# Patient Record
Sex: Male | Born: 1965 | Race: Black or African American | Hispanic: No | Marital: Single | Smoking: Never smoker
Health system: Southern US, Community
[De-identification: ages and names within clinical notes are randomized; demographics above are authoritative.]

## PROBLEM LIST (undated history)

## (undated) DIAGNOSIS — F1020 Alcohol dependence, uncomplicated: Secondary | ICD-10-CM

## (undated) DIAGNOSIS — M1990 Unspecified osteoarthritis, unspecified site: Secondary | ICD-10-CM

## (undated) DIAGNOSIS — F419 Anxiety disorder, unspecified: Secondary | ICD-10-CM

## (undated) DIAGNOSIS — F329 Major depressive disorder, single episode, unspecified: Secondary | ICD-10-CM

## (undated) DIAGNOSIS — K7460 Unspecified cirrhosis of liver: Secondary | ICD-10-CM

## (undated) DIAGNOSIS — K469 Unspecified abdominal hernia without obstruction or gangrene: Secondary | ICD-10-CM

## (undated) DIAGNOSIS — M4712 Other spondylosis with myelopathy, cervical region: Secondary | ICD-10-CM

## (undated) DIAGNOSIS — F32A Depression, unspecified: Secondary | ICD-10-CM

## (undated) HISTORY — DX: Unspecified osteoarthritis, unspecified site: M19.90

## (undated) HISTORY — PX: NECK SURGERY: SHX720

## (undated) HISTORY — DX: Anxiety disorder, unspecified: F41.9

## (undated) HISTORY — PX: LUNG SURGERY: SHX703

---
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
## 1998-03-10 ENCOUNTER — Emergency Department (HOSPITAL_COMMUNITY): Admission: EM | Admit: 1998-03-10 | Discharge: 1998-03-10 | Payer: Self-pay | Admitting: Emergency Medicine

## 1998-03-10 ENCOUNTER — Encounter: Payer: Self-pay | Admitting: Emergency Medicine

## 1998-03-15 ENCOUNTER — Emergency Department (HOSPITAL_COMMUNITY): Admission: EM | Admit: 1998-03-15 | Discharge: 1998-03-15 | Payer: Self-pay | Admitting: *Deleted

## 1998-07-28 ENCOUNTER — Encounter: Payer: Self-pay | Admitting: Emergency Medicine

## 1998-07-28 ENCOUNTER — Observation Stay (HOSPITAL_COMMUNITY): Admission: EM | Admit: 1998-07-28 | Discharge: 1998-07-28 | Payer: Self-pay | Admitting: Emergency Medicine

## 1998-09-09 ENCOUNTER — Encounter: Payer: Self-pay | Admitting: Emergency Medicine

## 1998-09-09 ENCOUNTER — Inpatient Hospital Stay (HOSPITAL_COMMUNITY): Admission: EM | Admit: 1998-09-09 | Discharge: 1998-09-10 | Payer: Self-pay | Admitting: Emergency Medicine

## 1998-09-10 ENCOUNTER — Encounter: Payer: Self-pay | Admitting: Oral & Maxillofacial Surgery

## 2001-01-21 ENCOUNTER — Encounter: Payer: Self-pay | Admitting: Emergency Medicine

## 2001-01-21 ENCOUNTER — Emergency Department (HOSPITAL_COMMUNITY): Admission: EM | Admit: 2001-01-21 | Discharge: 2001-01-21 | Payer: Self-pay | Admitting: Emergency Medicine

## 2001-09-11 ENCOUNTER — Encounter: Payer: Self-pay | Admitting: Emergency Medicine

## 2001-09-11 ENCOUNTER — Emergency Department (HOSPITAL_COMMUNITY): Admission: EM | Admit: 2001-09-11 | Discharge: 2001-09-11 | Payer: Self-pay | Admitting: Emergency Medicine

## 2003-08-04 ENCOUNTER — Emergency Department (HOSPITAL_COMMUNITY): Admission: EM | Admit: 2003-08-04 | Discharge: 2003-08-04 | Payer: Self-pay | Admitting: Emergency Medicine

## 2006-09-06 ENCOUNTER — Emergency Department (HOSPITAL_COMMUNITY): Admission: EM | Admit: 2006-09-06 | Discharge: 2006-09-06 | Payer: Self-pay | Admitting: Emergency Medicine

## 2012-09-26 ENCOUNTER — Encounter (HOSPITAL_COMMUNITY): Payer: Self-pay | Admitting: Emergency Medicine

## 2012-09-26 ENCOUNTER — Emergency Department (HOSPITAL_COMMUNITY)
Admission: EM | Admit: 2012-09-26 | Discharge: 2012-09-26 | Disposition: A | Discharge status: Home / Self Care | Payer: Self-pay | Attending: Emergency Medicine | Admitting: Emergency Medicine

## 2012-09-26 DIAGNOSIS — S51809A Unspecified open wound of unspecified forearm, initial encounter: Secondary | ICD-10-CM | POA: Insufficient documentation

## 2012-09-26 DIAGNOSIS — Y929 Unspecified place or not applicable: Secondary | ICD-10-CM | POA: Insufficient documentation

## 2012-09-26 DIAGNOSIS — W260XXA Contact with knife, initial encounter: Secondary | ICD-10-CM | POA: Insufficient documentation

## 2012-09-26 DIAGNOSIS — Z23 Encounter for immunization: Secondary | ICD-10-CM | POA: Insufficient documentation

## 2012-09-26 DIAGNOSIS — Y9389 Activity, other specified: Secondary | ICD-10-CM | POA: Insufficient documentation

## 2012-09-26 DIAGNOSIS — S51811A Laceration without foreign body of right forearm, initial encounter: Secondary | ICD-10-CM

## 2012-09-26 DIAGNOSIS — W261XXA Contact with sword or dagger, initial encounter: Secondary | ICD-10-CM | POA: Insufficient documentation

## 2012-09-26 DIAGNOSIS — F1010 Alcohol abuse, uncomplicated: Secondary | ICD-10-CM | POA: Insufficient documentation

## 2012-09-26 MED ORDER — TETANUS-DIPHTH-ACELL PERTUSSIS 5-2.5-18.5 LF-MCG/0.5 IM SUSP
0.5000 mL | Freq: Once | INTRAMUSCULAR | Status: AC
  Administered 2012-09-26: 0.5 mL via INTRAMUSCULAR
  Filled 2012-09-26: qty 0.5

## 2012-09-26 NOTE — ED Notes (Signed)
Suture cart at bedside. continued Pressure held to lac.

## 2012-09-26 NOTE — ED Notes (Signed)
Lab notified of need for blood
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
## 2013-01-04 ENCOUNTER — Emergency Department (HOSPITAL_COMMUNITY): Payer: Medicaid Other

## 2013-01-04 ENCOUNTER — Encounter (HOSPITAL_COMMUNITY): Payer: Self-pay

## 2013-01-04 ENCOUNTER — Inpatient Hospital Stay (HOSPITAL_COMMUNITY): Payer: Medicaid Other

## 2013-01-04 ENCOUNTER — Inpatient Hospital Stay (HOSPITAL_COMMUNITY)
Admission: EM | Admit: 2013-01-04 | Discharge: 2013-01-13 | DRG: 867 | Disposition: A | Discharge status: Home / Self Care | Payer: Medicaid Other | Attending: Internal Medicine | Admitting: Internal Medicine

## 2013-01-04 DIAGNOSIS — F1020 Alcohol dependence, uncomplicated: Secondary | ICD-10-CM | POA: Diagnosis present

## 2013-01-04 DIAGNOSIS — F1210 Cannabis abuse, uncomplicated: Secondary | ICD-10-CM | POA: Diagnosis present

## 2013-01-04 DIAGNOSIS — F10231 Alcohol dependence with withdrawal delirium: Secondary | ICD-10-CM | POA: Diagnosis present

## 2013-01-04 DIAGNOSIS — Z5900 Homelessness unspecified: Secondary | ICD-10-CM

## 2013-01-04 DIAGNOSIS — A029 Salmonella infection, unspecified: Principal | ICD-10-CM | POA: Diagnosis present

## 2013-01-04 DIAGNOSIS — K759 Inflammatory liver disease, unspecified: Secondary | ICD-10-CM | POA: Diagnosis present

## 2013-01-04 DIAGNOSIS — K7010 Alcoholic hepatitis without ascites: Secondary | ICD-10-CM | POA: Diagnosis present

## 2013-01-04 DIAGNOSIS — K861 Other chronic pancreatitis: Secondary | ICD-10-CM | POA: Diagnosis present

## 2013-01-04 DIAGNOSIS — K709 Alcoholic liver disease, unspecified: Secondary | ICD-10-CM

## 2013-01-04 DIAGNOSIS — E876 Hypokalemia: Secondary | ICD-10-CM | POA: Diagnosis present

## 2013-01-04 DIAGNOSIS — F1010 Alcohol abuse, uncomplicated: Secondary | ICD-10-CM | POA: Diagnosis present

## 2013-01-04 DIAGNOSIS — R7881 Bacteremia: Secondary | ICD-10-CM | POA: Diagnosis present

## 2013-01-04 DIAGNOSIS — R509 Fever, unspecified: Secondary | ICD-10-CM | POA: Diagnosis present

## 2013-01-04 DIAGNOSIS — D6959 Other secondary thrombocytopenia: Secondary | ICD-10-CM | POA: Diagnosis present

## 2013-01-04 DIAGNOSIS — E871 Hypo-osmolality and hyponatremia: Secondary | ICD-10-CM | POA: Diagnosis present

## 2013-01-04 DIAGNOSIS — W108XXA Fall (on) (from) other stairs and steps, initial encounter: Secondary | ICD-10-CM | POA: Diagnosis present

## 2013-01-04 DIAGNOSIS — K859 Acute pancreatitis without necrosis or infection, unspecified: Secondary | ICD-10-CM | POA: Diagnosis present

## 2013-01-04 DIAGNOSIS — F10931 Alcohol use, unspecified with withdrawal delirium: Secondary | ICD-10-CM | POA: Diagnosis present

## 2013-01-04 DIAGNOSIS — R6510 Systemic inflammatory response syndrome (SIRS) of non-infectious origin without acute organ dysfunction: Secondary | ICD-10-CM | POA: Diagnosis present

## 2013-01-04 DIAGNOSIS — K7460 Unspecified cirrhosis of liver: Secondary | ICD-10-CM | POA: Diagnosis present

## 2013-01-04 DIAGNOSIS — D696 Thrombocytopenia, unspecified: Secondary | ICD-10-CM | POA: Diagnosis present

## 2013-01-04 HISTORY — DX: Alcohol dependence, uncomplicated: F10.20

## 2013-01-04 LAB — RAPID URINE DRUG SCREEN, HOSP PERFORMED
Amphetamines: NOT DETECTED
Barbiturates: NOT DETECTED
Opiates: NOT DETECTED
Tetrahydrocannabinol: POSITIVE — AB

## 2013-01-04 LAB — URINALYSIS, ROUTINE W REFLEX MICROSCOPIC
Bilirubin Urine: NEGATIVE
Nitrite: NEGATIVE
Protein, ur: NEGATIVE mg/dL
Specific Gravity, Urine: 1.004 — ABNORMAL LOW (ref 1.005–1.030)
Urobilinogen, UA: 0.2 mg/dL (ref 0.0–1.0)

## 2013-01-04 LAB — CBC WITH DIFFERENTIAL/PLATELET
Eosinophils Relative: 0 % (ref 0–5)
HCT: 33.7 % — ABNORMAL LOW (ref 39.0–52.0)
Hemoglobin: 12 g/dL — ABNORMAL LOW (ref 13.0–17.0)
Lymphocytes Relative: 7 % — ABNORMAL LOW (ref 12–46)
Lymphs Abs: 0.5 10*3/uL — ABNORMAL LOW (ref 0.7–4.0)
MCV: 88 fL (ref 78.0–100.0)
Monocytes Absolute: 0.7 10*3/uL (ref 0.1–1.0)
Neutro Abs: 5.4 10*3/uL (ref 1.7–7.7)
RBC: 3.83 MIL/uL — ABNORMAL LOW (ref 4.22–5.81)
WBC: 6.6 10*3/uL (ref 4.0–10.5)

## 2013-01-04 LAB — CBC
MCH: 31.6 pg (ref 26.0–34.0)
MCHC: 35.5 g/dL (ref 30.0–36.0)
MCV: 89.1 fL (ref 78.0–100.0)
Platelets: 50 10*3/uL — ABNORMAL LOW (ref 150–400)

## 2013-01-04 LAB — COMPREHENSIVE METABOLIC PANEL
ALT: 141 U/L — ABNORMAL HIGH (ref 0–53)
AST: 752 U/L — ABNORMAL HIGH (ref 0–37)
CO2: 23 mEq/L (ref 19–32)
Calcium: 9.1 mg/dL (ref 8.4–10.5)
Chloride: 85 mEq/L — ABNORMAL LOW (ref 96–112)
Creatinine, Ser: 0.77 mg/dL (ref 0.50–1.35)
GFR calc Af Amer: >90 mL/min (ref >90)
GFR calc non Af Amer: >90 mL/min (ref >90)
Glucose, Bld: 127 mg/dL — ABNORMAL HIGH (ref 70–99)
Total Bilirubin: 2 mg/dL — ABNORMAL HIGH (ref 0.3–1.2)

## 2013-01-04 LAB — TYPE AND SCREEN
ABO/RH(D): O POS
Antibody Screen: NEGATIVE

## 2013-01-04 LAB — ETHANOL: Alcohol, Ethyl (B): <11 mg/dL (ref 0–11)

## 2013-01-04 LAB — MAGNESIUM
Magnesium: 1.9 mg/dL (ref 1.5–2.5)
Magnesium: 2 mg/dL (ref 1.5–2.5)

## 2013-01-04 LAB — CG4 I-STAT (LACTIC ACID): Lactic Acid, Venous: 2.93 mmol/L — ABNORMAL HIGH (ref 0.5–2.2)

## 2013-01-04 LAB — URINE MICROSCOPIC-ADD ON

## 2013-01-04 LAB — PROCALCITONIN: Procalcitonin: 2.36 ng/mL

## 2013-01-04 LAB — LACTIC ACID, PLASMA: Lactic Acid, Venous: 1.2 mmol/L (ref 0.5–2.2)

## 2013-01-04 LAB — PROTIME-INR: INR: 1.05 (ref 0.00–1.49)

## 2013-01-04 LAB — PHOSPHORUS: Phosphorus: 1.8 mg/dL — ABNORMAL LOW (ref 2.3–4.6)

## 2013-01-04 MED ORDER — PIPERACILLIN-TAZOBACTAM 3.375 G IVPB
3.3750 g | Freq: Once | INTRAVENOUS | Status: AC
  Administered 2013-01-04: 3.375 g via INTRAVENOUS
  Filled 2013-01-04 (×2): qty 50

## 2013-01-04 MED ORDER — LORAZEPAM 2 MG/ML IJ SOLN
1.0000 mg | Freq: Four times a day (QID) | INTRAMUSCULAR | Status: DC | PRN
  Administered 2013-01-05: 1 mg via INTRAVENOUS
  Filled 2013-01-04 (×2): qty 1

## 2013-01-04 MED ORDER — POTASSIUM CHLORIDE 10 MEQ/100ML IV SOLN
10.0000 meq | INTRAVENOUS | Status: AC
  Administered 2013-01-04 (×4): 10 meq via INTRAVENOUS
  Filled 2013-01-04 (×4): qty 100

## 2013-01-04 MED ORDER — SODIUM CHLORIDE 0.9 % IV BOLUS (SEPSIS): 1000.0000 mL | INTRAVENOUS | Status: DC | PRN

## 2013-01-04 MED ORDER — ADULT MULTIVITAMIN W/MINERALS CH
1.0000 | ORAL_TABLET | Freq: Once | ORAL | Status: AC
  Administered 2013-01-04: 1 via ORAL
  Filled 2013-01-04: qty 1

## 2013-01-04 MED ORDER — FOLIC ACID 1 MG PO TABS
1.0000 mg | ORAL_TABLET | Freq: Once | ORAL | Status: AC
  Administered 2013-01-04: 1 mg via ORAL
  Filled 2013-01-04: qty 1

## 2013-01-04 MED ORDER — SODIUM CHLORIDE 0.9 % IV SOLN
INTRAVENOUS | Status: AC
  Administered 2013-01-04 (×2): via INTRAVENOUS

## 2013-01-04 MED ORDER — ADULT MULTIVITAMIN W/MINERALS CH
1.0000 | ORAL_TABLET | Freq: Every day | ORAL | Status: DC
  Filled 2013-01-04: qty 1

## 2013-01-04 MED ORDER — VANCOMYCIN HCL IN DEXTROSE 1-5 GM/200ML-% IV SOLN
1000.0000 mg | Freq: Once | INTRAVENOUS | Status: AC
  Administered 2013-01-04: 21:00:00 1000 mg via INTRAVENOUS
  Filled 2013-01-04: qty 200

## 2013-01-04 MED ORDER — PIPERACILLIN-TAZOBACTAM 3.375 G IVPB 30 MIN
3.3750 g | Freq: Once | INTRAVENOUS | Status: DC
  Filled 2013-01-04: qty 50

## 2013-01-04 MED ORDER — ADULT MULTIVITAMIN W/MINERALS CH
1.0000 | ORAL_TABLET | Freq: Every day | ORAL | Status: DC
  Administered 2013-01-05 – 2013-01-13 (×9): 1 via ORAL
  Filled 2013-01-04 (×9): qty 1

## 2013-01-04 MED ORDER — IBUPROFEN 200 MG PO TABS
400.0000 mg | ORAL_TABLET | Freq: Once | ORAL | Status: AC
  Administered 2013-01-04: 400 mg via ORAL
  Filled 2013-01-04: qty 2

## 2013-01-04 MED ORDER — VITAMIN B-1 100 MG PO TABS
100.0000 mg | ORAL_TABLET | Freq: Every day | ORAL | Status: DC
  Administered 2013-01-05 – 2013-01-13 (×9): 100 mg via ORAL
  Filled 2013-01-04 (×10): qty 1

## 2013-01-04 MED ORDER — POTASSIUM CHLORIDE CRYS ER 20 MEQ PO TBCR
40.0000 meq | EXTENDED_RELEASE_TABLET | Freq: Once | ORAL | Status: AC
  Administered 2013-01-04: 40 meq via ORAL
  Filled 2013-01-04: qty 2

## 2013-01-04 MED ORDER — ENOXAPARIN SODIUM 40 MG/0.4ML ~~LOC~~ SOLN
40.0000 mg | SUBCUTANEOUS | Status: DC
  Administered 2013-01-04: 40 mg via SUBCUTANEOUS
  Filled 2013-01-04 (×2): qty 0.4

## 2013-01-04 MED ORDER — SODIUM CHLORIDE 0.9 % IV BOLUS (SEPSIS)
1000.0000 mL | Freq: Once | INTRAVENOUS | Status: AC
  Administered 2013-01-04: 1000 mL via INTRAVENOUS

## 2013-01-04 MED ORDER — PNEUMOCOCCAL VAC POLYVALENT 25 MCG/0.5ML IJ INJ
0.5000 mL | INJECTION | INTRAMUSCULAR | Status: AC
  Administered 2013-01-05: 10:00:00 0.5 mL via INTRAMUSCULAR
  Filled 2013-01-04: qty 0.5

## 2013-01-04 MED ORDER — FOLIC ACID 1 MG PO TABS
1.0000 mg | ORAL_TABLET | Freq: Every day | ORAL | Status: DC
  Filled 2013-01-04: qty 1

## 2013-01-04 MED ORDER — LORAZEPAM 1 MG PO TABS: 1.0000 mg | ORAL_TABLET | Freq: Four times a day (QID) | ORAL | Status: DC | PRN

## 2013-01-04 MED ORDER — VANCOMYCIN HCL IN DEXTROSE 1-5 GM/200ML-% IV SOLN
1000.0000 mg | Freq: Two times a day (BID) | INTRAVENOUS | Status: DC
Start: 2013-01-05 — End: 2013-01-05
  Administered 2013-01-05: 09:00:00 1000 mg via INTRAVENOUS
  Filled 2013-01-04 (×2): qty 200

## 2013-01-04 MED ORDER — THIAMINE HCL 100 MG/ML IJ SOLN
100.0000 mg | Freq: Once | INTRAMUSCULAR | Status: AC
  Administered 2013-01-04: 100 mg via INTRAVENOUS
  Filled 2013-01-04: qty 2

## 2013-01-04 MED ORDER — PIPERACILLIN-TAZOBACTAM 3.375 G IVPB
3.3750 g | Freq: Three times a day (TID) | INTRAVENOUS | Status: DC
  Administered 2013-01-04 – 2013-01-07 (×8): 3.375 g via INTRAVENOUS
  Filled 2013-01-04 (×10): qty 50

## 2013-01-04 MED ORDER — THIAMINE HCL 100 MG/ML IJ SOLN
100.0000 mg | Freq: Every day | INTRAMUSCULAR | Status: DC
  Administered 2013-01-04: 100 mg via INTRAVENOUS
  Filled 2013-01-04 (×2): qty 1

## 2013-01-04 MED ORDER — FOLIC ACID 1 MG PO TABS
1.0000 mg | ORAL_TABLET | Freq: Every day | ORAL | Status: DC
  Administered 2013-01-05 – 2013-01-13 (×9): 1 mg via ORAL
  Filled 2013-01-04 (×9): qty 1
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
## 2013-01-04 NOTE — ED Notes (Signed)
Per pt, went for a walk in park at 0700.  Pt began having weakness and tremors and went to a friends house.  Pt states he fell, but not sure what caused the fall.  Upon EMS arrival, pt was in bathroom attempting to get in shower.  EMS reports that pt was having tremors severely (to the point they considered requesting permission to give Ativan).  EMS reports pt's temp 100 and they administered NS bolus and 1000mg  Acetaminophen.  Pt admits daily alcohol use and drank his "normal amount" yesterday.

## 2013-01-04 NOTE — Progress Notes (Signed)
Theodore Brown [id number] Admission Data: 01/04/2013 6:43 PM Attending Provider: Drema Dallas, MD  PCP:No primary provider on file. Consults/ Treatment Team:    Theodore Brown is a 47 y.o. male patient admitted from ED awake, alert  & orientated  X 3,  No Order, VSS - Blood pressure 127/83, pulse 114, temperature 102.7 F (39.3 C), temperature source Oral, resp. rate 27, height 5\' 6"  (1.676 m), weight 58.968 kg (130 lb), SpO2 97.00%.,no c/o shortness of breath, no c/o chest pain, no distress noted. Tele #[id number] 06 placed and pt is currently running:sinus tachycardia.   IV site WDL:  forearm right, condition patent and no redness with a transparent dsg that's clean dry and intact.  Allergies:  No Known Allergies   Past Medical History  Diagnosis Date  . Alcoholic     History:  obtained from the patient. Tobacco/alcohol: denied > 5 beers per day(s)  Pt orientation to unit, room and routine. Information packet given to patient/family and safety video watched.  Admission INP armband ID verified with patient/family, and in place. SR up x 2, fall risk assessment complete with Patient and family verbalizing understanding of risks associated with falls. Pt verbalizes an understanding of how to use the call bell and to call for help before getting out of bed.  Skin, clean-dry- intact without evidence of bruising, or skin tears.   No evidence of skin break down noted on exam. no rashes, no ecchymoses, no petechiae    Will cont to monitor and assist as needed.  Theodore Biddy Consuella Lose, RN 01/04/2013 6:43 PM

## 2013-01-04 NOTE — ED Notes (Signed)
Pt advised that urine is needed 
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
## 2013-01-04 NOTE — Progress Notes (Signed)
ANTIBIOTIC CONSULT NOTE - INITIAL  Pharmacy Consult for Vancomycin and Zosyn Indication: rule out sepsis  No Known Allergies  Patient Measurements: Height: 5\' 6"  (167.6 cm) Weight: 130 lb (58.968 kg) IBW/kg (Calculated) : 63.8  Vital Signs: Temp: 102.7 F (39.3 C) (09/02 1602) Temp src: Oral (09/02 1602) BP: 127/83 mmHg (09/02 1530) Pulse Rate: 114 (09/02 1530)  Labs:  Recent Labs  01/04/13 1010  WBC 6.6  HGB 12.0*  PLT 51*  CREATININE 0.77   Estimated Creatinine Clearance: 96.3 ml/min (by C-G formula based on Cr of 0.77).  Microbiology:   9/2 - blood and urine cultures ordered  Medical History: Past Medical History  Diagnosis Date  . Alcoholic    Assessment:  47 yr old male admitted with fever to 103.1, rigors.   Weak/shaky while walking up stairs today; fell down stairs, but no injuries noted.  Zosyn 3.375 grams IV given in ED ~3pm.  To continue on Zosyn and add Vancomycin.  Goal of Therapy:  Vancomycin trough level 15-20 mcg/ml appropriate Zosyn dose for renal function and infection  Plan:    Zosyn 3.375 grams IV q8hrs (each infused over 4 hours).   Vancomycin 1 gram IV as ordered, then 1 gram IV q12hrs.   Will follow renal function, culture data and clinical progress.  Dennie Fetters, [place] Pager: [phone] 01/04/2013,7:19 PM

## 2013-01-05 DIAGNOSIS — D696 Thrombocytopenia, unspecified: Secondary | ICD-10-CM

## 2013-01-05 DIAGNOSIS — B9689 Other specified bacterial agents as the cause of diseases classified elsewhere: Secondary | ICD-10-CM

## 2013-01-05 DIAGNOSIS — R7881 Bacteremia: Secondary | ICD-10-CM

## 2013-01-05 LAB — CBC WITH DIFFERENTIAL/PLATELET
Eosinophils Relative: 0 % (ref 0–5)
HCT: 33.7 % — ABNORMAL LOW (ref 39.0–52.0)
Hemoglobin: 11.7 g/dL — ABNORMAL LOW (ref 13.0–17.0)
Lymphocytes Relative: 9 % — ABNORMAL LOW (ref 12–46)
Lymphs Abs: 0.5 10*3/uL — ABNORMAL LOW (ref 0.7–4.0)
MCV: 88.9 fL (ref 78.0–100.0)
Platelets: 47 10*3/uL — ABNORMAL LOW (ref 150–400)
RBC: 3.79 MIL/uL — ABNORMAL LOW (ref 4.22–5.81)
WBC: 5.8 10*3/uL (ref 4.0–10.5)

## 2013-01-05 LAB — COMPREHENSIVE METABOLIC PANEL
Albumin: 3.1 g/dL — ABNORMAL LOW (ref 3.5–5.2)
BUN: 7 mg/dL (ref 6–23)
Calcium: 9 mg/dL (ref 8.4–10.5)
Creatinine, Ser: 0.79 mg/dL (ref 0.50–1.35)
Total Bilirubin: 2.1 mg/dL — ABNORMAL HIGH (ref 0.3–1.2)
Total Protein: 6.9 g/dL (ref 6.0–8.3)

## 2013-01-05 LAB — URINALYSIS, ROUTINE W REFLEX MICROSCOPIC
Bilirubin Urine: NEGATIVE
Ketones, ur: 15 mg/dL — AB
Nitrite: NEGATIVE
Specific Gravity, Urine: 1.019 (ref 1.005–1.030)
Urobilinogen, UA: 0.2 mg/dL (ref 0.0–1.0)
pH: 5.5 (ref 5.0–8.0)

## 2013-01-05 LAB — URINE MICROSCOPIC-ADD ON

## 2013-01-05 LAB — CORTISOL: Cortisol, Plasma: 24.2 ug/dL

## 2013-01-05 LAB — HEPATITIS PANEL, ACUTE: HCV Ab: NEGATIVE

## 2013-01-05 LAB — ABO/RH: ABO/RH(D): O POS

## 2013-01-05 MED ORDER — LORAZEPAM 1 MG PO TABS
1.0000 mg | ORAL_TABLET | ORAL | Status: DC | PRN
  Filled 2013-01-05: qty 2

## 2013-01-05 MED ORDER — LORAZEPAM 2 MG/ML IJ SOLN
0.0000 mg | Freq: Four times a day (QID) | INTRAMUSCULAR | Status: DC
  Administered 2013-01-05 – 2013-01-06 (×3): 2 mg via INTRAVENOUS
  Filled 2013-01-05: qty 2
  Filled 2013-01-05 (×2): qty 1

## 2013-01-05 MED ORDER — POTASSIUM CHLORIDE 10 MEQ/100ML IV SOLN: 10.0000 meq | INTRAVENOUS | Status: DC

## 2013-01-05 MED ORDER — POTASSIUM CHLORIDE 10 MEQ/100ML IV SOLN
10.0000 meq | INTRAVENOUS | Status: AC
  Administered 2013-01-05 (×4): 10 meq via INTRAVENOUS
  Filled 2013-01-05 (×4): qty 100

## 2013-01-05 MED ORDER — LORAZEPAM 2 MG/ML IJ SOLN
1.0000 mg | INTRAMUSCULAR | Status: DC | PRN
  Administered 2013-01-05: 19:00:00 1 mg via INTRAVENOUS
  Administered 2013-01-06: 04:00:00 2 mg via INTRAVENOUS
  Filled 2013-01-05 (×3): qty 1

## 2013-01-05 MED ORDER — LORAZEPAM 1 MG PO TABS
1.0000 mg | ORAL_TABLET | ORAL | Status: DC
  Administered 2013-01-05: 19:00:00 1 mg via ORAL

## 2013-01-05 MED ORDER — POTASSIUM CHLORIDE CRYS ER 20 MEQ PO TBCR
40.0000 meq | EXTENDED_RELEASE_TABLET | Freq: Three times a day (TID) | ORAL | Status: DC
  Administered 2013-01-05 – 2013-01-07 (×4): 40 meq via ORAL
  Filled 2013-01-05 (×8): qty 2

## 2013-01-05 MED ORDER — LORAZEPAM 2 MG/ML IJ SOLN: 0.0000 mg | Freq: Two times a day (BID) | INTRAMUSCULAR | Status: DC

## 2013-01-05 MED ORDER — K PHOS MONO-SOD PHOS DI & MONO 155-852-130 MG PO TABS
250.0000 mg | ORAL_TABLET | Freq: Three times a day (TID) | ORAL | Status: DC
  Administered 2013-01-05 – 2013-01-13 (×24): 250 mg via ORAL
  Filled 2013-01-05 (×27): qty 1

## 2013-01-05 MED ORDER — LORAZEPAM 2 MG/ML IJ SOLN
3.0000 mg | INTRAMUSCULAR | Status: AC
  Administered 2013-01-05: 19:00:00 3 mg via INTRAVENOUS

## 2013-01-05 NOTE — Clinical Social Work Psychosocial (Signed)
Clinical Social Work Department BRIEF PSYCHOSOCIAL ASSESSMENT 01/05/2013  Patient:  Theodore Brown, Theodore Brown     Account Number:  [id number]     Admit date:  01/04/2013  Clinical Social Worker:  Lavell Luster  Date/Time:  01/05/2013 02:15 PM  Referred by:  Physician  Date Referred:  01/05/2013 Referred for  Substance Abuse   Other Referral:   Interview type:  Patient Other interview type:    PSYCHOSOCIAL DATA Living Status:  PARENTS Admitted from facility:   Level of care:   Primary support name:  Morton Stall Primary support relationship to patient:  PARENT Degree of support available:   CSW suspects that support is low.    CURRENT CONCERNS Current Concerns  Substance Abuse   Other Concerns:    SOCIAL WORK ASSESSMENT / PLAN CSW met with patient to discuss his alcohol use and treatment options. Patient has participated in AA in the past (about 10 years ago) and claims that he went to every meeting possible. When asked how much he drinks, patient stated "as much as I want", but states that he usually has between 4-6 beers. Patient states that he drinks when he can afford it. Initially patient was engaged in assessment and answered CSW's questions, but during SBIRT, patient became irritated and stated that "he doesn't need anyone to help him stop drinking." CSW ended assessment at this time and patient accepted the SA treatment list and AA meeting list.   Assessment/plan status:  No Further Intervention Required Other assessment/ plan:   Information/referral to community resources:   Treatment list and AA meeting schedule left with patient.    PATIENT'S/FAMILY'S RESPONSE TO PLAN OF CARE: Initially patient seemed interested in his treatment options, but patient became irritated during assessment. CSW chose to end assessment before completing SBIRT, but resources were discussed and left with patient. CSW signing off at this time.       Roddie Mc, [place],  [place], [phone]

## 2013-01-05 NOTE — Care Management Note (Signed)
    Page 1 of 1   01/13/2013     12:07:43 PM   CARE MANAGEMENT NOTE 01/13/2013  Patient:  Theodore Brown, Theodore Brown   Account Number:  [id number]  Date Initiated:  01/05/2013  Documentation initiated by:  Letha Cape  Subjective/Objective Assessment:   dx sirs  admit- lives with mother.  Pt will be going to a shelter at dc.     Action/Plan:   Anticipated DC Date:  01/13/2013   Anticipated DC Plan:  HOME W HOME HEALTH SERVICES      DC Planning Services  CM consult      Choice offered to / List presented to:             Status of service:  Completed, signed off Medicare Important Message given?   (If response is "NO", the following Medicare IM given date fields will be blank) Date Medicare IM given:   Date Additional Medicare IM given:    Discharge Disposition:  HOME/SELF CARE  Per UR Regulation:  Reviewed for med. necessity/level of care/duration of stay  If discussed at Long Length of Stay Meetings, dates discussed:    Comments:  01/12/13 16:48 Letha Cape RN, BSN [phone] patient for dc to shelter on 9/11.  01/07/13 16:09 Letha Cape RN, BSN [phone] patient states will need ast with meds, assited patient with [facility], also set patient up with [facility] for f/u apt and orange card ast.  Gave patient [employer] program letter today.  Patient states he has transportation at dc.  01/05/13 15:58 Letha Cape RN, BSN [phone] patient lives with mother, NCM will continue to follow for dc needs.

## 2013-01-05 NOTE — Progress Notes (Signed)
[facility] PROGRESS NOTE  Theodore Brown ZOX:[id number] DOB: [birth year] DOA: 01/04/2013 PCP: No primary provider on file.  47 yo male with PMH of alcohol abuse, substance abuse and chronic pancreatitis.  Presented after drinking beer for 3 days straight and falling down concrete stairs.  Assessment/Plan:  SIRS 2 of 2 Blood cultures positive for gram negative rods Urine culture pending Tmax 103.1 Patient appears surprisingly well. Vanc discontinued.  Patient remains on Zosyn per pharmacy.  Hyponatremia Likely induced by excessive beer intake Resolving on IVF.  Hypokalemia. Also likely due to GI losses from excessive beer intake Being repleted IV and PO Serum Mag 2.0  Acute on chronic pancreatitis  Patient tolerated clears.  Diet being advanced.  Pain free  Alcohol Abuse Patient acknowledges that he should quick drinking. Significant thrombocytopenia GGT and transaminases significantly elevated due to drinking On CIWA protocol. Social work consultation requested.   DVT Prophylaxis:  D/c lovenox due to platelets of 47,000  Code Status: full Family Communication: Disposition Plan: inpatient.  Consultants:    Procedures:    Antibiotics:  Vanc 9/2 - d/c'd 9/3  Zosyn 9/2 ->  HPI/Subjective: Patient reports he would like to go home.  No pain.  No further vomiting.  Requesting food.  Objective: Filed Vitals:   01/04/13 1530 01/04/13 1602 01/04/13 2112 01/05/13 0618  BP: 127/83 137/84 127/79 143/86  Pulse: 114 116 93 95  Temp:  102.7 F (39.3 C) 99.2 F (37.3 C) 99.3 F (37.4 C)  TempSrc:  Oral Oral Oral  Resp: 27 20 18 20   Height:      Weight:      SpO2: 97% 99% 97% 99%    Intake/Output Summary (Last 24 hours) at 01/05/13 1317 Last data filed at 01/05/13 0118  Gross per 24 hour  Intake 1052.08 ml  Output      0 ml  Net 1052.08 ml   Filed Weights   01/04/13 0857  Weight: 58.968 kg (130 lb)    Exam:   General:  Thin, alert,  pleasant AA male, sitting up in chair  Cardiovascular: RRR, no murmurs, rubs or gallops, no lower extremity edema  Respiratory: CTA, no wheeze, crackles, or rales.  No increased work of breathing.  Abdomen: Soft, non-tender, non-distended, + bowel sounds, no masses  Musculoskeletal: Able to move all 4 extremities, 5/5 strength in each  Data Reviewed: Basic Metabolic Panel:  Recent Labs Lab 01/04/13 1010 01/04/13 2048 01/04/13 2255 01/05/13 0550  NA 123*  --   --  130*  K 2.8*  --  3.2* 2.8*  CL 85*  --   --  96  CO2 23  --   --  20  GLUCOSE 127*  --   --  95  BUN 6  --   --  7  CREATININE 0.77  --   --  0.79  CALCIUM 9.1  --   --  9.0  MG  --  1.9 2.0  --   PHOS  --  1.8*  --   --    Liver Function Tests:  Recent Labs Lab 01/04/13 1010 01/05/13 0550  AST 752* 581*  ALT 141* 125*  ALKPHOS 142* 119*  BILITOT 2.0* 2.1*  PROT 7.5 6.9  ALBUMIN 3.5 3.1*    Recent Labs Lab 01/04/13 1010  LIPASE 136*   CBC:  Recent Labs Lab 01/04/13 1010 01/04/13 2048 01/05/13 0550  WBC 6.6 6.3 5.8  NEUTROABS 5.4  --  4.8  HGB 12.0* 11.6* 11.7*  HCT 33.7* 32.7* 33.7*  MCV 88.0 89.1 88.9  PLT 51* 50* 47*   Cardiac Enzymes:  Recent Labs Lab 01/04/13 2050  TROPONINI <0.30    Recent Results (from the past 240 hour(s))  CULTURE, BLOOD (ROUTINE X 2)     Status: None   Collection Time    01/04/13 10:10 AM      Result Value Range Status   Specimen Description BLOOD LEFT ANTECUBITAL   Final   Special Requests BOTTLES DRAWN AEROBIC ONLY [facility]   Final   Culture  Setup Time     Final   Value: 01/04/2013 14:46     Performed at [facility]   Culture     Final   Value: GRAM NEGATIVE RODS     0454 Note: Gram Stain Report Called to,Read Back By and Verified With: DORA GARDNER 01/05/2013 [facility]     Performed at [facility]   Report Status PENDING   Incomplete  CULTURE, BLOOD (ROUTINE X 2)     Status: None   Collection Time    01/04/13 10:20 AM      Result  Value Range Status   Specimen Description BLOOD HAND LEFT   Final   Special Requests BOTTLES DRAWN AEROBIC ONLY 3[facility]   Final   Culture  Setup Time     Final   Value: 01/04/2013 14:46     Performed at [facility]   Culture     Final   Value: GRAM NEGATIVE RODS     Note: Gram Stain Report Called to,Read Back By and Verified With: Marlaine Hind [id number] BRMEL     Performed at [facility]   Report Status PENDING   Incomplete     Studies: Dg Chest 2 View  01/04/2013   CLINICAL DATA:  Fever, dizziness, shortness of Breath.  EXAM: CHEST  2 VIEW  COMPARISON:  None.  FINDINGS: The heart size and mediastinal contours are within normal limits. Linear scarring in the lingula. Lungs otherwise clear. No effusions. The visualized skeletal structures are unremarkable.  IMPRESSION: No active cardiopulmonary disease.   Electronically Signed   By: Charlett Nose   On: 01/04/2013 09:26   Ct Head Wo Contrast  01/04/2013   *RADIOLOGY REPORT*  Clinical Data: Sudden onset weakness, shaking while walking up a flight of stairs  CT HEAD WITHOUT CONTRAST  Technique:  Contiguous axial images were obtained from the base of the skull through the vertex without contrast.  Comparison: None  Findings: Mild generalized atrophy. Normal ventricular morphology. No midline shift or mass effect. Otherwise normal appearance of brain parenchyma. No intracranial hemorrhage, mass lesion or evidence of acute infarction. No extra-axial fluid collections. Bones and sinuses unremarkable.  IMPRESSION: No acute intracranial abnormalities.   Original Report Authenticated By: Ulyses Southward, M.D.   US Abdomen Complete  01/04/2013   *RADIOLOGY REPORT*  Clinical Data:  Fever, elevated LFTs  COMPLETE ABDOMINAL ULTRASOUND  Comparison:  None.  Findings:  Gallbladder:  Contracted gallbladder.  No gallstones, gallbladder wall thickening, or pericholecystic fluid.  Negative sonographic Murphy's sign.  Common bile duct:  Measures 2 mm.   Liver:  No focal lesion identified.  Coarse, echogenic hepatic parenchyma.  Mildly nodular hepatic contour, equivocal.  IVC:  Appears normal.  Pancreas:  No focal abnormality seen.  Spleen:  Measures 7.3 cm.  Right Kidney:  Measures 10.8 cm.  No mass or hydronephrosis.  Left Kidney:  Measures 11.2 cm.  No mass or hydronephrosis.  Abdominal  aorta:  No aneurysm identified.  Additional comments:  Trace perihepatic ascites.  IMPRESSION: Mildly nodular hepatic contour with coarse parenchymal echogenicity and trace perihepatic ascites.  While hepatic steatosis is possible, early cirrhosis is not excluded.   Original Report Authenticated By: Charline Bills, M.D.    Scheduled Meds: . enoxaparin (LOVENOX) injection  40 mg Subcutaneous Q24H  . folic acid  1 mg Oral Daily  . multivitamin with minerals  1 tablet Oral Daily  . phosphorus  250 mg Oral TID  . piperacillin-tazobactam (ZOSYN)  IV  3.375 g Intravenous Q8H  . potassium chloride  10 mEq Intravenous Q1 Hr x 4  . thiamine  100 mg Oral Daily   Continuous Infusions:   Theodore Brown  [facility] Pager [phone]. If 7PM-7AM, please contact night-coverage at [email or web address], password [facility] 01/05/2013, 1:17 PM  LOS: 1 day   Attending note:  Patient interviewed and examined.  Agree with above. No abdominal pain.  No vomiting.  Doubt pancreatitis.  Await blood culture results.  Continue Zosyn.  Denies dysuria, cough, rash.  With thrombocytopenia, increased LFTs and nodular liver contour, likely has cirrhosis.  Acute hepatitis panel pending.  No evidence of withdrawal.  Replete potassium. Continue tele.  Crista Curb, M.D.

## 2013-01-06 DIAGNOSIS — F1020 Alcohol dependence, uncomplicated: Secondary | ICD-10-CM

## 2013-01-06 DIAGNOSIS — F10231 Alcohol dependence with withdrawal delirium: Secondary | ICD-10-CM | POA: Diagnosis not present

## 2013-01-06 LAB — COMPREHENSIVE METABOLIC PANEL
ALT: 106 U/L — ABNORMAL HIGH (ref 0–53)
Albumin: 3.1 g/dL — ABNORMAL LOW (ref 3.5–5.2)
Alkaline Phosphatase: 116 U/L (ref 39–117)
BUN: 8 mg/dL (ref 6–23)
Chloride: 95 mEq/L — ABNORMAL LOW (ref 96–112)
Potassium: 3.2 mEq/L — ABNORMAL LOW (ref 3.5–5.1)
Sodium: 128 mEq/L — ABNORMAL LOW (ref 135–145)
Total Bilirubin: 2.1 mg/dL — ABNORMAL HIGH (ref 0.3–1.2)

## 2013-01-06 LAB — CBC WITH DIFFERENTIAL/PLATELET
Basophils Absolute: 0 10*3/uL (ref 0.0–0.1)
Basophils Relative: 0 % (ref 0–1)
Eosinophils Absolute: 0 10*3/uL (ref 0.0–0.7)
Eosinophils Relative: 1 % (ref 0–5)
HCT: 31.4 % — ABNORMAL LOW (ref 39.0–52.0)
Lymphocytes Relative: 22 % (ref 12–46)
MCH: 31.9 pg (ref 26.0–34.0)
MCHC: 36.3 g/dL — ABNORMAL HIGH (ref 30.0–36.0)
MCV: 88 fL (ref 78.0–100.0)
Monocytes Absolute: 1 10*3/uL (ref 0.1–1.0)
Platelets: 51 10*3/uL — ABNORMAL LOW (ref 150–400)
RDW: 13.4 % (ref 11.5–15.5)
WBC: 6.2 10*3/uL (ref 4.0–10.5)

## 2013-01-06 LAB — URINE CULTURE
Colony Count: NO GROWTH
Culture: NO GROWTH
Special Requests: NORMAL

## 2013-01-06 LAB — CLOSTRIDIUM DIFFICILE BY PCR: Toxigenic C. Difficile by PCR: NEGATIVE

## 2013-01-06 MED ORDER — LORAZEPAM 1 MG PO TABS
1.0000 mg | ORAL_TABLET | ORAL | Status: DC | PRN
Start: 2013-01-06 — End: 2013-01-12
  Administered 2013-01-09 – 2013-01-12 (×3): 2 mg via ORAL
  Filled 2013-01-06: qty 1
  Filled 2013-01-06 (×3): qty 2

## 2013-01-06 MED ORDER — LORAZEPAM 1 MG PO TABS
4.0000 mg | ORAL_TABLET | ORAL | Status: DC
  Administered 2013-01-06 – 2013-01-07 (×5): 4 mg via ORAL
  Filled 2013-01-06 (×7): qty 4

## 2013-01-06 MED ORDER — LORAZEPAM 2 MG/ML IJ SOLN
1.0000 mg | INTRAMUSCULAR | Status: DC | PRN
  Administered 2013-01-06 – 2013-01-09 (×3): 2 mg via INTRAVENOUS
  Filled 2013-01-06 (×3): qty 1

## 2013-01-06 MED ORDER — ACETAMINOPHEN 325 MG PO TABS
650.0000 mg | ORAL_TABLET | Freq: Three times a day (TID) | ORAL | Status: DC | PRN
  Administered 2013-01-06: 650 mg via ORAL
  Filled 2013-01-06: qty 2

## 2013-01-06 MED ORDER — LORAZEPAM 2 MG/ML IJ SOLN: 4.0000 mg | INTRAMUSCULAR | Status: DC | PRN

## 2013-01-06 NOTE — Progress Notes (Signed)
[facility] PROGRESS NOTE  Theodore Brown ZOX:[id number] DOB: 1966-04-09 DOA: 01/04/2013 PCP: No primary provider on file.  47 yo male with PMH of alcohol abuse, substance abuse and chronic pancreatitis.  Presented after drinking beer for 3 days straight and falling down concrete stairs.  Assessment/Plan:  SIRS 2 of 2 Blood cultures positive for gram negative rods Urine culture pending Continue zosyn  Developed acute alcohol withdrawal overnight, required safety sitter and escalation of ativan.  Still confused and tremulous. Will increase ativan to 4 mg q 4h, with 1-2 mg hourly prn.  Hyponatremia Likely induced by excessive beer intake Resolving on IVF.  Hypokalemia. Improving. Continue repletion   DVT Prophylaxis:  D/c lovenox due to platelets of 47,000.  Too agitated and fall risk for SCDs  Code Status: full Family Communication: Disposition Plan: inpatient.  Consultants:    Procedures:    Antibiotics:  Vanc 9/2 - d/c'd 9/3  Zosyn 9/2 ->  HPI/Subjective: Unable. Per RN, agitated and uncooperative and confused all night  Objective: Filed Vitals:   01/04/13 2112 01/05/13 0618 01/05/13 1455 01/06/13 0657  BP:  143/86 146/89 149/89  Pulse: 93 95 114 111  Temp: 99.2 F (37.3 C) 99.3 F (37.4 C) 101.1 F (38.4 C) 102.8 F (39.3 C)  TempSrc: Oral Oral Oral Oral  Resp: 18 20 20 18   Height:      Weight:    49.578 kg (109 lb 4.8 oz)  SpO2: 97% 99% 100% 100%   No intake or output data in the 24 hours ending 01/06/13 0811 Filed Weights   01/04/13 0857 01/06/13 0657  Weight: 58.968 kg (130 lb) 49.578 kg (109 lb 4.8 oz)    Exam:   General:  Thin, alert, tremulous and disoreinted.  Cardiovascular: RRR, no murmurs, rubs or gallops, no lower extremity edema  Respiratory: CTA, no wheeze, crackles, or rales.  No increased work of breathing.  Abdomen: Soft, non-tender, non-distended, + bowel sounds, no masses  Musculoskeletal: Able to move all 4  extremities, 5/5 strength in each  Data Reviewed: Basic Metabolic Panel:  Recent Labs Lab 01/04/13 1010 01/04/13 2048 01/04/13 2255 01/05/13 0550 01/06/13 0442  NA 123*  --   --  130* 128*  K 2.8*  --  3.2* 2.8* 3.2*  CL 85*  --   --  96 95*  CO2 23  --   --  20 20  GLUCOSE 127*  --   --  95 96  BUN 6  --   --  7 8  CREATININE 0.77  --   --  0.79 0.87  CALCIUM 9.1  --   --  9.0 8.9  MG  --  1.9 2.0  --   --   PHOS  --  1.8*  --   --   --    Liver Function Tests:  Recent Labs Lab 01/04/13 1010 01/05/13 0550 01/06/13 0442  AST 752* 581* 410*  ALT 141* 125* 106*  ALKPHOS 142* 119* 116  BILITOT 2.0* 2.1* 2.1*  PROT 7.5 6.9 6.8  ALBUMIN 3.5 3.1* 3.1*    Recent Labs Lab 01/04/13 1010  LIPASE 136*   CBC:  Recent Labs Lab 01/04/13 1010 01/04/13 2048 01/05/13 0550 01/06/13 0442  WBC 6.6 6.3 5.8 6.2  NEUTROABS 5.4  --  4.8 3.8  HGB 12.0* 11.6* 11.7* 11.4*  HCT 33.7* 32.7* 33.7* 31.4*  MCV 88.0 89.1 88.9 88.0  PLT 51* 50* 47* 51*   Cardiac Enzymes:  Recent Labs Lab  01/04/13 2050  TROPONINI <0.30    Recent Results (from the past 240 hour(s))  CULTURE, BLOOD (ROUTINE X 2)     Status: None   Collection Time    01/04/13 10:10 AM      Result Value Range Status   Specimen Description BLOOD LEFT ANTECUBITAL   Final   Special Requests BOTTLES DRAWN AEROBIC ONLY [facility]   Final   Culture  Setup Time     Final   Value: 01/04/2013 14:46     Performed at [facility]   Culture     Final   Value: GRAM NEGATIVE RODS     2130 Note: Gram Stain Report Called to,Read Back By and Verified With: DORA GARDNER 01/05/2013 [facility]     Performed at [facility]   Report Status PENDING   Incomplete  CULTURE, BLOOD (ROUTINE X 2)     Status: None   Collection Time    01/04/13 10:20 AM      Result Value Range Status   Specimen Description BLOOD HAND LEFT   Final   Special Requests BOTTLES DRAWN AEROBIC ONLY 3[facility]   Final   Culture  Setup Time     Final   Value:  01/04/2013 14:46     Performed at [facility]   Culture     Final   Value: GRAM NEGATIVE RODS     Note: Gram Stain Report Called to,Read Back By and Verified With: Marlaine Hind [id number] BRMEL     Performed at [facility]   Report Status PENDING   Incomplete  CULTURE, BLOOD (ROUTINE X 2)     Status: None   Collection Time    01/04/13  8:40 PM      Result Value Range Status   Specimen Description BLOOD LEFT HAND   Final   Special Requests BOTTLES DRAWN AEROBIC ONLY 5[facility]   Final   Culture  Setup Time     Final   Value: 01/05/2013 02:42     Performed at [facility]   Culture     Final   Value: GRAM NEGATIVE RODS     Note: Gram Stain Report Called to,Read Back By and Verified With: AVERI B @ 1452 01/05/13 BY KRAWS     Performed at [facility]   Report Status PENDING   Incomplete  CULTURE, BLOOD (ROUTINE X 2)     Status: None   Collection Time    01/04/13  8:45 PM      Result Value Range Status   Specimen Description BLOOD LEFT ARM   Final   Special Requests BOTTLES DRAWN AEROBIC ONLY 5[facility]   Final   Culture  Setup Time     Final   Value: 01/05/2013 02:41     Performed at [facility]   Culture     Final   Value: GRAM NEGATIVE RODS     Note: Gram Stain Report Called to,Read Back By and Verified With: AVERI B @ 1452 01/05/13 BY KRAWS     Performed at [facility]   Report Status PENDING   Incomplete  URINE CULTURE     Status: None   Collection Time    01/05/13  7:16 AM      Result Value Range Status   Specimen Description URINE, RANDOM   Final   Special Requests Normal   Final   Culture  Setup Time     Final   Value: 01/05/2013 09:17  Performed at [facility] Count     Final   Value: NO GROWTH     Performed at [facility]   Culture     Final   Value: NO GROWTH     Performed at [facility]   Report Status 01/06/2013 FINAL   Final     Studies: Dg Chest 2 View  01/04/2013    CLINICAL DATA:  Fever, dizziness, shortness of Breath.  EXAM: CHEST  2 VIEW  COMPARISON:  None.  FINDINGS: The heart size and mediastinal contours are within normal limits. Linear scarring in the lingula. Lungs otherwise clear. No effusions. The visualized skeletal structures are unremarkable.  IMPRESSION: No active cardiopulmonary disease.   Electronically Signed   By: Charlett Nose   On: 01/04/2013 09:26   Ct Head Wo Contrast  01/04/2013   *RADIOLOGY REPORT*  Clinical Data: Sudden onset weakness, shaking while walking up a flight of stairs  CT HEAD WITHOUT CONTRAST  Technique:  Contiguous axial images were obtained from the base of the skull through the vertex without contrast.  Comparison: None  Findings: Mild generalized atrophy. Normal ventricular morphology. No midline shift or mass effect. Otherwise normal appearance of brain parenchyma. No intracranial hemorrhage, mass lesion or evidence of acute infarction. No extra-axial fluid collections. Bones and sinuses unremarkable.  IMPRESSION: No acute intracranial abnormalities.   Original Report Authenticated By: Ulyses Southward, M.D.   US Abdomen Complete  01/04/2013   *RADIOLOGY REPORT*  Clinical Data:  Fever, elevated LFTs  COMPLETE ABDOMINAL ULTRASOUND  Comparison:  None.  Findings:  Gallbladder:  Contracted gallbladder.  No gallstones, gallbladder wall thickening, or pericholecystic fluid.  Negative sonographic Murphy's sign.  Common bile duct:  Measures 2 mm.  Liver:  No focal lesion identified.  Coarse, echogenic hepatic parenchyma.  Mildly nodular hepatic contour, equivocal.  IVC:  Appears normal.  Pancreas:  No focal abnormality seen.  Spleen:  Measures 7.3 cm.  Right Kidney:  Measures 10.8 cm.  No mass or hydronephrosis.  Left Kidney:  Measures 11.2 cm.  No mass or hydronephrosis.  Abdominal aorta:  No aneurysm identified.  Additional comments:  Trace perihepatic ascites.  IMPRESSION: Mildly nodular hepatic contour with coarse parenchymal echogenicity and  trace perihepatic ascites.  While hepatic steatosis is possible, early cirrhosis is not excluded.   Original Report Authenticated By: Charline Bills, M.D.    Scheduled Meds: . folic acid  1 mg Oral Daily  . LORazepam  4 mg Oral Q4H  . multivitamin with minerals  1 tablet Oral Daily  . phosphorus  250 mg Oral TID  . piperacillin-tazobactam (ZOSYN)  IV  3.375 g Intravenous Q8H  . potassium chloride  40 mEq Oral TID  . thiamine  100 mg Oral Daily   Continuous Infusions:   Christiane Ha, MD  [facility] Pager [phone]. If 7PM-7AM, please contact night-coverage at [email or web address], password [facility] 01/06/2013, 8:11 AM  LOS: 2 days

## 2013-01-07 LAB — BASIC METABOLIC PANEL
BUN: 10 mg/dL (ref 6–23)
Calcium: 9.4 mg/dL (ref 8.4–10.5)
Creatinine, Ser: 0.93 mg/dL (ref 0.50–1.35)
GFR calc Af Amer: >90 mL/min (ref >90)
GFR calc non Af Amer: >90 mL/min (ref >90)

## 2013-01-07 LAB — HIV ANTIBODY (ROUTINE TESTING W REFLEX): HIV: NONREACTIVE

## 2013-01-07 MED ORDER — POTASSIUM CHLORIDE CRYS ER 20 MEQ PO TBCR
40.0000 meq | EXTENDED_RELEASE_TABLET | Freq: Every day | ORAL | Status: DC
  Administered 2013-01-07 – 2013-01-12 (×6): 40 meq via ORAL
  Filled 2013-01-07 (×6): qty 2

## 2013-01-07 MED ORDER — CIPROFLOXACIN IN D5W 400 MG/200ML IV SOLN
400.0000 mg | Freq: Two times a day (BID) | INTRAVENOUS | Status: DC
  Administered 2013-01-07 (×2): 400 mg via INTRAVENOUS
  Filled 2013-01-07 (×4): qty 200

## 2013-01-07 MED ORDER — LORAZEPAM 1 MG PO TABS
1.0000 mg | ORAL_TABLET | Freq: Four times a day (QID) | ORAL | Status: DC
  Administered 2013-01-07 – 2013-01-08 (×5): 1 mg via ORAL
  Filled 2013-01-07 (×5): qty 1

## 2013-01-07 NOTE — Progress Notes (Signed)
[facility] PROGRESS NOTE  Theodore Brown WUJ:[id number] DOB: 08-25-1965 DOA: 01/04/2013 PCP: No primary provider on file.  47 yo male with PMH of alcohol abuse, substance abuse and chronic pancreatitis.  Presented after drinking beer for 3 days straight and falling down concrete stairs.  Assessment/Plan:  Salmonella bacteremia:  Change abx to IV cipro.  Check HIV.  DTs: still tremulous and slightly confused, but requiring less ativan.  Will change to 1 q6h scheduled and continue q1h prn  Hypokalemia. Corrected  Thrombocytopenia likely from alcoholic liver disease, but checking HIV   DVT Prophylaxis:  D/c lovenox due to platelets of 47,000.  Too agitated and fall risk for SCDs  Code Status: full Family Communication: Disposition Plan: inpatient.  Consultants:    Procedures:    Antibiotics:  Vanc 9/2 - d/c'd 9/3  Zosyn 9/2 -> 9/5  cipro 9/5  HPI/Subjective: Unable.  Per RN, several scheduled doses of Ativan were held due to somnolence.  Objective: Filed Vitals:   01/06/13 0717 01/06/13 1130 01/06/13 2250 01/07/13 0637  BP: 159/99 128/85 137/88 111/78  Pulse: 145 115 107 102  Temp: 99.7 F (37.6 C) 102.1 F (38.9 C) 100.3 F (37.9 C) 99.5 F (37.5 C)  TempSrc: Oral Oral Oral Oral  Resp:  22 18 16   Height:      Weight:      SpO2: 100% 96% 99% 97%    Intake/Output Summary (Last 24 hours) at 01/07/13 1117 Last data filed at 01/07/13 1020  Gross per 24 hour  Intake    480 ml  Output    301 ml  Net    179 ml   Filed Weights   01/04/13 0857 01/06/13 0657  Weight: 58.968 kg (130 lb) 49.578 kg (109 lb 4.8 oz)    Exam:   General:  Thin, alert, tremulous oriented to place person, not time. Still somewhat inappropriate but much less agitated  Cardiovascular: RRR, no murmurs, rubs or gallops, no lower extremity edema  Respiratory: CTA, no wheeze, crackles, or rales.  No increased work of breathing.  Abdomen: Soft, non-tender, non-distended, +  bowel sounds, no masses  Musculoskeletal: Able to move all 4 extremities, 5/5 strength in each  Data Reviewed: Basic Metabolic Panel:  Recent Labs Lab 01/04/13 1010 01/04/13 2048 01/04/13 2255 01/05/13 0550 01/06/13 0442 01/07/13 0442  NA 123*  --   --  130* 128* 132*  K 2.8*  --  3.2* 2.8* 3.2* 3.9  CL 85*  --   --  96 95* 99  CO2 23  --   --  20 20 21   GLUCOSE 127*  --   --  95 96 92  BUN 6  --   --  7 8 10   CREATININE 0.77  --   --  0.79 0.87 0.93  CALCIUM 9.1  --   --  9.0 8.9 9.4  MG  --  1.9 2.0  --   --   --   PHOS  --  1.8*  --   --   --   --    Liver Function Tests:  Recent Labs Lab 01/04/13 1010 01/05/13 0550 01/06/13 0442  AST 752* 581* 410*  ALT 141* 125* 106*  ALKPHOS 142* 119* 116  BILITOT 2.0* 2.1* 2.1*  PROT 7.5 6.9 6.8  ALBUMIN 3.5 3.1* 3.1*    Recent Labs Lab 01/04/13 1010  LIPASE 136*   CBC:  Recent Labs Lab 01/04/13 1010 01/04/13 2048 01/05/13 0550 01/06/13 0442  WBC  6.6 6.3 5.8 6.2  NEUTROABS 5.4  --  4.8 3.8  HGB 12.0* 11.6* 11.7* 11.4*  HCT 33.7* 32.7* 33.7* 31.4*  MCV 88.0 89.1 88.9 88.0  PLT 51* 50* 47* 51*   Cardiac Enzymes:  Recent Labs Lab 01/04/13 2050  TROPONINI <0.30    Recent Results (from the past 240 hour(s))  CULTURE, BLOOD (ROUTINE X 2)     Status: None   Collection Time    01/04/13 10:10 AM      Result Value Range Status   Specimen Description BLOOD LEFT ANTECUBITAL   Final   Special Requests BOTTLES DRAWN AEROBIC ONLY [facility]   Final   Culture  Setup Time     Final   Value: 01/04/2013 14:46     Performed at [facility]   Culture     Final   Value: SALMONELLA SPECIES     Note: SUSCEPTIBILITIES PERFORMED ON PREVIOUS CULTURE WITHIN THE LAST 5 DAYS. CRITICAL RESULT CALLED TO, READ BACK BY AND VERIFIED WITH: JESSIE WESSELINK @ 1610 01/07/13 BY KRAWS     0241 Note: Gram Stain Report Called to,Read Back By and Verified With: DORA GARDNER 01/05/2013 [facility]     Performed at [facility]   Report  Status PENDING   Incomplete  CULTURE, BLOOD (ROUTINE X 2)     Status: None   Collection Time    01/04/13 10:20 AM      Result Value Range Status   Specimen Description BLOOD HAND LEFT   Final   Special Requests BOTTLES DRAWN AEROBIC ONLY [facility]   Final   Culture  Setup Time     Final   Value: 01/04/2013 14:46     Performed at [facility]   Culture     Final   Value: SALMONELLA SPECIES     Note: SUSCEPTIBILITIES PERFORMED ON PREVIOUS CULTURE WITHIN THE LAST 5 DAYS. CRITICAL RESULT CALLED TO, READ BACK BY AND VERIFIED WITH: JESSIE Lolita Patella @ 9604 01/07/13 BY KRAWS     Note: Gram Stain Report Called to,Read Back By and Verified With: Marlaine Hind [id number] BRMEL     Performed at [facility]   Report Status PENDING   Incomplete  CULTURE, BLOOD (ROUTINE X 2)     Status: None   Collection Time    01/04/13  8:40 PM      Result Value Range Status   Specimen Description BLOOD LEFT HAND   Final   Special Requests BOTTLES DRAWN AEROBIC ONLY 5[facility]   Final   Culture  Setup Time     Final   Value: 01/05/2013 02:42     Performed at [facility]   Culture     Final   Value: SALMONELLA SPECIES     Note: CRITICAL RESULT CALLED TO, READ BACK BY AND VERIFIED WITH: JESSIE Lolita Patella @ 1478 01/07/13 BY KRAWS     Note: Gram Stain Report Called to,Read Back By and Verified With: AVERI B @ 1452 01/05/13 BY KRAWS     Performed at [facility]   Report Status PENDING   Incomplete   Organism ID, Bacteria SALMONELLA SPECIES   Final  CULTURE, BLOOD (ROUTINE X 2)     Status: None   Collection Time    01/04/13  8:45 PM      Result Value Range Status   Specimen Description BLOOD LEFT ARM   Final   Special Requests BOTTLES DRAWN AEROBIC ONLY 5[facility]   Final   Culture  Setup Time     Final   Value: 01/05/2013 02:41     Performed at [facility]   Culture     Final   Value: SALMONELLA SPECIES     Note: SUSCEPTIBILITIES PERFORMED ON PREVIOUS CULTURE WITHIN THE LAST 5 DAYS.  CRITICAL RESULT CALLED TO, READ BACK BY AND VERIFIED WITH: Trudee Grip @ 4782 01/07/13 BY KRAWS     Note: Gram Stain Report Called to,Read Back By and Verified With: AVERI B @ 1452 01/05/13 BY KRAWS     Performed at [facility]   Report Status PENDING   Incomplete  URINE CULTURE     Status: None   Collection Time    01/05/13  7:16 AM      Result Value Range Status   Specimen Description URINE, RANDOM   Final   Special Requests Normal   Final   Culture  Setup Time     Final   Value: 01/05/2013 09:17     Performed at [facility]     Final   Value: NO GROWTH     Performed at [facility]   Culture     Final   Value: NO GROWTH     Performed at [facility]   Report Status 01/06/2013 FINAL   Final  CLOSTRIDIUM DIFFICILE BY PCR     Status: None   Collection Time    01/06/13  2:12 PM      Result Value Range Status   C difficile by pcr NEGATIVE  NEGATIVE Final     Studies: No results found.  Scheduled Meds: . ciprofloxacin  400 mg Intravenous Q12H  . folic acid  1 mg Oral Daily  . LORazepam  1 mg Oral Q6H  . multivitamin with minerals  1 tablet Oral Daily  . phosphorus  250 mg Oral TID  . potassium chloride  40 mEq Oral Daily  . thiamine  100 mg Oral Daily   Continuous Infusions:   Theodore Ha, MD  [facility] Pager [phone]. If 7PM-7AM, please contact night-coverage at [email or web address], password [facility] 01/07/2013, 11:17 AM  LOS: 3 days

## 2013-01-08 MED ORDER — LORAZEPAM 1 MG PO TABS
2.0000 mg | ORAL_TABLET | ORAL | Status: DC
  Administered 2013-01-08 – 2013-01-09 (×4): 2 mg via ORAL
  Filled 2013-01-08 (×4): qty 2

## 2013-01-08 MED ORDER — CIPROFLOXACIN HCL 500 MG PO TABS
500.0000 mg | ORAL_TABLET | Freq: Two times a day (BID) | ORAL | Status: DC
  Administered 2013-01-08 – 2013-01-09 (×3): 500 mg via ORAL
  Filled 2013-01-08 (×5): qty 1

## 2013-01-08 NOTE — Progress Notes (Signed)
[facility] PROGRESS NOTE  Theodore Brown FAO:[id number] DOB: 11-19-1965 DOA: 01/04/2013 PCP: No primary provider on file.  47 yo male with PMH of alcohol abuse, substance abuse and chronic pancreatitis.  Presented after drinking beer for 3 days straight and falling down concrete stairs.  Assessment/Plan:  Salmonella bacteremia:  Change to PO cipro.  DTs: still tremulous and slightly confused, but requiring less ativan.  Continue ativan and monitor for 24 -48 hours  Thrombocytopenia due to cirrhosis. HIV negative   DVT Prophylaxis:  D/c lovenox due to platelets of 47,000.    Code Status: full Family Communication: Disposition Plan: inpatient.  Consultants:    Procedures:    Antibiotics:  Vanc 9/2 - d/c'd 9/3  Zosyn 9/2 -> 9/5  cipro 9/5  HPI/Subjective: Ate chicken salad at a barbecue on Labor day.  Objective: Filed Vitals:   01/07/13 2137 01/08/13 0600 01/08/13 1148 01/08/13 1522  BP: 108/76 101/74 104/75 103/70  Pulse: 86 84 82 97  Temp: 98.8 F (37.1 C) 98.4 F (36.9 C)  98.9 F (37.2 C)  TempSrc: Oral Oral  Oral  Resp: 18 18  18   Height:      Weight:      SpO2: 96% 99%  99%    Intake/Output Summary (Last 24 hours) at 01/08/13 1838 Last data filed at 01/08/13 0950  Gross per 24 hour  Intake    120 ml  Output      0 ml  Net    120 ml   Filed Weights   01/04/13 0857 01/06/13 0657  Weight: 58.968 kg (130 lb) 49.578 kg (109 lb 4.8 oz)    Exam:   General:  Alert, appropriate. Eating breakfast without tremulousness  Cardiovascular: RRR, no murmurs, rubs or gallops, no lower extremity edema  Respiratory: CTA, no wheeze, crackles, or rales.  No increased work of breathing.  Abdomen: Soft, non-tender, non-distended, + bowel sounds, no masses  Musculoskeletal: Able to move all 4 extremities, 5/5 strength in each  Data Reviewed: Basic Metabolic Panel:  Recent Labs Lab 01/04/13 1010 01/04/13 2048 01/04/13 2255 01/05/13 0550  01/06/13 0442 01/07/13 0442  NA 123*  --   --  130* 128* 132*  K 2.8*  --  3.2* 2.8* 3.2* 3.9  CL 85*  --   --  96 95* 99  CO2 23  --   --  20 20 21   GLUCOSE 127*  --   --  95 96 92  BUN 6  --   --  7 8 10   CREATININE 0.77  --   --  0.79 0.87 0.93  CALCIUM 9.1  --   --  9.0 8.9 9.4  MG  --  1.9 2.0  --   --   --   PHOS  --  1.8*  --   --   --   --    Liver Function Tests:  Recent Labs Lab 01/04/13 1010 01/05/13 0550 01/06/13 0442  AST 752* 581* 410*  ALT 141* 125* 106*  ALKPHOS 142* 119* 116  BILITOT 2.0* 2.1* 2.1*  PROT 7.5 6.9 6.8  ALBUMIN 3.5 3.1* 3.1*    Recent Labs Lab 01/04/13 1010  LIPASE 136*   CBC:  Recent Labs Lab 01/04/13 1010 01/04/13 2048 01/05/13 0550 01/06/13 0442  WBC 6.6 6.3 5.8 6.2  NEUTROABS 5.4  --  4.8 3.8  HGB 12.0* 11.6* 11.7* 11.4*  HCT 33.7* 32.7* 33.7* 31.4*  MCV 88.0 89.1 88.9 88.0  PLT 51* 50*  47* 51*   Cardiac Enzymes:  Recent Labs Lab 01/04/13 2050  TROPONINI <0.30    Recent Results (from the past 240 hour(s))  CULTURE, BLOOD (ROUTINE X 2)     Status: None   Collection Time    01/04/13 10:10 AM      Result Value Range Status   Specimen Description BLOOD LEFT ANTECUBITAL   Final   Special Requests BOTTLES DRAWN AEROBIC ONLY [facility]   Final   Culture  Setup Time     Final   Value: 01/04/2013 14:46     Performed at [facility]   Culture     Final   Value: SALMONELLA SPECIES     Note: SUSCEPTIBILITIES PERFORMED ON PREVIOUS CULTURE WITHIN THE LAST 5 DAYS. CRITICAL RESULT CALLED TO, READ BACK BY AND VERIFIED WITH: JESSIE WESSELINK @ 1610 01/07/13 BY KRAWS     0241 Note: Gram Stain Report Called to,Read Back By and Verified With: DORA GARDNER 01/05/2013 [facility]     Performed at [facility]   Report Status PENDING   Incomplete  CULTURE, BLOOD (ROUTINE X 2)     Status: None   Collection Time    01/04/13 10:20 AM      Result Value Range Status   Specimen Description BLOOD HAND LEFT   Final   Special Requests  BOTTLES DRAWN AEROBIC ONLY 3[facility]   Final   Culture  Setup Time     Final   Value: 01/04/2013 14:46     Performed at [facility]   Culture     Final   Value: SALMONELLA SPECIES     Note: SUSCEPTIBILITIES PERFORMED ON PREVIOUS CULTURE WITHIN THE LAST 5 DAYS. CRITICAL RESULT CALLED TO, READ BACK BY AND VERIFIED WITH: JESSIE Lolita Patella @ 9604 01/07/13 BY KRAWS     Note: Gram Stain Report Called to,Read Back By and Verified With: Marlaine Hind [id number] BRMEL     Performed at [facility]   Report Status PENDING   Incomplete  CULTURE, BLOOD (ROUTINE X 2)     Status: None   Collection Time    01/04/13  8:40 PM      Result Value Range Status   Specimen Description BLOOD LEFT HAND   Final   Special Requests BOTTLES DRAWN AEROBIC ONLY 5[facility]   Final   Culture  Setup Time     Final   Value: 01/05/2013 02:42     Performed at [facility]   Culture     Final   Value: SALMONELLA SPECIES     Note: CRITICAL RESULT CALLED TO, READ BACK BY AND VERIFIED WITH: JESSIE Lolita Patella @ 1478 01/07/13 BY KRAWS     Note: Gram Stain Report Called to,Read Back By and Verified With: AVERI B @ 1452 01/05/13 BY KRAWS     Performed at [facility]   Report Status PENDING   Incomplete   Organism ID, Bacteria SALMONELLA SPECIES   Final  CULTURE, BLOOD (ROUTINE X 2)     Status: None   Collection Time    01/04/13  8:45 PM      Result Value Range Status   Specimen Description BLOOD LEFT ARM   Final   Special Requests BOTTLES DRAWN AEROBIC ONLY 5[facility]   Final   Culture  Setup Time     Final   Value: 01/05/2013 02:41     Performed at [facility]   Culture     Final   Value: SALMONELLA  SPECIES     Note: SUSCEPTIBILITIES PERFORMED ON PREVIOUS CULTURE WITHIN THE LAST 5 DAYS. CRITICAL RESULT CALLED TO, READ BACK BY AND VERIFIED WITH: Trudee Grip @ 1610 01/07/13 BY KRAWS     Note: Gram Stain Report Called to,Read Back By and Verified With: AVERI B @ 1452 01/05/13 BY KRAWS     Performed at  [facility]   Report Status PENDING   Incomplete  URINE CULTURE     Status: None   Collection Time    01/05/13  7:16 AM      Result Value Range Status   Specimen Description URINE, RANDOM   Final   Special Requests Normal   Final   Culture  Setup Time     Final   Value: 01/05/2013 09:17     Performed at [facility]     Final   Value: NO GROWTH     Performed at [facility]   Culture     Final   Value: NO GROWTH     Performed at [facility]   Report Status 01/06/2013 FINAL   Final  CLOSTRIDIUM DIFFICILE BY PCR     Status: None   Collection Time    01/06/13  2:12 PM      Result Value Range Status   C difficile by pcr NEGATIVE  NEGATIVE Final     Studies: No results found.  Scheduled Meds: . ciprofloxacin  500 mg Oral BID  . folic acid  1 mg Oral Daily  . LORazepam  2 mg Oral Q4H  . multivitamin with minerals  1 tablet Oral Daily  . phosphorus  250 mg Oral TID  . potassium chloride  40 mEq Oral Daily  . thiamine  100 mg Oral Daily   Continuous Infusions:   Christiane Ha, MD  [facility] Pager [phone]. If 7PM-7AM, please contact night-coverage at [email or web address], password [facility] 01/08/2013, 6:38 PM  LOS: 4 days

## 2013-01-08 NOTE — Progress Notes (Signed)
Patient refusing to have bed alarm or chair alarm on.  "I might as well be locked up", "should not have to ask permission to get up".  Explained to patient that it was a safety measure to keep him safe.

## 2013-01-09 DIAGNOSIS — K709 Alcoholic liver disease, unspecified: Secondary | ICD-10-CM | POA: Diagnosis present

## 2013-01-09 LAB — CBC WITH DIFFERENTIAL/PLATELET
Basophils Absolute: 0 10*3/uL (ref 0.0–0.1)
Basophils Relative: 1 % (ref 0–1)
Eosinophils Absolute: 0 10*3/uL (ref 0.0–0.7)
Eosinophils Relative: 1 % (ref 0–5)
Lymphs Abs: 1.6 10*3/uL (ref 0.7–4.0)
MCH: 31.3 pg (ref 26.0–34.0)
MCHC: 35.6 g/dL (ref 30.0–36.0)
MCV: 87.9 fL (ref 78.0–100.0)
Neutro Abs: 1.3 10*3/uL — ABNORMAL LOW (ref 1.7–7.7)
Neutrophils Relative %: 34 % — ABNORMAL LOW (ref 43–77)
Platelets: 163 10*3/uL (ref 150–400)
RBC: 3.48 MIL/uL — ABNORMAL LOW (ref 4.22–5.81)
RDW: 14.1 % (ref 11.5–15.5)

## 2013-01-09 LAB — COMPREHENSIVE METABOLIC PANEL
AST: 248 U/L — ABNORMAL HIGH (ref 0–37)
Alkaline Phosphatase: 173 U/L — ABNORMAL HIGH (ref 39–117)
CO2: 23 mEq/L (ref 19–32)
Chloride: 99 mEq/L (ref 96–112)
Creatinine, Ser: 0.77 mg/dL (ref 0.50–1.35)
GFR calc non Af Amer: >90 mL/min (ref >90)
Potassium: 3.8 mEq/L (ref 3.5–5.1)
Total Bilirubin: 1.4 mg/dL — ABNORMAL HIGH (ref 0.3–1.2)

## 2013-01-09 LAB — AMMONIA: Ammonia: 33 umol/L (ref 11–60)

## 2013-01-09 MED ORDER — ENOXAPARIN SODIUM 40 MG/0.4ML ~~LOC~~ SOLN
40.0000 mg | SUBCUTANEOUS | Status: DC
  Administered 2013-01-09 – 2013-01-13 (×3): 40 mg via SUBCUTANEOUS
  Filled 2013-01-09 (×5): qty 0.4

## 2013-01-09 MED ORDER — LORAZEPAM 1 MG PO TABS
4.0000 mg | ORAL_TABLET | ORAL | Status: DC
  Administered 2013-01-09 – 2013-01-11 (×14): 4 mg via ORAL
  Filled 2013-01-09 (×14): qty 4

## 2013-01-09 MED ORDER — SULFAMETHOXAZOLE-TMP DS 800-160 MG PO TABS
1.0000 | ORAL_TABLET | Freq: Two times a day (BID) | ORAL | Status: DC
  Administered 2013-01-09 – 2013-01-13 (×9): 1 via ORAL
  Filled 2013-01-09 (×10): qty 1

## 2013-01-09 MED ORDER — LORAZEPAM 1 MG PO TABS
3.0000 mg | ORAL_TABLET | ORAL | Status: DC
  Administered 2013-01-09 (×3): 3 mg via ORAL
  Filled 2013-01-09 (×3): qty 3

## 2013-01-09 NOTE — Progress Notes (Addendum)
[facility] PROGRESS NOTE  Theodore Brown:[id number] DOB: 1965-11-19 DOA: 01/04/2013 PCP: No primary provider on file.  47 yo male with PMH of alcohol abuse, substance abuse and chronic pancreatitis.  Presented after drinking beer for 3 days straight and falling down concrete stairs.  Assessment/Plan:  Salmonella bacteremia:  See below  DTs: confused again.  Will change ativan to 3 mg q3h scheduled.  Not really tremulous. May also be related to cipro? Change to bactrim  Thrombocytopenia corrected! Start lovenox  Alcohol dependence:  Long discussion with NOK, mother.  Pt is homeless.  She is requesting inpatient alcohol treatement if patient agrees.     alcoholic liver disease: LFTs improving  DVT Prophylaxis:    Code Status: full Family Communication: Disposition Plan: inpatient.  Consultants:    Procedures:    Antibiotics:  Vanc 9/2 - d/c'd 9/3  Zosyn 9/2 -> 9/5  cipro 9/5 - 9/7  bactrim 9/7  HPI/Subjective: Wants to go home.  Per RN, confused all night  Objective: Filed Vitals:   01/08/13 1148 01/08/13 1522 01/08/13 2222 01/09/13 0621  BP: 104/75 103/70 109/78 114/76  Pulse: 82 97 83 88  Temp:  98.9 F (37.2 C) 98.5 F (36.9 C) 98.8 F (37.1 C)  TempSrc:  Oral Oral Oral  Resp:  18 15 15   Height:      Weight:      SpO2:  99% 100% 100%    Intake/Output Summary (Last 24 hours) at 01/09/13 0829 Last data filed at 01/08/13 0950  Gross per 24 hour  Intake    120 ml  Output      0 ml  Net    120 ml   Filed Weights   01/04/13 0857 01/06/13 0657  Weight: 58.968 kg (130 lb) 49.578 kg (109 lb 4.8 oz)    Exam:   General:  Groggy. Sitting at the edge of the bed. Disoriented to time and place and situation. Answers questions and follows commands. Not tremulous  Cardiovascular: RRR, no murmurs, rubs or gallops, no lower extremity edema  Respiratory: CTA, no wheeze, crackles, or rales.  No increased work of breathing.  Abdomen: Soft,  non-tender, non-distended, + bowel sounds, no masses  Ext: no CCE  Data Reviewed: Basic Metabolic Panel:  Recent Labs Lab 01/04/13 1010 01/04/13 2048 01/04/13 2255 01/05/13 0550 01/06/13 0442 01/07/13 0442 01/09/13 0521  NA 123*  --   --  130* 128* 132* 133*  K 2.8*  --  3.2* 2.8* 3.2* 3.9 3.8  CL 85*  --   --  96 95* 99 99  CO2 23  --   --  20 20 21 23   GLUCOSE 127*  --   --  95 96 92 94  BUN 6  --   --  7 8 10 11   CREATININE 0.77  --   --  0.79 0.87 0.93 0.77  CALCIUM 9.1  --   --  9.0 8.9 9.4 9.5  MG  --  1.9 2.0  --   --   --   --   PHOS  --  1.8*  --   --   --   --   --    Liver Function Tests:  Recent Labs Lab 01/04/13 1010 01/05/13 0550 01/06/13 0442 01/09/13 0521  AST 752* 581* 410* 248*  ALT 141* 125* 106* 111*  ALKPHOS 142* 119* 116 173*  BILITOT 2.0* 2.1* 2.1* 1.4*  PROT 7.5 6.9 6.8 7.0  ALBUMIN 3.5 3.1* 3.1*  3.0*    Recent Labs Lab 01/04/13 1010  LIPASE 136*   CBC:  Recent Labs Lab 01/04/13 1010 01/04/13 2048 01/05/13 0550 01/06/13 0442 01/09/13 0521  WBC 6.6 6.3 5.8 6.2 3.8*  NEUTROABS 5.4  --  4.8 3.8 1.3*  HGB 12.0* 11.6* 11.7* 11.4* 10.9*  HCT 33.7* 32.7* 33.7* 31.4* 30.6*  MCV 88.0 89.1 88.9 88.0 87.9  PLT 51* 50* 47* 51* 163   Cardiac Enzymes:  Recent Labs Lab 01/04/13 2050  TROPONINI <0.30    Recent Results (from the past 240 hour(s))  CULTURE, BLOOD (ROUTINE X 2)     Status: None   Collection Time    01/04/13 10:10 AM      Result Value Range Status   Specimen Description BLOOD LEFT ANTECUBITAL   Final   Special Requests BOTTLES DRAWN AEROBIC ONLY [facility]   Final   Culture  Setup Time     Final   Value: 01/04/2013 14:46     Performed at [facility]   Culture     Final   Value: SALMONELLA SPECIES     Note: SUSCEPTIBILITIES PERFORMED ON PREVIOUS CULTURE WITHIN THE LAST 5 DAYS. CRITICAL RESULT CALLED TO, READ BACK BY AND VERIFIED WITH: JESSIE WESSELINK @ 1610 01/07/13 BY KRAWS     0241 Note: Gram Stain Report  Called to,Read Back By and Verified With: DORA GARDNER 01/05/2013 [facility]     Performed at [facility]   Report Status PENDING   Incomplete  CULTURE, BLOOD (ROUTINE X 2)     Status: None   Collection Time    01/04/13 10:20 AM      Result Value Range Status   Specimen Description BLOOD HAND LEFT   Final   Special Requests BOTTLES DRAWN AEROBIC ONLY [facility]   Final   Culture  Setup Time     Final   Value: 01/04/2013 14:46     Performed at [facility]   Culture     Final   Value: SALMONELLA SPECIES     Note: SUSCEPTIBILITIES PERFORMED ON PREVIOUS CULTURE WITHIN THE LAST 5 DAYS. CRITICAL RESULT CALLED TO, READ BACK BY AND VERIFIED WITH: JESSIE Lolita Patella @ 9604 01/07/13 BY KRAWS     Note: Gram Stain Report Called to,Read Back By and Verified With: Marlaine Hind [id number] BRMEL     Performed at [facility]   Report Status PENDING   Incomplete  CULTURE, BLOOD (ROUTINE X 2)     Status: None   Collection Time    01/04/13  8:40 PM      Result Value Range Status   Specimen Description BLOOD LEFT HAND   Final   Special Requests BOTTLES DRAWN AEROBIC ONLY [facility]   Final   Culture  Setup Time     Final   Value: 01/05/2013 02:42     Performed at [facility]   Culture     Final   Value: SALMONELLA SPECIES     Note: CRITICAL RESULT CALLED TO, READ BACK BY AND VERIFIED WITH: Trudee Grip @ 1478 01/07/13 BY KRAWS     Note: Gram Stain Report Called to,Read Back By and Verified With: AVERI B @ 1452 01/05/13 BY KRAWS     Performed at [facility]   Report Status PENDING   Incomplete   Organism ID, Bacteria SALMONELLA SPECIES   Final  CULTURE, BLOOD (ROUTINE X 2)     Status: None   Collection Time    01/04/13  8:45 PM      Result Value Range Status   Specimen Description BLOOD LEFT ARM   Final   Special Requests BOTTLES DRAWN AEROBIC ONLY [facility]   Final   Culture  Setup Time     Final   Value: 01/05/2013 02:41     Performed at [facility]   Culture      Final   Value: SALMONELLA SPECIES     Note: SUSCEPTIBILITIES PERFORMED ON PREVIOUS CULTURE WITHIN THE LAST 5 DAYS. CRITICAL RESULT CALLED TO, READ BACK BY AND VERIFIED WITH: Trudee Grip @ 0960 01/07/13 BY KRAWS     Note: Gram Stain Report Called to,Read Back By and Verified With: AVERI B @ 1452 01/05/13 BY KRAWS     Performed at [facility]   Report Status PENDING   Incomplete  URINE CULTURE     Status: None   Collection Time    01/05/13  7:16 AM      Result Value Range Status   Specimen Description URINE, RANDOM   Final   Special Requests Normal   Final   Culture  Setup Time     Final   Value: 01/05/2013 09:17     Performed at [facility]     Final   Value: NO GROWTH     Performed at [facility]   Culture     Final   Value: NO GROWTH     Performed at [facility]   Report Status 01/06/2013 FINAL   Final  CLOSTRIDIUM DIFFICILE BY PCR     Status: None   Collection Time    01/06/13  2:12 PM      Result Value Range Status   C difficile by pcr NEGATIVE  NEGATIVE Final     Studies: No results found.  Scheduled Meds: . folic acid  1 mg Oral Daily  . LORazepam  3 mg Oral Q3H  . multivitamin with minerals  1 tablet Oral Daily  . phosphorus  250 mg Oral TID  . potassium chloride  40 mEq Oral Daily  . sulfamethoxazole-trimethoprim  1 tablet Oral Q12H  . thiamine  100 mg Oral Daily   Continuous Infusions:   Christiane Ha, MD  [facility] Pager [phone]. If 7PM-7AM, please contact night-coverage at [email or web address], password [facility] 01/09/2013, 8:29 AM  LOS: 5 days

## 2013-01-10 DIAGNOSIS — K759 Inflammatory liver disease, unspecified: Secondary | ICD-10-CM

## 2013-01-10 DIAGNOSIS — F10239 Alcohol dependence with withdrawal, unspecified: Secondary | ICD-10-CM

## 2013-01-10 LAB — CULTURE, BLOOD (ROUTINE X 2)

## 2013-01-10 LAB — CBC
MCHC: 35.4 g/dL (ref 30.0–36.0)
Platelets: 241 10*3/uL (ref 150–400)
RDW: 13.9 % (ref 11.5–15.5)
WBC: 4.8 10*3/uL (ref 4.0–10.5)

## 2013-01-10 LAB — HEAVY METALS, RANDOM URINE: Creatinine Random, Urine: 150.4 mg/dL (ref 20.0–370.0)

## 2013-01-10 MED ORDER — RISPERIDONE 0.5 MG PO TABS
0.5000 mg | ORAL_TABLET | Freq: Two times a day (BID) | ORAL | Status: DC | PRN
  Filled 2013-01-10: qty 1

## 2013-01-10 NOTE — Progress Notes (Signed)
PULMONARY  / CRITICAL CARE MEDICINE  Name: Theodore Brown MRN: [id number] DOB: [birth year]    ADMISSION DATE:  01/04/2013 CONSULTATION DATE:  01/10/2013  REFERRING MD :  Dr. Lendell Caprice PRIMARY SERVICE: Hospitalist  CHIEF COMPLAINT:  Delirium  BRIEF PATIENT DESCRIPTION: Theodore Brown is a 47 year old male with alcohol withdrawal hallucinations and pancreatitis.  SIGNIFICANT EVENTS / STUDIES:  9/02 - Admitted with withdrawal from ETOH & pancreatitis.  CT Head negative.   LINES / TUBES: Peripheral IVs  CULTURES: Cdiff PCR 9/4>>>neg  ANTIBIOTICS: Bactrim 9/7>>> Cipro 9/5>>9/7 Zosyn 9/2 x1 Vanco 9/2 x1   INTERVAL Hx:  RN reports pt is comfortable with decreased agitation on current dosing of ativan - 4mg  Q3.   Sitting at side of bed with sitter  PHYSICAL EXAM General: frail, thin adult male in NAD Neuro: Awake, alert, oriented to self, place & time, MAE CV: s1s2 rrr, no m/r/g PULM: resp's even/non-labored, lungs bilaterally clear.  No evidence of respiratory compromise with ativan dosing GI: round /soft, bsx4 active, tol PO's Extremities: warm/dry, wasting    VITAL SIGNS: Temp:  [97.7 F (36.5 C)-97.9 F (36.6 C)] 97.9 F (36.6 C) (09/08 0628) Pulse Rate:  [71-86] 86 (09/08 0628) Resp:  [17-18] 17 (09/08 0628) BP: (101-134)/(70-82) 112/76 mmHg (09/08 0628) SpO2:  [99 %-100 %] 100 % (09/08 0628)  INTAKE / OUTPUT: Intake/Output     09/07 0701 - 09/08 0700 09/08 0701 - 09/09 0700   P.O.  60   Total Intake(mL/kg)  60 (1.2)   Net   +60          LABS:  CBC Recent Labs     01/09/13  0521  01/10/13  0425  WBC  3.8*  4.8  HGB  10.9*  11.1*  HCT  30.6*  31.4*  PLT  163  241   BMET Recent Labs     01/09/13  0521  NA  133*  K  3.8  CL  99  CO2  23  BUN  11  CREATININE  0.77  GLUCOSE  94    Electrolytes Recent Labs     01/09/13  0521  CALCIUM  9.5   Liver Enzymes Recent Labs     01/09/13  0521  AST  248*  ALT  111*  ALKPHOS  173*  BILITOT   1.4*  ALBUMIN  3.0*   Imaging No results found.  ASSESSMENT / PLAN: Principal Problem:   Salmonella bacteremia Active Problems:   Hyponatremia   Hypokalemia   Alcohol dependence   Fever   Acute pancreatitis   SIRS (systemic inflammatory response syndrome)   Thrombocytopenia, unspecified   Alcohol withdrawal delirium   Alcoholic liver disease   NEUROLOGIC A:  Alcohol withdrawal hallucinations: At this time, there is no evidence of frank delirium tremens. Theodore Brown apparently had a severe episode of behavioral agitation on 9/7-9/8 evening/ early am, however, at this time he is calm and redirectable.   P:   Continue CIWA protocol Monitor for change in sx Continue to monitor on floor Safety sitter at bedside Recommend outpatient therapy if pt willing to participate Consider social work consult  GASTROINTESTINAL A: Alcoholic hepatitis:   P: Defer management to the primary service. LFT's improving   INFECTIOUS A: Samonella Bacteremia P:  Abx as above  HEMATOLOGIC A:   Thrombocytopenia - in setting of ETOH abuse / bacteremia P:  Monitor on lovenox    PCCM will be available PRN.  Please call if change in patients current  status.    Theodore Brim, NP-C [facility] Pgr: [phone] or [phone]  Independently examined pt, evaluated data & formulated above care plan with NP  Theodore Brown V.  01/10/2013, 10:05 AM

## 2013-01-10 NOTE — Progress Notes (Signed)
Patient confused, unsteady gait and attempting to leave the unit.  Patient alert to self only. Unable to redirect.  Security called and MD notified.
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
## 2013-01-10 NOTE — Progress Notes (Signed)
This PA was paged by the nurse regarding Theodore Brown. He started to experience visual hallucinations and was oriented x 1 (person) trying to leave. Security was called and was able to convince patient to go back into his room. The patient told the nurse that he saw a girl in the hallway and a spider. IVC paperwork was filled out and is in the patient's chart. Social work was unable to be contacted (phone off).   S: At this time the patient got up to leave but the nurse convinced the patient he got up to use the bathroom. He describes seeing something that sounds like a rabbit.  O: HR 60. Patient is now slow moving, talking much slower than earlier in the night. Oriented x1. A/P: Delirium Tremens - PCCM consulted, and will hold off on transferring patient to the ICU at this time, as he has calmed down and is now compliant and stable. Will continue with current scheduled ativan and continue to monitor. Patient's mother was called, but she did not answer and she did not identify herself on the answering machine.  Melodye Ped, PA-C 3:34 AM 01/10/2013
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
## 2013-01-10 NOTE — Progress Notes (Signed)
[facility] PROGRESS NOTE  WENCESLAUS GIST JXB:[id number] DOB: 1965-11-20 DOA: 01/04/2013 PCP: No primary provider on file.  47 yo male with PMH of alcohol abuse, substance abuse and chronic pancreatitis.  Presented after drinking beer for 3 days straight and falling down concrete stairs.  Assessment/Plan:  Salmonella bacteremia:  See below  DTs: still confused. Redirectable. Increase ativan to 4mg  q3.  Thrombocytopenia corrected! Start lovenox  Alcohol dependence:  Long discussion with NOK, mother 9/7.  Pt is homeless.  She is requesting inpatient alcohol treatement if patient agrees.     alcoholic liver disease: LFTs improving  DVT Prophylaxis:    Code Status: full Family Communication: Disposition Plan: inpatient.  Consultants:  CCM  Procedures:    Antibiotics:  Vanc 9/2 - d/c'd 9/3  Zosyn 9/2 -> 9/5  cipro 9/5 - 9/7  bactrim 9/7  HPI/Subjective:   Per RN, confused all night  Objective: Filed Vitals:   01/09/13 1311 01/09/13 1600 01/09/13 2108 01/10/13 0628  BP: 101/70 105/72 134/75 112/76  Pulse: 77 80 71 86  Temp: 97.8 F (36.6 C)  97.7 F (36.5 C) 97.9 F (36.6 C)  TempSrc: Oral  Oral Oral  Resp: 18  17 17   Height:      Weight:      SpO2: 100%  99% 100%   No intake or output data in the 24 hours ending 01/10/13 0829 Filed Weights   01/04/13 0857 01/06/13 0657  Weight: 58.968 kg (130 lb) 49.578 kg (109 lb 4.8 oz)    Exam:   General:  Walking around disoriented. Slightly tremulous  Cardiovascular: RRR, no murmurs, rubs or gallops, no lower extremity edema  Respiratory: CTA, no wheeze, crackles, or rales.  No increased work of breathing.  Abdomen: Soft, non-tender, non-distended, + bowel sounds, no masses  Ext: no CCE  Data Reviewed: Basic Metabolic Panel:  Recent Labs Lab 01/04/13 1010 01/04/13 2048 01/04/13 2255 01/05/13 0550 01/06/13 0442 01/07/13 0442 01/09/13 0521  NA 123*  --   --  130* 128* 132* 133*  K 2.8*   --  3.2* 2.8* 3.2* 3.9 3.8  CL 85*  --   --  96 95* 99 99  CO2 23  --   --  20 20 21 23   GLUCOSE 127*  --   --  95 96 92 94  BUN 6  --   --  7 8 10 11   CREATININE 0.77  --   --  0.79 0.87 0.93 0.77  CALCIUM 9.1  --   --  9.0 8.9 9.4 9.5  MG  --  1.9 2.0  --   --   --   --   PHOS  --  1.8*  --   --   --   --   --    Liver Function Tests:  Recent Labs Lab 01/04/13 1010 01/05/13 0550 01/06/13 0442 01/09/13 0521  AST 752* 581* 410* 248*  ALT 141* 125* 106* 111*  ALKPHOS 142* 119* 116 173*  BILITOT 2.0* 2.1* 2.1* 1.4*  PROT 7.5 6.9 6.8 7.0  ALBUMIN 3.5 3.1* 3.1* 3.0*    Recent Labs Lab 01/04/13 1010  LIPASE 136*   CBC:  Recent Labs Lab 01/04/13 1010 01/04/13 2048 01/05/13 0550 01/06/13 0442 01/09/13 0521 01/10/13 0425  WBC 6.6 6.3 5.8 6.2 3.8* 4.8  NEUTROABS 5.4  --  4.8 3.8 1.3*  --   HGB 12.0* 11.6* 11.7* 11.4* 10.9* 11.1*  HCT 33.7* 32.7* 33.7* 31.4* 30.6* 31.4*  MCV 88.0 89.1 88.9 88.0 87.9 88.5  PLT 51* 50* 47* 51* 163 241   Cardiac Enzymes:  Recent Labs Lab 01/04/13 2050  TROPONINI <0.30    Recent Results (from the past 240 hour(s))  CULTURE, BLOOD (ROUTINE X 2)     Status: None   Collection Time    01/04/13 10:10 AM      Result Value Range Status   Specimen Description BLOOD LEFT ANTECUBITAL   Final   Special Requests BOTTLES DRAWN AEROBIC ONLY [facility]   Final   Culture  Setup Time     Final   Value: 01/04/2013 14:46     Performed at [facility]   Culture     Final   Value: SALMONELLA SPECIES     Note: SUSCEPTIBILITIES PERFORMED ON PREVIOUS CULTURE WITHIN THE LAST 5 DAYS. CRITICAL RESULT CALLED TO, READ BACK BY AND VERIFIED WITH: JESSIE WESSELINK @ 1610 01/07/13 BY KRAWS     0241 Note: Gram Stain Report Called to,Read Back By and Verified With: DORA GARDNER 01/05/2013 [facility]     Performed at [facility]   Report Status PENDING   Incomplete  CULTURE, BLOOD (ROUTINE X 2)     Status: None   Collection Time    01/04/13 10:20 AM       Result Value Range Status   Specimen Description BLOOD HAND LEFT   Final   Special Requests BOTTLES DRAWN AEROBIC ONLY 3CC   Final   Culture  Setup Time     Final   Value: 01/04/2013 14:46     Performed at [facility]   Culture     Final   Value: SALMONELLA SPECIES     Note: SUSCEPTIBILITIES PERFORMED ON PREVIOUS CULTURE WITHIN THE LAST 5 DAYS. CRITICAL RESULT CALLED TO, READ BACK BY AND VERIFIED WITH: JESSIE Lolita Patella @ 9604 01/07/13 BY KRAWS     Note: Gram Stain Report Called to,Read Back By and Verified With: Marlaine Hind [id number] BRMEL     Performed at [facility]   Report Status PENDING   Incomplete  CULTURE, BLOOD (ROUTINE X 2)     Status: None   Collection Time    01/04/13  8:40 PM      Result Value Range Status   Specimen Description BLOOD LEFT HAND   Final   Special Requests BOTTLES DRAWN AEROBIC ONLY 5CC   Final   Culture  Setup Time     Final   Value: 01/05/2013 02:42     Performed at [facility]   Culture     Final   Value: SALMONELLA SPECIES     Note: CRITICAL RESULT CALLED TO, READ BACK BY AND VERIFIED WITH: JESSIE Lolita Patella @ 1478 01/07/13 BY KRAWS     Note: Gram Stain Report Called to,Read Back By and Verified With: AVERI B @ 1452 01/05/13 BY KRAWS     Performed at [facility]   Report Status PENDING   Incomplete   Organism ID, Bacteria SALMONELLA SPECIES   Final  CULTURE, BLOOD (ROUTINE X 2)     Status: None   Collection Time    01/04/13  8:45 PM      Result Value Range Status   Specimen Description BLOOD LEFT ARM   Final   Special Requests BOTTLES DRAWN AEROBIC ONLY 5CC   Final   Culture  Setup Time     Final   Value: 01/05/2013 02:41     Performed at [facility]   Culture     Final   Value: SALMONELLA SPECIES     Note: SUSCEPTIBILITIES PERFORMED ON PREVIOUS CULTURE WITHIN THE LAST 5 DAYS. CRITICAL RESULT CALLED TO, READ BACK BY AND VERIFIED WITH: Trudee Grip @ 4540 01/07/13 BY KRAWS     Note: Gram Stain  Report Called to,Read Back By and Verified With: AVERI B @ 1452 01/05/13 BY KRAWS     Performed at [facility]   Report Status PENDING   Incomplete  URINE CULTURE     Status: None   Collection Time    01/05/13  7:16 AM      Result Value Range Status   Specimen Description URINE, RANDOM   Final   Special Requests Normal   Final   Culture  Setup Time     Final   Value: 01/05/2013 09:17     Performed at [facility] Count     Final   Value: NO GROWTH     Performed at [facility]   Culture     Final   Value: NO GROWTH     Performed at [facility]   Report Status 01/06/2013 FINAL   Final  CLOSTRIDIUM DIFFICILE BY PCR     Status: None   Collection Time    01/06/13  2:12 PM      Result Value Range Status   C difficile by pcr NEGATIVE  NEGATIVE Final     Studies: No results found.  Scheduled Meds: . enoxaparin (LOVENOX) injection  40 mg Subcutaneous Q24H  . folic acid  1 mg Oral Daily  . LORazepam  4 mg Oral Q3H  . multivitamin with minerals  1 tablet Oral Daily  . phosphorus  250 mg Oral TID  . potassium chloride  40 mEq Oral Daily  . sulfamethoxazole-trimethoprim  1 tablet Oral Q12H  . thiamine  100 mg Oral Daily   Continuous Infusions:   Christiane Ha, MD  [facility] Pager [phone]. If 7PM-7AM, please contact night-coverage at [email or web address], password [facility] 01/10/2013, 8:29 AM  LOS: 6 days

## 2013-01-11 MED ORDER — ENSURE COMPLETE PO LIQD
237.0000 mL | Freq: Two times a day (BID) | ORAL | Status: DC
  Administered 2013-01-12 – 2013-01-13 (×2): 237 mL via ORAL

## 2013-01-11 MED ORDER — LORAZEPAM 1 MG PO TABS
4.0000 mg | ORAL_TABLET | ORAL | Status: DC
  Administered 2013-01-11 – 2013-01-12 (×3): 4 mg via ORAL
  Filled 2013-01-11 (×4): qty 4

## 2013-01-11 NOTE — Progress Notes (Signed)
[facility] PROGRESS NOTE  Theodore Brown ZOX:[id number] DOB: 08-31-1965 DOA: 01/04/2013 PCP: No primary provider on file.  47 yo homeless male with PMH of alcohol dependence presented to the emergency room after falling downstairs. Noted to be febrile. Blood cultures grew out Salmonella. Initially on Cipro but there was concern this was contributing to confusion, so switched to Bactrim. Resolution of fevers and diarrhea, but remains in protracted alcohol withdrawal. Prior to becoming delirious, patient reported he did not want alcohol treatment.  Assessment/Plan:  Salmonella bacteremia:  No fevers or diarrhea reported. Continue oral Bactrim for now. Would treat for a total course of 2 weeks.  DTs: still confused. Redirectable. Previously, when tried to taper Ativan, became confused and security had to be called. Will change Ativan to 4 mg every 4 hours and taper as able. Corporate investment banker.  Thrombocytopenia resolved. Initially felt to be due to alcoholic cirrhosis, but may have just been related to infection.  Alcohol dependence:  Long discussion with NOK, mother 9/7.  Pt is homeless.  She is requesting inpatient alcohol treatement if patient agrees.     alcoholic liver disease: LFTs improving. Nodular liver contour on imaging  DVT Prophylaxis:    Code Status: full Family Communication: Disposition Plan: inpatient.  Consultants:  CCM consulted by night average  Psychiatry consulted by night coverage  Procedures:    Antibiotics:  Vanc 9/2 - d/c'd 9/3  Zosyn 9/2 -> 9/5  cipro 9/5 - 9/7  bactrim 9/7 >  HPI/Subjective:   Per RN, still confused  Objective: Filed Vitals:   01/11/13 0003 01/11/13 0505 01/11/13 0743 01/11/13 1231  BP: 102/63 93/59 110/69 114/71  Pulse:  76 92 82  Temp:  98.2 F (36.8 C)    TempSrc:  Oral    Resp:  18    Height:      Weight:      SpO2:  100%      Intake/Output Summary (Last 24 hours) at 01/11/13 1349 Last data filed at  01/11/13 0607  Gross per 24 hour  Intake    716 ml  Output      0 ml  Net    716 ml   Filed Weights   01/04/13 0857 01/06/13 0657  Weight: 58.968 kg (130 lb) 49.578 kg (109 lb 4.8 oz)    Exam:   General:  Cooperative. Slightly groggy. Disoriented to time. Oriented to person place and to a certain extent situation.  Cardiovascular: RRR, no murmurs, rubs or gallops, no lower extremity edema  Respiratory: CTA, no wheeze, crackles, or rales.  No increased work of breathing.  Abdomen: Soft, non-tender, non-distended, + bowel sounds, no masses  Ext: no CCE. No tremor.  Data Reviewed: Basic Metabolic Panel:  Recent Labs Lab 01/04/13 2048 01/04/13 2255 01/05/13 0550 01/06/13 0442 01/07/13 0442 01/09/13 0521  NA  --   --  130* 128* 132* 133*  K  --  3.2* 2.8* 3.2* 3.9 3.8  CL  --   --  96 95* 99 99  CO2  --   --  20 20 21 23   GLUCOSE  --   --  95 96 92 94  BUN  --   --  7 8 10 11   CREATININE  --   --  0.79 0.87 0.93 0.77  CALCIUM  --   --  9.0 8.9 9.4 9.5  MG 1.9 2.0  --   --   --   --   PHOS 1.8*  --   --   --   --   --  Liver Function Tests:  Recent Labs Lab 01/05/13 0550 01/06/13 0442 01/09/13 0521  AST 581* 410* 248*  ALT 125* 106* 111*  ALKPHOS 119* 116 173*  BILITOT 2.1* 2.1* 1.4*  PROT 6.9 6.8 7.0  ALBUMIN 3.1* 3.1* 3.0*   No results found for this basename: LIPASE, AMYLASE,  in the last 168 hours CBC:  Recent Labs Lab 01/04/13 2048 01/05/13 0550 01/06/13 0442 01/09/13 0521 01/10/13 0425  WBC 6.3 5.8 6.2 3.8* 4.8  NEUTROABS  --  4.8 3.8 1.3*  --   HGB 11.6* 11.7* 11.4* 10.9* 11.1*  HCT 32.7* 33.7* 31.4* 30.6* 31.4*  MCV 89.1 88.9 88.0 87.9 88.5  PLT 50* 47* 51* 163 241   Cardiac Enzymes:  Recent Labs Lab 01/04/13 2050  TROPONINI <0.30    Recent Results (from the past 240 hour(s))  CULTURE, BLOOD (ROUTINE X 2)     Status: None   Collection Time    01/04/13 10:10 AM      Result Value Range Status   Specimen Description BLOOD LEFT  ANTECUBITAL   Final   Special Requests BOTTLES DRAWN AEROBIC ONLY [facility]   Final   Culture  Setup Time     Final   Value: 01/04/2013 14:46     Performed at [facility]   Culture     Final   Value: SALMONELLA SPECIES     Note: SUSCEPTIBILITIES PERFORMED ON PREVIOUS CULTURE WITHIN THE LAST 5 DAYS. CRITICAL RESULT CALLED TO, READ BACK BY AND VERIFIED WITH: JESSIE WESSELINK @ 4098 01/07/13 BY KRAWS Previous Specimen Sent to [facility] for Typing.     0241 Note: Gram Stain Report Called to,Read Back By and Verified With: DORA GARDNER 01/05/2013 [facility]     Performed at [facility]   Report Status 01/10/2013 FINAL   Final  CULTURE, BLOOD (ROUTINE X 2)     Status: None   Collection Time    01/04/13 10:20 AM      Result Value Range Status   Specimen Description BLOOD HAND LEFT   Final   Special Requests BOTTLES DRAWN AEROBIC ONLY [facility]   Final   Culture  Setup Time     Final   Value: 01/04/2013 14:46     Performed at [facility]   Culture     Final   Value: SALMONELLA SPECIES     Note: SUSCEPTIBILITIES PERFORMED ON PREVIOUS CULTURE WITHIN THE LAST 5 DAYS. CRITICAL RESULT CALLED TO, READ BACK BY AND VERIFIED WITH: Trudee Grip @ 1191 01/07/13 BY KRAWS Previous Specimen Sent to [facility] Lab for Typing.     Note: Gram Stain Report Called to,Read Back By and Verified With: Marlaine Hind [id number] BRMEL     Performed at [facility]   Report Status 01/10/2013 FINAL   Final  CULTURE, BLOOD (ROUTINE X 2)     Status: None   Collection Time    01/04/13  8:40 PM      Result Value Range Status   Specimen Description BLOOD LEFT HAND   Final   Special Requests BOTTLES DRAWN AEROBIC ONLY [facility]   Final   Culture  Setup Time     Final   Value: 01/05/2013 02:42  CRITICAL RESULT CALLED TO, READ BACK BY AND VERIFIED WITH: JESSIE WESSELINK @ 1308 01/07/13 BY KRAWS FAXED TO [facility] CONNIE WEANT @1100  01/07/13 BY MODAN Referred to [facility] in [place]      [place].  Performed at [facility]     Final   Value: SALMONELLA SPECIES     Note: CRITICAL RESULT CALLED TO, READ BACK BY AND VERIFIED WITH: JESSIE Lolita Patella @ 8469 01/07/13 BY KRAWS FAXED TO [facility] CON[facility]IE WEANT @1100  01/07/13 BY MODAN Referred to [facility] in [place], [place].     Note: Gram Stain Report Called to,Read Back By and Verified With: AVERI B @ 1452 01/05/13 BY KRAWS     Performed at [facility]   Report Status PENDING   Incomplete   Organism ID, Bacteria SALMONELLA SPECIES   Final  CULTURE, BLOOD (ROUTINE X 2)     Status: None   Collection Time    01/04/13  8:45 PM      Result Value Range Status   Specimen Description BLOOD LEFT ARM   Final   Special Requests BOTTLES DRAWN AEROBIC ONLY [facility]   Final   Culture  Setup Time     Final   Value: 01/05/2013 02:41     Performed at [facility]   Culture     Final   Value: SALMONELLA SPECIES     Note: SUSCEPTIBILITIES PERFORMED ON PREVIOUS CULTURE WITHIN THE LAST 5 DAYS. CRITICAL RESULT CALLED TO, READ BACK BY AND VERIFIED WITH: Trudee Grip @ 6295 01/07/13 BY KRAWS Previous Specimen Sent to [facility] for Typing.     Note: Gram Stain Report Called to,Read Back By and Verified With: AVERI B @ 1452 01/05/13 BY KRAWS     Performed at [facility]   Report Status 01/10/2013 FINAL   Final  URINE CULTURE     Status: None   Collection Time    01/05/13  7:16 AM      Result Value Range Status   Specimen Description URINE, RANDOM   Final   Special Requests Normal   Final   Culture  Setup Time     Final   Value: 01/05/2013 09:17     Performed at [facility]     Final   Value: NO GROWTH     Performed at [facility]   Culture     Final   Value: NO GROWTH     Performed at [facility]   Report Status 01/06/2013 FINAL   Final  CLOSTRIDIUM DIFFICILE BY PCR     Status: None   Collection  Time    01/06/13  2:12 PM      Result Value Range Status   C difficile by pcr NEGATIVE  NEGATIVE Final     Studies: No results found.  Scheduled Meds: . enoxaparin (LOVENOX) injection  40 mg Subcutaneous Q24H  . feeding supplement  237 mL Oral BID BM  . folic acid  1 mg Oral Daily  . LORazepam  4 mg Oral Q3H  . multivitamin with minerals  1 tablet Oral Daily  . phosphorus  250 mg Oral TID  . potassium chloride  40 mEq Oral Daily  . sulfamethoxazole-trimethoprim  1 tablet Oral Q12H  . thiamine  100 mg Oral Daily   Continuous Infusions:   Christiane Ha, MD  [facility] Pager [phone]. If 7PM-7AM, please contact night-coverage at [email or web address], password [facility] 01/11/2013, 1:49 PM  LOS: 7 days
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
## 2013-01-12 LAB — COMPREHENSIVE METABOLIC PANEL
AST: 130 U/L — ABNORMAL HIGH (ref 0–37)
Albumin: 3.3 g/dL — ABNORMAL LOW (ref 3.5–5.2)
Alkaline Phosphatase: 187 U/L — ABNORMAL HIGH (ref 39–117)
BUN: 10 mg/dL (ref 6–23)
Chloride: 99 mEq/L (ref 96–112)
Potassium: 4.1 mEq/L (ref 3.5–5.1)
Sodium: 133 mEq/L — ABNORMAL LOW (ref 135–145)
Total Bilirubin: 1 mg/dL (ref 0.3–1.2)
Total Protein: 7.4 g/dL (ref 6.0–8.3)

## 2013-01-12 MED ORDER — LORAZEPAM 1 MG PO TABS: 2.0000 mg | ORAL_TABLET | Freq: Three times a day (TID) | ORAL | Status: DC

## 2013-01-12 MED ORDER — LORAZEPAM 1 MG PO TABS: 2.0000 mg | ORAL_TABLET | ORAL | Status: DC | PRN

## 2013-01-12 MED ORDER — LORAZEPAM 1 MG PO TABS
2.0000 mg | ORAL_TABLET | Freq: Three times a day (TID) | ORAL | Status: DC
  Administered 2013-01-12 (×2): 2 mg via ORAL
  Filled 2013-01-12 (×2): qty 2

## 2013-01-12 NOTE — Progress Notes (Signed)
[facility] PROGRESS NOTE  Theodore Brown BMW:[id number] DOB: 16-May-1965 DOA: 01/04/2013 PCP: No primary provider on file.  47 yo homeless male with PMH of alcohol dependence presented to the emergency room after falling downstairs. Noted to be febrile. Blood cultures grew out Salmonella. Initially on Cipro but there was concern this was contributing to confusion, so switched to Bactrim. Resolution of fevers and diarrhea, but experienced protracted alcohol withdrawal.   Assessment/Plan:  Salmonella bacteremia:   No fevers or diarrhea reported.  Continue oral Bactrim (started 9/7). Would treat for a total course of 2 weeks.  DTs:  Appear resolved.  Patient on high doses of ativan (4 mg q 4 hours) Previously, when tried to taper Ativan, became confused and security had to be called.  Will change Ativan to 2 mg TID and taper as able. Corporate investment banker.  Thrombocytopenia  Resolved. Initially felt to be due to alcoholic cirrhosis, but may have just been related to infection.  Alcohol dependence:   Long discussion with mother 9/7.  Pt is homeless.   She is requesting inpatient alcohol treatement if patient agrees.   Appreciate Psych Social work meeting with patient and mother - providing resources and education about process of getting into inpatient alcohol rehab.  alcoholic liver disease: LFTs improving down trending. Nodular liver contour on imaging  DVT Prophylaxis:    Code Status: full Family Communication: Talked with mother at bedside. Disposition Plan: inpatient.  Likely discharge to shelter tomorrow.  Mother unable to take him home.  Consultants:  CCM consulted by night average  Psychiatry consulted by night coverage  Procedures:    Antibiotics:  Vanc 9/2 - d/c'd 9/3  Zosyn 9/2 -> 9/5  cipro 9/5 - 9/7  bactrim 9/7 >  HPI/Subjective: Patient wants to go home today.  Is planning to attend inpatient ETOH rehab.  Objective: Filed Vitals:   01/11/13  2240 01/12/13 0527 01/12/13 0722 01/12/13 1038  BP: 127/82 132/79 114/80 109/80  Pulse: 71 87 88 92  Temp: 98.6 F (37 C) 97.5 F (36.4 C)  97.6 F (36.4 C)  TempSrc: Oral Oral  Oral  Resp: 18 18  16   Height:      Weight:      SpO2: 100% 93%  100%    Intake/Output Summary (Last 24 hours) at 01/12/13 1254 Last data filed at 01/12/13 1036  Gross per 24 hour  Intake    600 ml  Output      0 ml  Net    600 ml   Filed Weights   01/04/13 0857 01/06/13 0657  Weight: 58.968 kg (130 lb) 49.578 kg (109 lb 4.8 oz)    Exam:   General:  Cooperative. Thin, AA male, sitting on the side of the bed, requesting discharge.  Cardiovascular: RRR, no murmurs, rubs or gallops, no lower extremity edema  Respiratory: CTA, no wheeze, crackles, or rales.  No increased work of breathing.  Abdomen: Soft, non-tender, non-distended, + bowel sounds, no masses  Ext: no CCE. No tremor.  Data Reviewed: Basic Metabolic Panel:  Recent Labs Lab 01/06/13 0442 01/07/13 0442 01/09/13 0521 01/12/13 0510  NA 128* 132* 133* 133*  K 3.2* 3.9 3.8 4.1  CL 95* 99 99 99  CO2 20 21 23 25   GLUCOSE 96 92 94 76  BUN 8 10 11 10   CREATININE 0.87 0.93 0.77 1.08  CALCIUM 8.9 9.4 9.5 9.9   Liver Function Tests:  Recent Labs Lab 01/06/13 0442 01/09/13 0521 01/12/13 0510  AST  410* 248* 130*  ALT 106* 111* 92*  ALKPHOS 116 173* 187*  BILITOT 2.1* 1.4* 1.0  PROT 6.8 7.0 7.4  ALBUMIN 3.1* 3.0* 3.3*   CBC:  Recent Labs Lab 01/06/13 0442 01/09/13 0521 01/10/13 0425  WBC 6.2 3.8* 4.8  NEUTROABS 3.8 1.3*  --   HGB 11.4* 10.9* 11.1*  HCT 31.4* 30.6* 31.4*  MCV 88.0 87.9 88.5  PLT 51* 163 241     Recent Results (from the past 240 hour(s))  CULTURE, BLOOD (ROUTINE X 2)     Status: None   Collection Time    01/04/13 10:10 AM      Result Value Range Status   Specimen Description BLOOD LEFT ANTECUBITAL   Final   Special Requests BOTTLES DRAWN AEROBIC ONLY [facility]   Final   Culture  Setup Time      Final   Value: 01/04/2013 14:46     Performed at [facility]   Culture     Final   Value: SALMONELLA SPECIES     Note: SUSCEPTIBILITIES PERFORMED ON PREVIOUS CULTURE WITHIN THE LAST 5 DAYS. CRITICAL RESULT CALLED TO, READ BACK BY AND VERIFIED WITH: JESSIE WESSELINK @ 1610 01/07/13 BY KRAWS Previous Specimen Sent to [facility] for Typing.     0241 Note: Gram Stain Report Called to,Read Back By and Verified With: DORA GARDNER 01/05/2013 [facility]     Performed at [facility]   Report Status 01/10/2013 FINAL   Final  CULTURE, BLOOD (ROUTINE X 2)     Status: None   Collection Time    01/04/13 10:20 AM      Result Value Range Status   Specimen Description BLOOD HAND LEFT   Final   Special Requests BOTTLES DRAWN AEROBIC ONLY [facility]   Final   Culture  Setup Time     Final   Value: 01/04/2013 14:46     Performed at [facility]   Culture     Final   Value: SALMONELLA SPECIES     Note: SUSCEPTIBILITIES PERFORMED ON PREVIOUS CULTURE WITHIN THE LAST 5 DAYS. CRITICAL RESULT CALLED TO, READ BACK BY AND VERIFIED WITH: Trudee Grip @ 9604 01/07/13 BY KRAWS Previous Specimen Sent to [facility] for Typing.     Note: Gram Stain Report Called to,Read Back By and Verified With: Marlaine Hind [id number] BRMEL     Performed at [facility]   Report Status 01/10/2013 FINAL   Final  CULTURE, BLOOD (ROUTINE X 2)     Status: None   Collection Time    01/04/13  8:40 PM      Result Value Range Status   Specimen Description BLOOD LEFT HAND   Final   Special Requests BOTTLES DRAWN AEROBIC ONLY [facility]   Final   Culture  Setup Time     Final   Value: 01/05/2013 02:42  CRITICAL RESULT CALLED TO, READ BACK BY AND VERIFIED WITH: JESSIE WESSELINK @ 1478 01/07/13 BY KRAWS FAXED TO [facility]IE WEANT @1100  01/07/13 BY MODAN Referred to [facility] in [place], [place]      Serotyping.     Performed at [facility]     Final   Value:  SALMONELLA SPECIES     Note: CRITICAL RESULT CALLED TO, READ BACK BY AND VERIFIED WITH: JESSIE WESSELINK @ 2956 01/07/13 BY KRAWS FAXED TO [facility] CON[facility]IE WEANT @1100  01/07/13 BY MODAN Referred to [facility] in [place], [place].     Note: Gram Stain Report Called to,Read Back By and Verified With: AVERI B @ 1452 01/05/13 BY KRAWS     Performed at [facility]   Report Status PENDING   Incomplete   Organism ID, Bacteria SALMONELLA SPECIES   Final  CULTURE, BLOOD (ROUTINE X 2)     Status: None   Collection Time    01/04/13  8:45 PM      Result Value Range Status   Specimen Description BLOOD LEFT ARM   Final   Special Requests BOTTLES DRAWN AEROBIC ONLY [facility]   Final   Culture  Setup Time     Final   Value: 01/05/2013 02:41     Performed at [facility]   Culture     Final   Value: SALMONELLA SPECIES     Note: SUSCEPTIBILITIES PERFORMED ON PREVIOUS CULTURE WITHIN THE LAST 5 DAYS. CRITICAL RESULT CALLED TO, READ BACK BY AND VERIFIED WITH: Trudee Grip @ 1610 01/07/13 BY KRAWS Previous Specimen Sent to [facility] Lab for Typing.     Note: Gram Stain Report Called to,Read Back By and Verified With: AVERI B @ 1452 01/05/13 BY KRAWS     Performed at [facility]   Report Status 01/10/2013 FINAL   Final  URINE CULTURE     Status: None   Collection Time    01/05/13  7:16 AM      Result Value Range Status   Specimen Description URINE, RANDOM   Final   Special Requests Normal   Final   Culture  Setup Time     Final   Value: 01/05/2013 09:17     Performed at [facility]     Final   Value: NO GROWTH     Performed at [facility]   Culture     Final   Value: NO GROWTH     Performed at [facility]   Report Status 01/06/2013 FINAL   Final  CLOSTRIDIUM DIFFICILE BY PCR     Status: None   Collection Time    01/06/13  2:12 PM      Result Value Range Status   C difficile by pcr NEGATIVE  NEGATIVE  Final     Scheduled Meds: . enoxaparin (LOVENOX) injection  40 mg Subcutaneous Q24H  . feeding supplement  237 mL Oral BID BM  . folic acid  1 mg Oral Daily  . LORazepam  2 mg Oral TID  . multivitamin with minerals  1 tablet Oral Daily  . phosphorus  250 mg Oral TID  . potassium chloride  40 mEq Oral Daily  . sulfamethoxazole-trimethoprim  1 tablet Oral Q12H  . thiamine  100 mg Oral Daily   Continuous Infusions:   Conley Canal [facility] Pager [phone]. If 7PM-7AM, please contact night-coverage at [email or web address], password [facility] 01/12/2013, 12:54 PM  LOS: 8 days   Attending - Seen and examined, and agree with the above assessment and plan. I gave her Ativan, possible discharge tomorrow. Spoke with social work she does not qualify for inpatient detoxification, current plans are for outpatient alcohol rehabilitation.  S Ghimire

## 2013-01-12 NOTE — Clinical Social Work Psych Note (Addendum)
3:09pm- Psych CSW initiated rescission of IVC paperwork.  Psych CSW completed the appropriate state form and informed unit RN of MD signature being needed prior to d/c.    Psych CSW received a call from PA who requested I speak with mother (at bedside).  Psych CSW met with pt at bedside and spoke with mother in private.  Psych CSW educated both mother and pt on ETOH treatment and homelessness resources.  Mother stated that pt could not live with her.  Psych CSW educated mother on pt right to self determination and acknowledged the hurt and frustration that mother feels when pt doesn't make the decisions we feel are not the most healthy.  Mother and pt acknowledged understanding though very tearful re: current situation.  Psych CSW provided emotional support to mom.  Pt and mom appreciative of CSW assistance.  Full assessment to follow.  Psych CSW placed f/u with Vesta Mixer (med management/outpatient psychiatry) on the discharge summary.    Psych CSW contacted MD regarding disposition for pt upon d/c.  Psych CSW continuing to follow.  Vickii Penna, LCSWA [phone]  Clinical Social Work

## 2013-01-13 MED ORDER — SULFAMETHOXAZOLE-TMP DS 800-160 MG PO TABS: 1.0000 | ORAL_TABLET | Freq: Two times a day (BID) | ORAL | Status: DC

## 2013-01-13 MED ORDER — FOLIC ACID 1 MG PO TABS: 1.0000 mg | ORAL_TABLET | Freq: Every day | ORAL | Status: DC

## 2013-01-13 MED ORDER — LORAZEPAM 1 MG PO TABS: 1.0000 mg | ORAL_TABLET | Freq: Three times a day (TID) | ORAL | Status: DC | PRN

## 2013-01-13 MED ORDER — THIAMINE HCL 100 MG PO TABS: 100.0000 mg | ORAL_TABLET | Freq: Every day | ORAL | Status: DC

## 2013-01-13 MED ORDER — LORAZEPAM 1 MG PO TABS
1.0000 mg | ORAL_TABLET | Freq: Three times a day (TID) | ORAL | Status: DC
  Administered 2013-01-13: 1 mg via ORAL
  Filled 2013-01-13: qty 1

## 2013-01-13 NOTE — Discharge Summary (Addendum)
PATIENT DETAILS Name: Theodore Brown Age: 47 y.o. Sex: male Date of Birth: January 05, 1966 MRN: [id number]. Admit Date: 01/04/2013 Admitting Physician: Drema Dallas, MD PCP:No primary provider on file.  Recommendations for Outpatient Follow-up:  1. Needs outpatient resources for alcohol abuse rehabilitation 2. Patient needs to call the number provided by the social worker to see if bed available at rehabilitation 3. Needs ongoing counseling for alcohol abuse 4. Please check LFTs and CBC at next followup visit  PRIMARY DISCHARGE DIAGNOSIS:  Principal Problem:   Salmonella bacteremia Active Problems:   Hyponatremia   Hypokalemia   Alcohol dependence   Fever   Acute pancreatitis   SIRS (systemic inflammatory response syndrome)   Thrombocytopenia, unspecified   Alcohol withdrawal delirium   Alcoholic liver disease      PAST MEDICAL HISTORY: Past Medical History  Diagnosis Date  . Alcoholic     DISCHARGE MEDICATIONS:   Medication List         folic acid 1 MG tablet  Commonly known as:  FOLVITE  Take 1 tablet (1 mg total) by mouth daily.     LORazepam 1 MG tablet  Commonly known as:  ATIVAN  Take 1 tablet (1 mg total) by mouth every 8 (eight) hours as needed for anxiety.     sulfamethoxazole-trimethoprim 800-160 MG per tablet  Commonly known as:  BACTRIM DS  Take 1 tablet by mouth every 12 (twelve) hours.     thiamine 100 MG tablet  Take 1 tablet (100 mg total) by mouth daily.        ALLERGIES:  No Known Allergies  BRIEF HPI:  See H&P, Labs, Consult and Test reports for all details in brief, is a 47 year old male with history of alcohol abuse admitted with hospital on 9/2 with alcohol withdrawal and, alcoholic pancreatitis.  CONSULTATIONS:   pulmonary/intensive care and psychiatry  PERTINENT RADIOLOGIC STUDIES: Dg Chest 2 View  01/04/2013   CLINICAL DATA:  Fever, dizziness, shortness of Breath.  EXAM: CHEST  2 VIEW  COMPARISON:  None.  FINDINGS: The heart  size and mediastinal contours are within normal limits. Linear scarring in the lingula. Lungs otherwise clear. No effusions. The visualized skeletal structures are unremarkable.  IMPRESSION: No active cardiopulmonary disease.   Electronically Signed   By: Charlett Nose   On: 01/04/2013 09:26   Ct Head Wo Contrast  01/04/2013   *RADIOLOGY REPORT*  Clinical Data: Sudden onset weakness, shaking while walking up a flight of stairs  CT HEAD WITHOUT CONTRAST  Technique:  Contiguous axial images were obtained from the base of the skull through the vertex without contrast.  Comparison: None  Findings: Mild generalized atrophy. Normal ventricular morphology. No midline shift or mass effect. Otherwise normal appearance of brain parenchyma. No intracranial hemorrhage, mass lesion or evidence of acute infarction. No extra-axial fluid collections. Bones and sinuses unremarkable.  IMPRESSION: No acute intracranial abnormalities.   Original Report Authenticated By: Ulyses Southward, M.D.   US Abdomen Complete  01/04/2013   *RADIOLOGY REPORT*  Clinical Data:  Fever, elevated LFTs  COMPLETE ABDOMINAL ULTRASOUND  Comparison:  None.  Findings:  Gallbladder:  Contracted gallbladder.  No gallstones, gallbladder wall thickening, or pericholecystic fluid.  Negative sonographic Murphy's sign.  Common bile duct:  Measures 2 mm.  Liver:  No focal lesion identified.  Coarse, echogenic hepatic parenchyma.  Mildly nodular hepatic contour, equivocal.  IVC:  Appears normal.  Pancreas:  No focal abnormality seen.  Spleen:  Measures 7.3 cm.  Right Kidney:  Measures 10.8 cm.  No mass or hydronephrosis.  Left Kidney:  Measures 11.2 cm.  No mass or hydronephrosis.  Abdominal aorta:  No aneurysm identified.  Additional comments:  Trace perihepatic ascites.  IMPRESSION: Mildly nodular hepatic contour with coarse parenchymal echogenicity and trace perihepatic ascites.  While hepatic steatosis is possible, early cirrhosis is not excluded.   Original Report  Authenticated By: Charline Bills, M.D.     PERTINENT LAB RESULTS: CBC: No results found for this basename: WBC, HGB, HCT, PLT,  in the last 72 hours CMET CMP     Component Value Date/Time   NA 133* 01/12/2013 0510   K 4.1 01/12/2013 0510   CL 99 01/12/2013 0510   CO2 25 01/12/2013 0510   GLUCOSE 76 01/12/2013 0510   BUN 10 01/12/2013 0510   CREATININE 1.08 01/12/2013 0510   CALCIUM 9.9 01/12/2013 0510   PROT 7.4 01/12/2013 0510   ALBUMIN 3.3* 01/12/2013 0510   AST 130* 01/12/2013 0510   ALT 92* 01/12/2013 0510   ALKPHOS 187* 01/12/2013 0510   BILITOT 1.0 01/12/2013 0510   GFRNONAA 81* 01/12/2013 0510   GFRAA >90 01/12/2013 0510    GFR Estimated Creatinine Clearance: 60 ml/min (by C-G formula based on Cr of 1.08). No results found for this basename: LIPASE, AMYLASE,  in the last 72 hours No results found for this basename: CKTOTAL, CKMB, CKMBINDEX, TROPONINI,  in the last 72 hours No components found with this basename: POCBNP,  No results found for this basename: DDIMER,  in the last 72 hours No results found for this basename: HGBA1C,  in the last 72 hours No results found for this basename: CHOL, HDL, LDLCALC, TRIG, CHOLHDL, LDLDIRECT,  in the last 72 hours No results found for this basename: TSH, T4TOTAL, FREET3, T3FREE, THYROIDAB,  in the last 72 hours No results found for this basename: VITAMINB12, FOLATE, FERRITIN, TIBC, IRON, RETICCTPCT,  in the last 72 hours Coags: No results found for this basename: PT, INR,  in the last 72 hours Microbiology: Recent Results (from the past 240 hour(s))  CULTURE, BLOOD (ROUTINE X 2)     Status: None   Collection Time    01/04/13 10:10 AM      Result Value Range Status   Specimen Description BLOOD LEFT ANTECUBITAL   Final   Special Requests BOTTLES DRAWN AEROBIC ONLY [facility]   Final   Culture  Setup Time     Final   Value: 01/04/2013 14:46     Performed at [facility]   Culture     Final   Value: SALMONELLA SPECIES     Note:  SUSCEPTIBILITIES PERFORMED ON PREVIOUS CULTURE WITHIN THE LAST 5 DAYS. CRITICAL RESULT CALLED TO, READ BACK BY AND VERIFIED WITH: JESSIE Lolita Patella @ 1610 01/07/13 BY KRAWS Previous Specimen Sent to [facility] for Typing.     0241 Note: Gram Stain Report Called to,Read Back By and Verified With: DORA GARDNER 01/05/2013 [facility]     Performed at [facility]   Report Status 01/10/2013 FINAL   Final  CULTURE, BLOOD (ROUTINE X 2)     Status: None   Collection Time    01/04/13 10:20 AM      Result Value Range Status   Specimen Description BLOOD HAND LEFT   Final   Special Requests BOTTLES DRAWN AEROBIC ONLY 3CC   Final   Culture  Setup Time     Final   Value: 01/04/2013 14:46     Performed at  [facility]     Final   Value: SALMONELLA SPECIES     Note: SUSCEPTIBILITIES PERFORMED ON PREVIOUS CULTURE WITHIN THE LAST 5 DAYS. CRITICAL RESULT CALLED TO, READ BACK BY AND VERIFIED WITH: Trudee Grip @ 9562 01/07/13 BY KRAWS Previous Specimen Sent to [facility] for Typing.     Note: Gram Stain Report Called to,Read Back By and Verified With: Marlaine Hind [id number] BRMEL     Performed at [facility]   Report Status 01/10/2013 FINAL   Final  CULTURE, BLOOD (ROUTINE X 2)     Status: None   Collection Time    01/04/13  8:40 PM      Result Value Range Status   Specimen Description BLOOD LEFT HAND   Final   Special Requests BOTTLES DRAWN AEROBIC ONLY 5CC   Final   Culture  Setup Time     Final   Value: 01/05/2013 02:42  CRITICAL RESULT CALLED TO, READ BACK BY AND VERIFIED WITH: JESSIE WESSELINK @ 4696 01/07/13 BY KRAWS FAXED TO [facility] @1100  01/07/13 BY MODAN Referred to [facility] in [place], [place] for      Serotyping.     Performed at [facility]     Final   Value: SALMONELLA SPECIES     Note: CRITICAL RESULT CALLED TO, READ BACK BY AND VERIFIED WITH: JESSIE Lolita Patella @ 2952 01/07/13 BY KRAWS FAXED TO  [facility] @1100  01/07/13 BY MODAN Referred to [facility] in [place], [place] for Serotyping.     Note: Gram Stain Report Called to,Read Back By and Verified With: AVERI B @ 1452 01/05/13 BY KRAWS     Performed at [facility]   Report Status PENDING   Incomplete   Organism ID, Bacteria SALMONELLA SPECIES   Final  CULTURE, BLOOD (ROUTINE X 2)     Status: None   Collection Time    01/04/13  8:45 PM      Result Value Range Status   Specimen Description BLOOD LEFT ARM   Final   Special Requests BOTTLES DRAWN AEROBIC ONLY 5CC   Final   Culture  Setup Time     Final   Value: 01/05/2013 02:41     Performed at [facility]   Culture     Final   Value: SALMONELLA SPECIES     Note: SUSCEPTIBILITIES PERFORMED ON PREVIOUS CULTURE WITHIN THE LAST 5 DAYS. CRITICAL RESULT CALLED TO, READ BACK BY AND VERIFIED WITH: Trudee Grip @ 8413 01/07/13 BY KRAWS Previous Specimen Sent to [facility] for Typing.     Note: Gram Stain Report Called to,Read Back By and Verified With: AVERI B @ 1452 01/05/13 BY KRAWS     Performed at [facility]   Report Status 01/10/2013 FINAL   Final  URINE CULTURE     Status: None   Collection Time    01/05/13  7:16 AM      Result Value Range Status   Specimen Description URINE, RANDOM   Final   Special Requests Normal   Final   Culture  Setup Time     Final   Value: 01/05/2013 09:17     Performed at [facility]     Final   Value: NO GROWTH     Performed at [facility]  Final   Value: NO GROWTH     Performed at [facility]   Report Status 01/06/2013 FINAL   Final  CLOSTRIDIUM DIFFICILE BY PCR     Status: None   Collection Time    01/06/13  2:12 PM      Result Value Range Status   C difficile by pcr NEGATIVE  NEGATIVE Final     BRIEF HOSPITAL COURSE:   Brief summary - Patient is a 47 year old African American male who is homeless, with a  long-standing history of alcohol dependence presented to the emergency room on 9/2 after falling down a flight of stairs. He was noted to be febrile. Cultures were obtained, grew out pan sensitive Salmonella, he was initially placed on ciprofloxacin, but then changed later to Bactrim. He no longer febrile. Hospital course was complicated by protracted alcohol withdrawal and delirium. Currently much better, and is thought stable to be discharged home. Social worker, Algis Downs P-AC, and myself have had multiple discussions with the patient's mother, patient's sister Ms. Whiteside over the phone and also at bedside. Outpatient resources have been provided to the patient, family and patient are well aware that currently patient has met maximum benefit from his inpatient hospital stay, he is completely alert, awake and oriented x3. Answers all questions appropriately. He is to call  rehabilitation every day, to see if they have a bed available for him for inpatient rehabilitation, as of today per social worker waiting time is 11 days. Patient is ambulating in the hallway, and it is a stable, he is requesting discharge. His cousin, Dondra Prader is at bedside and was taken home. Please note patient called his cousin to come and pick him up.  See below for Hospital course by problem list    Salmonella bacteremia - As above, he was febrile initially. Blood cultures have grown out Salmonella, initially he was on ciprofloxacin for a few days, and was transitioned to Bactrim on 9/7, he will continue with Bactrim on discharge.  Alcohol withdrawal with delirium tremens - Patient was placed on Ativan for alcohol withdrawal protocol, he was also on MVI, thiamine and folate. Unfortunately, his hospital course was complicated by protracted alcohol withdrawal with delirium and confusion. He required high doses of Ativan, psychiatric consultation during this inpatient admission. Over the past 2 days, we have tapered down  his Ativan significantly, by the day of discharge patient is awake and alert. He has been counseled extensively, long discussions were held with the patient, family by the social worker an outpatient resources have been provided to the family this has all been explained in great detail.  Social worker has had bedside conversation with the patient's mother as well. Family was requesting inpatient rehabilitation, psychiatry assessed the patient and deemed that the patient did not need  transfer to behavioral [facility] Center. Outpatient rehabilitation  facilities for substance abuse were contacted, unfortunately no beds were immediately available, as of today, patient is on a waiting list for rehab beds. As of today, he is still 11 days out before a bed before a bed is available. Family was clearly told this by me, by the social worker and by Ms. York PA-C. We have informed the family, that the patient at this time it is thought to have capacity to make decisions (please see psych consultation), and he (i.e. patient) needs to call the facilities daily to see if a bed is available sooner. Both the patient and the family were informed of this and  were agreeable. I also asked the family to see if they would provide him with support, to see if they would take him into thir house (as he is homeless) for a few days till a bed at the outpatient rehabilitation facility was available, patient's sister unfortunately could not accommodate him. Patient's mother had indicated to the social worker that she would also not have him either. At this time patient has called his cousin, Mr. Alben Spittle who is currently at bedside who he claims that will live with. I spoken with the patient's sister Ms. Whitehouse twice today, I have answered all of her questions to the best of my ability. I have explicitly told her that, patient is not being discharged because he cannot "Pay", as this was her concern. I've also told her that patient is stable,  and has met maximum benefit from this inpatient admission, needs continued support from family, needs continued outpatient rehabilitation. As noted above, patient is awake alert, has no focal deficits and is ambulating in the hallways without any assistance.Unfortunately he is homeless, family at this time cannot accommodate him in their house. - Discharge with Ativan prn. Patient has been told that he should not use benzodiazepines and alcohol at the same time.  Alcoholic liver disease:  -LFTs improving down trending.  -Nodular liver contour on imaging - Further workup will be done in the outpatient setting when the patient follows up at the weather center.  Thrombocytopenia  Resolved.  Initially felt to be due to alcoholic cirrhosis, but may have just been related to infection.   TODAY-DAY OF DISCHARGE:  Subjective:   Jonovan Janee Morn today has no headache,no chest abdominal pain,no new weakness tingling or numbness, feels much better wants to go home today.   Objective:   Blood pressure 100/65, pulse 82, temperature 97.8 F (36.6 C), temperature source Oral, resp. rate 16, height 5\' 6"  (1.676 m), weight 49.578 kg (109 lb 4.8 oz), SpO2 100.00%.  Intake/Output Summary (Last 24 hours) at 01/13/13 1226 Last data filed at 01/12/13 1903  Gross per 24 hour  Intake    222 ml  Output      0 ml  Net    222 ml   Filed Weights   01/04/13 0857 01/06/13 0657  Weight: 58.968 kg (130 lb) 49.578 kg (109 lb 4.8 oz)    Exam Awake Alert, Oriented *3, No new F.N deficits, Normal affect .AT,PERRAL Supple Neck,No JVD, No cervical lymphadenopathy appriciated.  Symmetrical Chest wall movement, Good air movement bilaterally, CTAB RRR,No Gallops,Rubs or new Murmurs, No Parasternal Heave +ve B.Sounds, Abd Soft, Non tender, No organomegaly appriciated, No rebound -guarding or rigidity. No Cyanosis, Clubbing or edema, No new Rash or bruise  DISCHARGE CONDITION: Stable  DISPOSITION: Home-  patient claims he lives with his cousin-Mr. Dondra Prader.  DISCHARGE INSTRUCTIONS:    Activity:  As tolerated   Diet recommendation: Regular Diet       Discharge Orders   Future Appointments Provider Department Dept Phone   01/25/2013 11:00 AM Jeanann Lewandowsky, MD [facility] [phone]   01/25/2013 11:30 AM [employer] [facility] [facility] [phone]   Future Orders Complete By Expires   Call MD for:  extreme fatigue  As directed    Call MD for:  persistant dizziness or light-headedness  As directed    Diet - low sodium heart healthy  As directed    Increase activity slowly  As directed  Follow-up Information   Follow up with [facility] On 01/25/2013. (11 am for hosp f/u, bring $20 co pay ,if you do not have still come to appt)    Contact information:   [address] [place] [address] [phone]      Follow up with [facility] On 01/25/2013. (11:30 orange card apt please bring completed paperwork)    Contact information:   [address] [place] [address] [phone]      Follow up with [facility]. (Walk-ins Monday-Friday 8:30-3:30.  [facility] can provide assistance with medication management, and outpatient psychiatric services.  )    Contact information:   [address] [place] [address] [phone]      Follow up with 1-800-273-TALK (8255). (24 hours per day 7 days per week, this phone line provides assistance to those who find themselves hopeless in need of assitance. )       Total Time spent on discharge equals 45 minutes. SignedJeoffrey Massed 01/13/2013 12:26 PM

## 2013-01-13 NOTE — Clinical Social Work Psych Note (Signed)
Psych CSW received a call from PA re: disposition plan.  Psych CSW contacted mother, per PA request.  Psych CSW had conversation with mother again re: pt responsibility for f/u upon d/c.  Mother was concerned about pt not taking his Ativan once d/c.  Psych CSW inquired of mom if pt could stay with her for the couple of days that pt would be tapering off of Ativan.  Mom refused.  Psych CSW inquired if sister could possibly let him stay, mom reports sister refused.  Pt was homeless prior to hospitalization and has knowledge and experience with homelessness resources.  Per RNCM, pt has transportation upon d/c.    Psych CSW is signing off.  Vickii Penna, LCSWA [phone]  Clinical Social Work

## 2013-01-13 NOTE — Progress Notes (Signed)
Discharge instructions reviewed with patient and family.  Mother stopped by to meet with family about arrangements for home.  PIV removed.  Pt taken to discharge location at [facility].

## 2013-01-13 NOTE — Progress Notes (Signed)
Pt ambulated in hallway several yards.  Denied any complaints of pain or SOB.  Gait stable.

## 2013-01-17 NOTE — Progress Notes (Signed)
Evaluated Theodore Brown at bedside, agree with above, having visual/auditory halucinations and not oriented to situation, place, or time.  Feel that he does not have decision making capacity at this time as a result and being clearly in delerium tremens have filled out IVC paperwork emergently.  Attempted to contact patients mother who had been at bedside earlier in the day but could not reach her (although not unexpected given the hour of night it is currently).  Patient doing somewhat better after ativan at this time.

## 2013-01-25 ENCOUNTER — Ambulatory Visit: Payer: Self-pay

## 2013-02-03 ENCOUNTER — Ambulatory Visit: Payer: Medicaid Other | Attending: Internal Medicine | Admitting: Internal Medicine

## 2013-02-03 ENCOUNTER — Ambulatory Visit: Payer: Medicaid Other

## 2013-02-03 ENCOUNTER — Encounter: Payer: Self-pay | Admitting: Internal Medicine

## 2013-02-03 VITALS — BP 119/84 | HR 73 | Temp 98.0°F | Resp 16 | Ht 66.54 in | Wt 114.0 lb

## 2013-02-03 DIAGNOSIS — G43909 Migraine, unspecified, not intractable, without status migrainosus: Secondary | ICD-10-CM | POA: Insufficient documentation

## 2013-02-03 DIAGNOSIS — M1990 Unspecified osteoarthritis, unspecified site: Secondary | ICD-10-CM

## 2013-02-03 DIAGNOSIS — D696 Thrombocytopenia, unspecified: Secondary | ICD-10-CM

## 2013-02-03 DIAGNOSIS — K859 Acute pancreatitis without necrosis or infection, unspecified: Secondary | ICD-10-CM

## 2013-02-03 DIAGNOSIS — M129 Arthropathy, unspecified: Secondary | ICD-10-CM

## 2013-02-03 DIAGNOSIS — K709 Alcoholic liver disease, unspecified: Secondary | ICD-10-CM

## 2013-02-03 DIAGNOSIS — F1020 Alcohol dependence, uncomplicated: Secondary | ICD-10-CM

## 2013-02-03 MED ORDER — GABAPENTIN 100 MG PO CAPS: 100.0000 mg | ORAL_CAPSULE | Freq: Every day | ORAL | Status: DC

## 2013-02-03 MED ORDER — TRAMADOL HCL 50 MG PO TABS: 50.0000 mg | ORAL_TABLET | Freq: Three times a day (TID) | ORAL | Status: DC | PRN

## 2013-02-03 MED ORDER — MEGESTROL ACETATE 20 MG PO TABS: 20.0000 mg | ORAL_TABLET | Freq: Every day | ORAL | Status: DC

## 2013-02-03 NOTE — Progress Notes (Signed)
Pt is here to establish care. Pt reports of having poor eye sight. When the pt turns his head a certain way give him a migraine and this occurs twice a week. Pt said he has high anxiety  He also reports having arthritis
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
## 2013-02-11 LAB — CULTURE, BLOOD (ROUTINE X 2)

## 2013-04-14 ENCOUNTER — Ambulatory Visit: Payer: Self-pay

## 2013-05-10 ENCOUNTER — Ambulatory Visit: Payer: Self-pay

## 2013-11-21 ENCOUNTER — Encounter: Payer: Self-pay | Admitting: Internal Medicine

## 2013-11-21 ENCOUNTER — Ambulatory Visit: Payer: Medicaid Other | Attending: Internal Medicine | Admitting: Internal Medicine

## 2013-11-21 VITALS — BP 115/67 | HR 92 | Temp 99.3°F | Resp 18 | Ht 66.5 in | Wt 117.0 lb

## 2013-11-21 DIAGNOSIS — M19049 Primary osteoarthritis, unspecified hand: Secondary | ICD-10-CM | POA: Diagnosis not present

## 2013-11-21 DIAGNOSIS — F3289 Other specified depressive episodes: Secondary | ICD-10-CM | POA: Insufficient documentation

## 2013-11-21 DIAGNOSIS — K089 Disorder of teeth and supporting structures, unspecified: Secondary | ICD-10-CM

## 2013-11-21 DIAGNOSIS — Z79899 Other long term (current) drug therapy: Secondary | ICD-10-CM | POA: Insufficient documentation

## 2013-11-21 DIAGNOSIS — F329 Major depressive disorder, single episode, unspecified: Secondary | ICD-10-CM | POA: Diagnosis not present

## 2013-11-21 DIAGNOSIS — F32A Depression, unspecified: Secondary | ICD-10-CM

## 2013-11-21 DIAGNOSIS — R51 Headache: Secondary | ICD-10-CM | POA: Insufficient documentation

## 2013-11-21 DIAGNOSIS — F411 Generalized anxiety disorder: Secondary | ICD-10-CM | POA: Diagnosis not present

## 2013-11-21 DIAGNOSIS — M1990 Unspecified osteoarthritis, unspecified site: Secondary | ICD-10-CM

## 2013-11-21 DIAGNOSIS — M542 Cervicalgia: Secondary | ICD-10-CM | POA: Diagnosis not present

## 2013-11-21 DIAGNOSIS — H538 Other visual disturbances: Secondary | ICD-10-CM | POA: Diagnosis not present

## 2013-11-21 DIAGNOSIS — F1010 Alcohol abuse, uncomplicated: Secondary | ICD-10-CM | POA: Insufficient documentation

## 2013-11-21 DIAGNOSIS — M129 Arthropathy, unspecified: Secondary | ICD-10-CM

## 2013-11-21 MED ORDER — TRAMADOL HCL 50 MG PO TABS: 50.0000 mg | ORAL_TABLET | Freq: Two times a day (BID) | ORAL | Status: DC | PRN

## 2013-11-21 MED ORDER — FOLIC ACID 1 MG PO TABS: 1.0000 mg | ORAL_TABLET | Freq: Every day | ORAL | Status: DC

## 2013-11-21 NOTE — Progress Notes (Signed)
Patient presents for arthritis pain both hands; rates 10/10 at present. States he has been out of all meds for 9 months Requesting dental referral and eye referral. Patient is teary. States he is "stressed out." Referred to CSW for PHQ-9 score of 21

## 2013-11-21 NOTE — Patient Instructions (Addendum)
Arthritis, Nonspecific Arthritis is pain, redness, warmth, or puffiness (inflammation) of a joint. The joint may be stiff or hurt when you move it. One or more joints may be affected. There are many types of arthritis. Your doctor may not know what type you have right away. The most common cause of arthritis is wear and tear on the joint (osteoarthritis). HOME CARE   Only take medicine as told by your doctor.  Rest the joint as much as possible.  Raise (elevate) your joint if it is puffy.  Use crutches if the painful joint is in your leg.  Drink enough fluids to keep your pee (urine) clear or pale yellow.  Follow your doctor's diet instructions.  Use cold packs for very bad joint pain for 10 to 15 minutes every hour. Ask your doctor if it is okay for you to use hot packs.  Exercise as told by your doctor.  Take a warm shower if you have stiffness in the morning.  Move your sore joints throughout the day. GET HELP RIGHT AWAY IF:   You have a fever.  You have very bad joint pain, puffiness, or redness.  You have many joints that are painful and puffy.  You are not getting better with treatment.  You have very bad back pain or leg weakness.  You cannot control when you poop (bowel movement) or pee (urinate).  You do not feel better in 24 hours or are getting worse.  You are having side effects from your medicine. MAKE SURE YOU:   Understand these instructions.  Will watch your condition.  Will get help right away if you are not doing well or get worse. Document Released: 07/16/2009 Document Revised: 10/21/2011 Document Reviewed: 07/16/2009 [facility] Patient Information 2015 [place], [place]. This information is not intended to replace advice given to you by your health care provider. Make sure you discuss any questions you have with your health care provider. Generalized Anxiety Disorder Generalized anxiety disorder (GAD) is a mental disorder. It interferes with life  functions, including relationships, work, and school. GAD is different from normal anxiety, which everyone experiences at some point in their lives in response to specific life events and activities. Normal anxiety actually helps [place] prepare for and get through these life events and activities. Normal anxiety goes away after the event or activity is over.  GAD causes anxiety that is not necessarily related to specific events or activities. It also causes excess anxiety in proportion to specific events or activities. The anxiety associated with GAD is also difficult to control. GAD can vary from mild to severe. People with severe GAD can have intense waves of anxiety with physical symptoms (panic attacks).  SYMPTOMS The anxiety and worry associated with GAD are difficult to control. This anxiety and worry are related to many life events and activities and also occur more days than not for 6 months or longer. People with GAD also have three or more of the following symptoms (one or more in children):  Restlessness.   Fatigue.  Difficulty concentrating.   Irritability.  Muscle tension.  Difficulty sleeping or unsatisfying sleep. DIAGNOSIS GAD is diagnosed through an assessment by your caregiver. Your caregiver will ask you questions aboutyour mood,physical symptoms, and events in your life. Your caregiver may ask you about your medical history and use of alcohol or drugs, including prescription medications. Your caregiver may also do a physical exam and blood tests. Certain medical conditions and the use of certain substances can cause symptoms  similar to those associated with GAD. Your caregiver may refer you to a mental health specialist for further evaluation. TREATMENT The following therapies are usually used to treat GAD:   Medication--Antidepressant medication usually is prescribed for long-term daily control. Antianxiety medications may be added in severe cases, especially when panic  attacks occur.   Talk therapy (psychotherapy)--Certain types of talk therapy can be helpful in treating GAD by providing support, education, and guidance. A form of talk therapy called cognitive behavioral therapy can teach you healthy ways to think about and react to daily life events and activities.  Stress managementtechniques--These include yoga, meditation, and exercise and can be very helpful when they are practiced regularly. A mental health specialist can help determine which treatment is best for you. Some people see improvement with one therapy. However, other people require a combination of therapies. Document Released: 08/16/2012 Document Reviewed: 08/16/2012 [facility] Patient Information 2015 [place]. This information is not intended to replace advice given to you by your health care provider. Make sure you discuss any questions you have with your health care provider.
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
## 2013-11-23 ENCOUNTER — Other Ambulatory Visit: Payer: Self-pay

## 2013-11-24 ENCOUNTER — Ambulatory Visit (HOSPITAL_COMMUNITY): Admission: RE | Admit: 2013-11-24 | Payer: Medicaid Other | Source: Ambulatory Visit

## 2013-11-28 ENCOUNTER — Other Ambulatory Visit: Payer: Self-pay

## 2013-12-02 ENCOUNTER — Other Ambulatory Visit: Payer: Self-pay

## 2013-12-08 ENCOUNTER — Other Ambulatory Visit: Payer: Self-pay

## 2013-12-12 ENCOUNTER — Other Ambulatory Visit: Payer: Self-pay

## 2013-12-15 ENCOUNTER — Ambulatory Visit: Payer: Medicaid Other | Attending: Internal Medicine

## 2013-12-15 DIAGNOSIS — Z0000 Encounter for general adult medical examination without abnormal findings: Secondary | ICD-10-CM

## 2013-12-15 LAB — POCT GLYCOSYLATED HEMOGLOBIN (HGB A1C): HEMOGLOBIN A1C: 4.1

## 2013-12-16 LAB — LIPID PANEL
CHOL/HDL RATIO: 3.6 ratio
Cholesterol: 126 mg/dL (ref 0–200)
HDL: 35 mg/dL — AB (ref >39)
LDL CALC: 18 mg/dL (ref 0–99)
Triglycerides: 364 mg/dL — ABNORMAL HIGH (ref <150)
VLDL: 73 mg/dL — AB (ref 0–40)

## 2013-12-27 ENCOUNTER — Other Ambulatory Visit: Payer: Self-pay | Admitting: Internal Medicine

## 2014-07-17 ENCOUNTER — Emergency Department (HOSPITAL_COMMUNITY)
Admission: EM | Admit: 2014-07-17 | Discharge: 2014-07-17 | Disposition: A | Discharge status: Home / Self Care | Payer: Medicaid Other

## 2014-07-17 NOTE — ED Notes (Signed)
Pt called for triage with no answer x1. 

## 2014-07-17 NOTE — ED Notes (Signed)
Called unable to locate patient at this time

## 2014-07-17 NOTE — ED Notes (Signed)
Pt did not answer x 3 

## 2014-07-17 NOTE — ED Notes (Signed)
Pt called in main ED waiting area with no response; Minerva AreolaEric, RN present

## 2014-07-17 NOTE — ED Notes (Signed)
Pt called for triage with no answer x 2 

## 2014-09-20 ENCOUNTER — Encounter (HOSPITAL_COMMUNITY): Payer: Self-pay | Admitting: Family Medicine

## 2014-09-20 ENCOUNTER — Emergency Department (HOSPITAL_COMMUNITY)
Admission: EM | Admit: 2014-09-20 | Discharge: 2014-09-20 | Disposition: A | Discharge status: Home / Self Care | Payer: Medicaid Other | Attending: Emergency Medicine | Admitting: Emergency Medicine

## 2014-09-20 DIAGNOSIS — R202 Paresthesia of skin: Secondary | ICD-10-CM | POA: Insufficient documentation

## 2014-09-20 DIAGNOSIS — M1990 Unspecified osteoarthritis, unspecified site: Secondary | ICD-10-CM

## 2014-09-20 DIAGNOSIS — F419 Anxiety disorder, unspecified: Secondary | ICD-10-CM | POA: Insufficient documentation

## 2014-09-20 DIAGNOSIS — M62838 Other muscle spasm: Secondary | ICD-10-CM | POA: Insufficient documentation

## 2014-09-20 DIAGNOSIS — Z79899 Other long term (current) drug therapy: Secondary | ICD-10-CM | POA: Insufficient documentation

## 2014-09-20 DIAGNOSIS — Z792 Long term (current) use of antibiotics: Secondary | ICD-10-CM | POA: Insufficient documentation

## 2014-09-20 MED ORDER — TRAMADOL HCL 50 MG PO TABS
50.0000 mg | ORAL_TABLET | Freq: Once | ORAL | Status: DC
  Filled 2014-09-20: qty 1

## 2014-09-20 MED ORDER — TRAMADOL HCL 50 MG PO TABS: 50.0000 mg | ORAL_TABLET | Freq: Two times a day (BID) | ORAL | Status: DC | PRN

## 2014-09-20 MED ORDER — CYCLOBENZAPRINE HCL 10 MG PO TABS
5.0000 mg | ORAL_TABLET | Freq: Once | ORAL | Status: DC
  Filled 2014-09-20: qty 1

## 2014-09-20 MED ORDER — CYCLOBENZAPRINE HCL 10 MG PO TABS: 10.0000 mg | ORAL_TABLET | Freq: Two times a day (BID) | ORAL | Status: DC | PRN
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
## 2014-09-20 NOTE — ED Notes (Signed)
Sling applied to left arm. Pt verbalizes understanding of sling. Pt states that sling feels "good."

## 2014-09-20 NOTE — Discharge Instructions (Signed)

## 2014-09-20 NOTE — ED Notes (Signed)
Pt sts he was moving furniture last week and since has been having left arm pain and numbness radiating into neck and shoulder.

## 2014-09-22 ENCOUNTER — Ambulatory Visit: Payer: Self-pay | Admitting: Family Medicine

## 2014-10-10 ENCOUNTER — Other Ambulatory Visit: Payer: Self-pay

## 2014-10-10 ENCOUNTER — Encounter: Payer: Self-pay | Admitting: Internal Medicine

## 2014-10-10 ENCOUNTER — Ambulatory Visit: Payer: Self-pay | Attending: Family Medicine | Admitting: Internal Medicine

## 2014-10-10 VITALS — BP 130/79 | HR 104 | Temp 98.9°F | Ht 65.0 in | Wt 123.2 lb

## 2014-10-10 DIAGNOSIS — R0789 Other chest pain: Secondary | ICD-10-CM | POA: Insufficient documentation

## 2014-10-10 DIAGNOSIS — H269 Unspecified cataract: Secondary | ICD-10-CM | POA: Insufficient documentation

## 2014-10-10 DIAGNOSIS — R200 Anesthesia of skin: Secondary | ICD-10-CM | POA: Insufficient documentation

## 2014-10-10 DIAGNOSIS — M79641 Pain in right hand: Secondary | ICD-10-CM | POA: Insufficient documentation

## 2014-10-10 DIAGNOSIS — M79603 Pain in arm, unspecified: Secondary | ICD-10-CM | POA: Insufficient documentation

## 2014-10-10 DIAGNOSIS — M545 Low back pain, unspecified: Secondary | ICD-10-CM

## 2014-10-10 DIAGNOSIS — W3400XS Accidental discharge from unspecified firearms or gun, sequela: Secondary | ICD-10-CM | POA: Insufficient documentation

## 2014-10-10 DIAGNOSIS — R079 Chest pain, unspecified: Secondary | ICD-10-CM

## 2014-10-10 DIAGNOSIS — M1990 Unspecified osteoarthritis, unspecified site: Secondary | ICD-10-CM

## 2014-10-10 DIAGNOSIS — M549 Dorsalgia, unspecified: Secondary | ICD-10-CM | POA: Insufficient documentation

## 2014-10-10 DIAGNOSIS — W458XXS Other foreign body or object entering through skin, sequela: Secondary | ICD-10-CM | POA: Insufficient documentation

## 2014-10-10 DIAGNOSIS — M79642 Pain in left hand: Secondary | ICD-10-CM | POA: Insufficient documentation

## 2014-10-10 MED ORDER — CYCLOBENZAPRINE HCL 10 MG PO TABS: 10.0000 mg | ORAL_TABLET | Freq: Two times a day (BID) | ORAL | Status: DC | PRN

## 2014-10-10 MED ORDER — MELOXICAM 15 MG PO TABS: 15.0000 mg | ORAL_TABLET | Freq: Every day | ORAL | Status: DC
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
## 2014-10-11 ENCOUNTER — Encounter: Payer: Self-pay | Admitting: Internal Medicine

## 2014-10-12 ENCOUNTER — Telehealth: Payer: Self-pay | Admitting: Clinical

## 2014-10-12 NOTE — Telephone Encounter (Signed)
No message, voicemail not set up

## 2015-05-30 ENCOUNTER — Ambulatory Visit: Payer: Self-pay | Admitting: Internal Medicine

## 2015-06-08 ENCOUNTER — Ambulatory Visit: Payer: Self-pay | Admitting: Internal Medicine

## 2015-08-09 ENCOUNTER — Ambulatory Visit: Payer: Self-pay | Admitting: Internal Medicine

## 2015-09-03 ENCOUNTER — Ambulatory Visit: Payer: Self-pay | Admitting: Family Medicine

## 2015-09-17 ENCOUNTER — Ambulatory Visit: Payer: Self-pay | Admitting: Family Medicine

## 2015-11-29 ENCOUNTER — Encounter (HOSPITAL_COMMUNITY): Payer: Self-pay | Admitting: Emergency Medicine

## 2015-11-29 ENCOUNTER — Ambulatory Visit (HOSPITAL_COMMUNITY)
Admission: EM | Admit: 2015-11-29 | Discharge: 2015-11-29 | Disposition: A | Discharge status: Home / Self Care | Payer: Medicaid Other | Attending: Family Medicine | Admitting: Family Medicine

## 2015-11-29 DIAGNOSIS — H938X3 Other specified disorders of ear, bilateral: Secondary | ICD-10-CM | POA: Diagnosis not present

## 2015-11-29 DIAGNOSIS — R17 Unspecified jaundice: Secondary | ICD-10-CM

## 2015-11-29 DIAGNOSIS — Z8719 Personal history of other diseases of the digestive system: Secondary | ICD-10-CM

## 2015-11-29 DIAGNOSIS — F1099 Alcohol use, unspecified with unspecified alcohol-induced disorder: Secondary | ICD-10-CM

## 2015-11-29 DIAGNOSIS — IMO0002 Reserved for concepts with insufficient information to code with codable children: Secondary | ICD-10-CM

## 2015-11-29 MED ORDER — CETIRIZINE HCL 10 MG PO TABS: 10.0000 mg | ORAL_TABLET | Freq: Every day | ORAL | 0 refills | Status: DC

## 2015-11-29 MED ORDER — FLUTICASONE PROPIONATE 50 MCG/ACT NA SUSP: 2.0000 | Freq: Every day | NASAL | 2 refills | Status: DC
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
## 2015-11-29 NOTE — ED Triage Notes (Signed)
The patient presented to the [facility] with a complaint of bilateral ear fullness as well as some abnormality with his abdomen x 2 months.

## 2016-01-14 ENCOUNTER — Ambulatory Visit (HOSPITAL_COMMUNITY)
Admission: EM | Admit: 2016-01-14 | Discharge: 2016-01-14 | Disposition: A | Discharge status: Home / Self Care | Payer: Medicaid Other | Attending: Family Medicine | Admitting: Family Medicine

## 2016-01-14 ENCOUNTER — Encounter (HOSPITAL_COMMUNITY): Payer: Self-pay | Admitting: Family Medicine

## 2016-01-14 ENCOUNTER — Ambulatory Visit (HOSPITAL_COMMUNITY): Payer: Medicaid Other

## 2016-01-14 DIAGNOSIS — Z23 Encounter for immunization: Secondary | ICD-10-CM | POA: Diagnosis not present

## 2016-01-14 DIAGNOSIS — S6991XA Unspecified injury of right wrist, hand and finger(s), initial encounter: Secondary | ICD-10-CM | POA: Diagnosis not present

## 2016-01-14 MED ORDER — TETANUS-DIPHTH-ACELL PERTUSSIS 5-2.5-18.5 LF-MCG/0.5 IM SUSP
INTRAMUSCULAR | Status: AC
  Filled 2016-01-14: qty 0.5

## 2016-01-14 MED ORDER — TETANUS-DIPHTH-ACELL PERTUSSIS 5-2.5-18.5 LF-MCG/0.5 IM SUSP
0.5000 mL | Freq: Once | INTRAMUSCULAR | Status: AC
  Administered 2016-01-14: 0.5 mL via INTRAMUSCULAR

## 2016-01-14 NOTE — Discharge Instructions (Signed)
Leave bandaged until wed night then bandaid as needed. Return if any problems.
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
## 2016-01-14 NOTE — ED Triage Notes (Signed)
Pt here with injury to right middle finger. sts that he got his finger jammed in the door last night night and then ripped it out.

## 2016-01-24 ENCOUNTER — Emergency Department (HOSPITAL_COMMUNITY)
Admission: EM | Admit: 2016-01-24 | Discharge: 2016-01-25 | Disposition: A | Discharge status: Home / Self Care | Payer: Medicaid Other | Attending: Emergency Medicine | Admitting: Emergency Medicine

## 2016-01-24 ENCOUNTER — Encounter (HOSPITAL_COMMUNITY): Payer: Self-pay | Admitting: Emergency Medicine

## 2016-01-24 DIAGNOSIS — R109 Unspecified abdominal pain: Secondary | ICD-10-CM | POA: Diagnosis present

## 2016-01-24 DIAGNOSIS — K439 Ventral hernia without obstruction or gangrene: Secondary | ICD-10-CM | POA: Diagnosis not present

## 2016-01-24 LAB — LIPASE, BLOOD: LIPASE: 68 U/L — AB (ref 11–51)

## 2016-01-24 LAB — COMPREHENSIVE METABOLIC PANEL
ALT: 61 U/L (ref 17–63)
ANION GAP: 4 — AB (ref 5–15)
AST: 150 U/L — ABNORMAL HIGH (ref 15–41)
Albumin: 2.1 g/dL — ABNORMAL LOW (ref 3.5–5.0)
Alkaline Phosphatase: 217 U/L — ABNORMAL HIGH (ref 38–126)
BUN: 8 mg/dL (ref 6–20)
CHLORIDE: 105 mmol/L (ref 101–111)
CO2: 26 mmol/L (ref 22–32)
CREATININE: 0.93 mg/dL (ref 0.61–1.24)
Calcium: 8.4 mg/dL — ABNORMAL LOW (ref 8.9–10.3)
Glucose, Bld: 95 mg/dL (ref 65–99)
POTASSIUM: 3.8 mmol/L (ref 3.5–5.1)
SODIUM: 135 mmol/L (ref 135–145)
Total Bilirubin: 2.1 mg/dL — ABNORMAL HIGH (ref 0.3–1.2)
Total Protein: 6.7 g/dL (ref 6.5–8.1)

## 2016-01-24 LAB — CBC
HEMATOCRIT: 28 % — AB (ref 39.0–52.0)
HEMOGLOBIN: 9.1 g/dL — AB (ref 13.0–17.0)
MCH: 31.1 pg (ref 26.0–34.0)
MCHC: 32.5 g/dL (ref 30.0–36.0)
MCV: 95.6 fL (ref 78.0–100.0)
PLATELETS: 68 10*3/uL — AB (ref 150–400)
RBC: 2.93 MIL/uL — AB (ref 4.22–5.81)
RDW: 16 % — ABNORMAL HIGH (ref 11.5–15.5)
WBC: 6.2 10*3/uL (ref 4.0–10.5)

## 2016-01-24 NOTE — ED Triage Notes (Signed)
Pt sent here from urgent care for abd swelling-- and "hernia check "-- abd distended, only painful near umbilical area-- drinks "up to a 6 pack /day-- "
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
## 2016-01-25 NOTE — ED Notes (Signed)
Pt stable, ambulatory, states understanding of discharge instructions 

## 2016-09-16 ENCOUNTER — Encounter (HOSPITAL_COMMUNITY): Payer: Self-pay | Admitting: Family Medicine

## 2016-09-16 ENCOUNTER — Ambulatory Visit (HOSPITAL_COMMUNITY)
Admission: EM | Admit: 2016-09-16 | Discharge: 2016-09-16 | Disposition: A | Discharge status: Nursing Home (Includes ICF) | Payer: Medicaid Other

## 2016-09-16 DIAGNOSIS — F1290 Cannabis use, unspecified, uncomplicated: Secondary | ICD-10-CM | POA: Diagnosis present

## 2016-09-16 DIAGNOSIS — F1010 Alcohol abuse, uncomplicated: Secondary | ICD-10-CM | POA: Diagnosis not present

## 2016-09-16 DIAGNOSIS — R609 Edema, unspecified: Secondary | ICD-10-CM

## 2016-09-16 DIAGNOSIS — R600 Localized edema: Secondary | ICD-10-CM | POA: Diagnosis not present
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
## 2016-09-16 NOTE — ED Triage Notes (Addendum)
Pt here for BLE swelling and pain with tightness.  . sts no injury to ankle. sts this happens from time to time and the swelling goes down.

## 2016-09-16 NOTE — Discharge Instructions (Signed)
Please go to Er for further evaluation of bilateral peripheral edema and assistance with stopping alcohol abuse/use.

## 2016-11-22 ENCOUNTER — Emergency Department (HOSPITAL_COMMUNITY)
Admission: EM | Admit: 2016-11-22 | Discharge: 2016-11-22 | Discharge status: Against Medical Advice | Payer: Medicaid Other

## 2016-11-22 NOTE — ED Notes (Signed)
Pt continues to be unable to be found in ER waiting room

## 2016-11-22 NOTE — ED Notes (Signed)
Pt very upset at NF desk regarding any wait times, pt also irritated NS, NF or NT would not diagnose and treat ear issue  Pt seen immediately walking out ER doors after registration

## 2017-01-28 ENCOUNTER — Encounter (INDEPENDENT_AMBULATORY_CARE_PROVIDER_SITE_OTHER): Payer: Self-pay | Admitting: Physician Assistant

## 2017-01-28 ENCOUNTER — Ambulatory Visit (INDEPENDENT_AMBULATORY_CARE_PROVIDER_SITE_OTHER): Payer: Self-pay | Admitting: Physician Assistant

## 2017-01-28 VITALS — BP 128/69 | HR 104 | Temp 98.8°F | Wt 138.0 lb

## 2017-01-28 DIAGNOSIS — R600 Localized edema: Secondary | ICD-10-CM

## 2017-01-28 DIAGNOSIS — F1020 Alcohol dependence, uncomplicated: Secondary | ICD-10-CM

## 2017-01-28 DIAGNOSIS — Z1159 Encounter for screening for other viral diseases: Secondary | ICD-10-CM

## 2017-01-28 LAB — POCT URINALYSIS DIPSTICK
GLUCOSE UA: 100
Leukocytes, UA: NEGATIVE
Nitrite, UA: NEGATIVE
Protein, UA: NEGATIVE
RBC UA: NEGATIVE
SPEC GRAV UA: 1.025 (ref 1.010–1.025)
Urobilinogen, UA: >=8 E.U./dL — AB
pH, UA: 5.5 (ref 5.0–8.0)

## 2017-01-28 MED ORDER — LORAZEPAM 2 MG PO TABS: ORAL_TABLET | ORAL | 0 refills | Status: DC

## 2017-01-28 MED ORDER — FUROSEMIDE 40 MG PO TABS: 40.0000 mg | ORAL_TABLET | Freq: Every day | ORAL | 0 refills | Status: DC

## 2017-01-28 MED ORDER — ACAMPROSATE CALCIUM 333 MG PO TBEC
666.0000 mg | DELAYED_RELEASE_TABLET | Freq: Three times a day (TID) | ORAL | 0 refills | Status: AC
Start: 2017-01-28 — End: 2017-02-27

## 2017-01-28 MED ORDER — THIAMINE HCL 100 MG PO TABS: 100.0000 mg | ORAL_TABLET | Freq: Every day | ORAL | 1 refills | Status: DC

## 2017-01-28 NOTE — Progress Notes (Signed)
Subjective:  Patient ID: Theodore Brown, male    DOB: 1965-10-12  Age: 51 y.o. MRN: [id number]  CC: ankle swelling  HPI Theodore Brown is a 51 y.o. male with a medical history of alcohol abuse, alcoholic liver disease, marijuana use, ventral hernia, and anxiety presents with bilateral lower extremity edema x1-2 months ago. Has not taken anything for relief. Continues to drink alcohol regularly but wants to quit. Denies ascites, CP, palpitations, SOB, HA, confusion, tingling, numbness, weakness, fever, chills, nausea, vomiting, rash, or GI/GU sxs.    ROS Review of Systems  Constitutional: Negative for chills, fever and malaise/fatigue.  Eyes: Negative for blurred vision.  Respiratory: Negative for shortness of breath.   Cardiovascular: Positive for leg swelling. Negative for chest pain and palpitations.  Gastrointestinal: Negative for abdominal pain and nausea.  Genitourinary: Negative for dysuria and hematuria.  Musculoskeletal: Negative for joint pain and myalgias.  Skin: Negative for rash.  Neurological: Negative for tingling and headaches.  Psychiatric/Behavioral: Negative for depression. The patient is not nervous/anxious.     Objective:  Wt 138 lb (62.6 kg)   BMI 22.96 kg/m   BP/Weight 01/28/2017 09/16/2016 01/24/2016  Systolic BP - 129 148  Diastolic BP - 78 76  Wt. (Lbs) 138 - -  BMI 22.96 - -      Physical Exam  Constitutional: He is oriented to person, place, and time.  thin, NAD, polite  HENT:  Head: Normocephalic and atraumatic.  Eyes: No scleral icterus.  Neck: Normal range of motion. Neck supple. No thyromegaly present.  Cardiovascular: Normal rate, regular rhythm and normal heart sounds.   Pulmonary/Chest: Effort normal and breath sounds normal.  Abdominal: Soft. Bowel sounds are normal. There is no tenderness.  hepatomegaly  Musculoskeletal: He exhibits no edema.  1+ pitting edema bilaterally  Neurological: He is alert and oriented to person,  place, and time. No cranial nerve deficit. Coordination normal.  Skin: Skin is warm and dry. No rash noted. No erythema. No pallor.  Psychiatric: He has a normal mood and affect. His behavior is normal. Thought content normal.  Vitals reviewed.    Assessment & Plan:    1. Lower extremity edema - Comprehensive metabolic panel - CBC with Differential - TSH - Urinalysis Dipstick with urobilinogen >=8.0 - Begin furosemide (LASIX) 40 MG tablet; Take 1 tablet (40 mg total) by mouth daily.  Dispense: 14 tablet; Refill: 0 - US Abdomen Limited RUQ; Future  2. Alcoholism (HCC) - Comprehensive metabolic panel - CBC with Differential - TSH - Urinalysis Dipstick with urobilinogen >=8.0 - Begin acamprosate (CAMPRAL) 333 MG tablet; Take 2 tablets (666 mg total) by mouth 3 (three) times daily with meals.  Dispense: 180 tablet; Refill: 0 - Begin LORazepam (ATIVAN) 2 MG tablet; Take one tablet when symptoms of delerium tremens (DTs) are felt. You may take one every hour as long as symptoms of DTs are present.  Dispense: 45 tablet; Refill: 0 - Begin thiamine 100 MG tablet; Take 1 tablet (100 mg total) by mouth daily.  Dispense: 30 tablet; Refill: 1 - US Abdomen Limited RUQ; Future  3. Need for hepatitis C screening test - Hepatitis panel, acute   Meds ordered this encounter  Medications  . furosemide (LASIX) 40 MG tablet    Sig: Take 1 tablet (40 mg total) by mouth daily.    Dispense:  14 tablet    Refill:  0    Order Specific Question:   Supervising Provider    Answer:  JEGEDE, OLUGBEMIGA E [id number]  . acamprosate (CAMPRAL) 333 MG tablet    Sig: Take 2 tablets (666 mg total) by mouth 3 (three) times daily with meals.    Dispense:  180 tablet    Refill:  0    Order Specific Question:   Supervising Provider    Answer:   Quentin Angst [id number]  . LORazepam (ATIVAN) 2 MG tablet    Sig: Take one tablet when symptoms of delerium tremens (DTs) are felt. You may take one every hour as  long as symptoms of DTs are present.    Dispense:  45 tablet    Refill:  0    Order Specific Question:   Supervising Provider    Answer:   Quentin Angst [id number]  . thiamine 100 MG tablet    Sig: Take 1 tablet (100 mg total) by mouth daily.    Dispense:  30 tablet    Refill:  1    Order Specific Question:   Supervising Provider    Answer:   Quentin Angst [id number]    Follow-up: Return in about 2 weeks (around 02/11/2017).   Loletta Specter PA

## 2017-01-28 NOTE — Patient Instructions (Signed)
Delirium Tremens Delirium tremens, also known as DTs, is the most severe form of alcohol withdrawal. Alcohol withdrawal is a group of mental and physical symptoms that can develop when a person who drinks heavily and regularly stops drinking or drinks less than usual. Delirium tremens is a serious condition and can be life-threatening. It must be treated in a hospital or a treatment center. What are the causes? Heavy and regular drinking can cause chemicals that send signals from the brain to the body (neurotransmitters) to deactivate. Alcohol withdrawal, including DTs, develops when deactivated neurotransmitters reactivate because a person stops drinking or drinks less than usual. What increases the risk? The more a person drinks and the longer he or she drinks, the greater the risk of DTs. This condition is more likely to develop in:  People who have had severe alcohol withdrawal or DTs in the past.  People who have had a seizure during a previous episode of alcohol withdrawal.  Elderly people.  Pregnant women.  People who use illegal drugs.  People who take medicines to help them relax (sedatives).  People who have certain medical problems, including: ? Infection. ? Heart, lung, or liver disease. ? History of seizures. ? Mental health problems.  What are the signs or symptoms? Early symptoms of alcohol withdrawal happen before symptoms of DTs. These symptoms may start within 6 hours after the most recent drink, and they may last for 5-7 days. Early symptoms of alcohol withdrawal may include:  Difficulty sleeping.  Anxiety.  Difficulty thinking clearly.  Irritability.  Fast heart rate.  Fast breathing.  Headache.  Nausea and vomiting.  Fever.  Excessive sweating.  A heartbeat that feels irregular or faster than normal (palpitations).  Involuntary shaking or trembling (tremor) of a body part.  Symptoms of DTs may begin after early withdrawal symptoms. These  symptoms may start to develop 3 days after the most recent drink. They may last for up to 8 days. Symptoms of DTs may include:  Changes in attention and awareness.  Sudden changes in heart rate and rhythm, body temperature, and blood pressure (vital signs).  Feeling or seeing things that other people do not feel or see (hallucinations).  Extreme sleepiness (stupor).  Extreme confusion.  Seizures.  How is this diagnosed? This condition may be diagnosed based on:  Medical history.  History of alcohol consumption.  Symptoms.  Blood tests.  Urine tests.  A physical exam. The health care provider may check for: ? Fever. ? High blood pressure. ? Rapid heart rate. ? Overactive reflexes. ? Tremors.  How is this treated? Treatment for this condition is done in a hospital or treatment center. It may include:  Monitoring your vital signs.  Receiving fluids and minerals (electrolytes) through an IV tube.  Sedatives.  Medicines that reduce anxiety.  Medicines to control your blood pressure.  Medicines to prevent or control seizures.  Multivitamins and B vitamins.  Having ongoing counseling and mental health support if you have a history of alcohol abuse or if you used an illegal drug.  Follow these instructions at home:   Do not drink alcohol.  Take over-the-counter and prescription medicines only as told by your health care provider.  Do not drive or operate heavy machinery until your health care provider approves.  Drink enough fluid to keep your urine clear or pale yellow.  Have someone stay with you or be available to you in case you need help.  Consider joining a 12-step program or another alcohol support   group.  Keep all follow-up visits as told by your health care provider. This is important. Contact a health care provider if:  You have symptoms that get worse or do not go away.  You cannot eat or drink without vomiting.  You are struggling with  not drinking alcohol.  You cannot stop drinking alcohol. Get help right away if:  You have a seizure.  You have a fever along with other symptoms of alcohol withdrawal.  You become extremely confused.  You have hallucinations.  You become extremely sleepy.  You vomit blood.  You have extreme tremors or sweating.  You have palpitations.  You have chest pain.  You have difficulty breathing. These symptoms may represent a serious problem that is an emergency. Do not wait to see if the symptoms will go away. Get medical help right away. Call your local emergency services (911 in the [place] Do not drive yourself to the hospital. This information is not intended to replace advice given to you by your health care provider. Make sure you discuss any questions you have with your health care provider. Document Released: 04/21/2005 Document Revised: 09/27/2015 Document Reviewed: 10/11/2014 Elsevier Interactive Patient Education  2018 [employer]  

## 2017-01-29 LAB — COMPREHENSIVE METABOLIC PANEL
A/G RATIO: 0.6 — AB (ref 1.2–2.2)
ALBUMIN: 2.5 g/dL — AB (ref 3.5–5.5)
ALT: 22 IU/L (ref 0–44)
AST: 59 IU/L — ABNORMAL HIGH (ref 0–40)
Alkaline Phosphatase: 176 IU/L — ABNORMAL HIGH (ref 39–117)
BUN/Creatinine Ratio: 5 — ABNORMAL LOW (ref 9–20)
BUN: 4 mg/dL — ABNORMAL LOW (ref 6–24)
Bilirubin Total: 1.5 mg/dL — ABNORMAL HIGH (ref 0.0–1.2)
CO2: 22 mmol/L (ref 20–29)
CREATININE: 0.83 mg/dL (ref 0.76–1.27)
Calcium: 8.2 mg/dL — ABNORMAL LOW (ref 8.7–10.2)
Chloride: 105 mmol/L (ref 96–106)
GFR calc non Af Amer: 103 mL/min/{1.73_m2} (ref >59)
GFR, EST AFRICAN AMERICAN: 119 mL/min/{1.73_m2} (ref >59)
GLOBULIN, TOTAL: 4.2 g/dL (ref 1.5–4.5)
Glucose: 124 mg/dL — ABNORMAL HIGH (ref 65–99)
POTASSIUM: 3.7 mmol/L (ref 3.5–5.2)
SODIUM: 140 mmol/L (ref 134–144)
TOTAL PROTEIN: 6.7 g/dL (ref 6.0–8.5)

## 2017-01-29 LAB — TSH: TSH: 1.1 u[IU]/mL (ref 0.450–4.500)

## 2017-01-29 LAB — CBC WITH DIFFERENTIAL/PLATELET
BASOS: 0 %
Basophils Absolute: 0 10*3/uL (ref 0.0–0.2)
EOS (ABSOLUTE): 0.1 10*3/uL (ref 0.0–0.4)
EOS: 2 %
HEMATOCRIT: 24.5 % — AB (ref 37.5–51.0)
HEMOGLOBIN: 7.3 g/dL — AB (ref 13.0–17.7)
IMMATURE GRANS (ABS): 0 10*3/uL (ref 0.0–0.1)
IMMATURE GRANULOCYTES: 0 %
Lymphocytes Absolute: 1.6 10*3/uL (ref 0.7–3.1)
Lymphs: 38 %
MCH: 25.8 pg — ABNORMAL LOW (ref 26.6–33.0)
MCHC: 29.8 g/dL — AB (ref 31.5–35.7)
MCV: 87 fL (ref 79–97)
MONOS ABS: 0.6 10*3/uL (ref 0.1–0.9)
Monocytes: 15 %
NEUTROS ABS: 1.8 10*3/uL (ref 1.4–7.0)
Neutrophils: 45 %
Platelets: 89 10*3/uL — CL (ref 150–379)
RBC: 2.83 x10E6/uL — ABNORMAL LOW (ref 4.14–5.80)
RDW: 18.6 % — AB (ref 12.3–15.4)
WBC: 4.1 10*3/uL (ref 3.4–10.8)

## 2017-01-29 LAB — HEPATITIS PANEL, ACUTE
HEP B C IGM: NEGATIVE
HEP B S AG: NEGATIVE
Hep A IgM: NEGATIVE
Hep C Virus Ab: <0.1 s/co ratio (ref 0.0–0.9)

## 2017-01-30 ENCOUNTER — Other Ambulatory Visit (INDEPENDENT_AMBULATORY_CARE_PROVIDER_SITE_OTHER): Payer: Self-pay | Admitting: Physician Assistant

## 2017-01-30 ENCOUNTER — Ambulatory Visit (HOSPITAL_COMMUNITY): Payer: Self-pay

## 2017-01-30 ENCOUNTER — Telehealth (INDEPENDENT_AMBULATORY_CARE_PROVIDER_SITE_OTHER): Payer: Self-pay

## 2017-01-30 DIAGNOSIS — D538 Other specified nutritional anemias: Secondary | ICD-10-CM

## 2017-01-30 MED ORDER — FERROUS SULFATE 325 (65 FE) MG PO TABS: 325.0000 mg | ORAL_TABLET | Freq: Two times a day (BID) | ORAL | 1 refills | Status: DC

## 2017-01-30 MED ORDER — FOLIC ACID 5 MG PO CAPS: 1.0000 | ORAL_CAPSULE | Freq: Every day | ORAL | 1 refills | Status: DC

## 2017-01-30 MED ORDER — VITAMIN B-12 250 MCG PO TABS: 250.0000 ug | ORAL_TABLET | Freq: Every day | ORAL | 1 refills | Status: DC

## 2017-01-30 NOTE — Telephone Encounter (Signed)
Patient voicemail box not yet set up. Will attempt to contact patient again, if unsuccessful will mail result. Maryjean Morn, CMA

## 2017-01-30 NOTE — Telephone Encounter (Signed)
-----   Message from Loletta Specter, PA-C sent at 01/30/2017  8:57 AM EDT ----- Pt is highly anemic and plts are very low. He is almost at the level of needing a blood transfusion. Liver is showing signs of inflammation. Alcohol is likely causing all of this. Hep A, B, and C are negative. I will send out vitamin B12, folic acid, and iron to help with his anemia. Pharmacy [facility] [address]. He needs to return in 2 weeks.

## 2017-02-02 ENCOUNTER — Ambulatory Visit (HOSPITAL_COMMUNITY): Payer: Self-pay

## 2017-02-03 ENCOUNTER — Telehealth (INDEPENDENT_AMBULATORY_CARE_PROVIDER_SITE_OTHER): Payer: Self-pay

## 2017-02-03 ENCOUNTER — Encounter (INDEPENDENT_AMBULATORY_CARE_PROVIDER_SITE_OTHER): Payer: Self-pay

## 2017-02-03 ENCOUNTER — Telehealth: Payer: Self-pay

## 2017-02-03 NOTE — Telephone Encounter (Signed)
-----   Message from Loletta Specter, PA-C sent at 01/30/2017  8:57 AM EDT ----- Pt is highly anemic and plts are very low. He is almost at the level of needing a blood transfusion. Liver is showing signs of inflammation. Alcohol is likely causing all of this. Hep A, B, and C are negative. I will send out vitamin B12, folic acid, and iron to help with his anemia. Pharmacy [facility] [address]. He needs to return in 2 weeks.

## 2017-02-03 NOTE — Telephone Encounter (Signed)
Patient aware of all results and prescriptions. Mailed results to patient as well. Maryjean Morn, CMA

## 2017-02-03 NOTE — Telephone Encounter (Signed)
Pt's mother called and I informed her of sons lab results.

## 2017-02-04 ENCOUNTER — Ambulatory Visit (HOSPITAL_COMMUNITY)
Admission: RE | Admit: 2017-02-04 | Discharge: 2017-02-04 | Disposition: A | Discharge status: Home / Self Care | Payer: Medicaid Other | Source: Ambulatory Visit | Attending: Physician Assistant | Admitting: Physician Assistant

## 2017-02-04 DIAGNOSIS — R600 Localized edema: Secondary | ICD-10-CM | POA: Diagnosis not present

## 2017-02-04 DIAGNOSIS — F1020 Alcohol dependence, uncomplicated: Secondary | ICD-10-CM

## 2017-02-06 ENCOUNTER — Telehealth (INDEPENDENT_AMBULATORY_CARE_PROVIDER_SITE_OTHER): Payer: Self-pay

## 2017-02-06 NOTE — Telephone Encounter (Signed)
-----   Message from Loletta Specter, PA-C sent at 02/06/2017  8:53 AM EDT ----- Cirrhosis but stable when compared to previous exam. Keep appointments with me. Follow advise given to him in clinic.

## 2017-02-06 NOTE — Telephone Encounter (Signed)
Patient has a voicemail that is not set up yet, will try calling once more. Theodore Brown, CMA

## 2017-02-09 ENCOUNTER — Encounter (INDEPENDENT_AMBULATORY_CARE_PROVIDER_SITE_OTHER): Payer: Self-pay

## 2017-02-09 ENCOUNTER — Telehealth (INDEPENDENT_AMBULATORY_CARE_PROVIDER_SITE_OTHER): Payer: Self-pay

## 2017-02-09 NOTE — Telephone Encounter (Signed)
-----   Message from Roger David Gomez, PA-C sent at 02/06/2017  8:53 AM EDT ----- Cirrhosis but stable when compared to previous exam. Keep appointments with me. Follow advise given to him in clinic. 

## 2017-02-09 NOTE — Telephone Encounter (Signed)
Unable to leave message as voicemail has not been set up yet. Will mail results. Maryjean Morn, CMA

## 2017-02-11 ENCOUNTER — Encounter (INDEPENDENT_AMBULATORY_CARE_PROVIDER_SITE_OTHER): Payer: Self-pay | Admitting: Physician Assistant

## 2017-02-11 ENCOUNTER — Ambulatory Visit (INDEPENDENT_AMBULATORY_CARE_PROVIDER_SITE_OTHER): Payer: Self-pay | Admitting: Physician Assistant

## 2017-02-11 VITALS — BP 137/83 | HR 86 | Temp 97.9°F | Wt 146.0 lb

## 2017-02-11 DIAGNOSIS — R600 Localized edema: Secondary | ICD-10-CM

## 2017-02-11 DIAGNOSIS — R202 Paresthesia of skin: Secondary | ICD-10-CM

## 2017-02-11 DIAGNOSIS — Z23 Encounter for immunization: Secondary | ICD-10-CM

## 2017-02-11 DIAGNOSIS — F1020 Alcohol dependence, uncomplicated: Secondary | ICD-10-CM

## 2017-02-11 MED ORDER — FUROSEMIDE 40 MG PO TABS: 40.0000 mg | ORAL_TABLET | Freq: Every day | ORAL | 0 refills | Status: DC

## 2017-02-11 MED ORDER — GABAPENTIN 300 MG PO CAPS: 300.0000 mg | ORAL_CAPSULE | Freq: Two times a day (BID) | ORAL | 3 refills | Status: DC

## 2017-02-11 NOTE — Progress Notes (Signed)
Pt complains that his ankles and feet have a stinging sensation

## 2017-02-11 NOTE — Patient Instructions (Addendum)
[facility] [address]   Alcohol Intoxication Alcohol intoxication occurs when a person no longer thinks clearly or functions well (becomes impaired) after drinking alcohol. Intoxication can occur with even one drink. The level of impairment depends on:  The amount of alcohol the person had.  The person's age, gender, and weight.  How often the person drinks.  Whether the person has other medical conditions, such as diabetes, seizures, or a heart condition.  Alcohol intoxication can range in severity from mild to severe. The condition can be dangerous, especially when caused by drinking large amounts of alcohol in a short period of time (binge drinking) or if the person also took certain prescription medicines or recreational drugs. What are the signs or symptoms? Symptoms of mild alcohol intoxication include:  Feeling relaxed or sleepy.  Mild difficulty with: ? Coordination. ? Speech. ? Memory. ? Attention.  Symptoms of moderate alcohol intoxication include:  Extreme emotions, such as anger or sadness.  Moderate difficulty with: ? Coordination. ? Speech. ? Memory. ? Attention.  Symptoms of severe alcohol intoxication include:  Passing out.  Vomiting.  Confusion.  Slow breathing.  Coma.  Severe difficulty with: ? Coordination. ? Speech. ? Memory. ? Attention.  Intoxication, especially in people who are not exposed to alcohol often can progress from mild to severe quickly, and may even cause coma or death. How is this diagnosed? This condition may be diagnosed based on:  A medical history.  A physical exam.  A blood test that measures the concentration of alcohol in the blood (blood alcohol content, or BAC).  Whether there is a smell of alcohol on the breath.  Your health care provider will ask you how much alcohol you drank and what kind of alcohol you had. How is this treated? Usually, treatment is not needed for  this condition. Most of the effects of alcohol are temporary and go away as the alcohol naturally leaves the body. Your health care provider may recommend monitoring until the alcohol level starts to drop and it is safe to go home. You may also get fluids through an IV tube to help prevent dehydration. If the intoxication is severe, a breathing machine called a ventilator may be needed to support your breathing. Follow these instructions at home:  Do not drive after drinking alcohol.  Have someone stay with you while you are intoxicated. You should not be left alone.  Stay hydrated. Drink enough fluid to keep your urine clear or pale yellow.  Avoid caffeine because it can dehydrate you.  Take over-the-counter and prescription medicines only as told by your health care provider. How is this prevented? To prevent alcohol intoxication:  Limit alcohol intake to no more than 1 drink a day for nonpregnant women and 2 drinks a day for men. One drink equals 12 oz of beer, 5 oz of wine, or 1 oz of hard liquor.  Do not drink alcohol on an empty stomach.  Avoid drinking alcohol if: ? You are under the legal drinking age. ? You are pregnant or may be pregnant. ? You are taking medicines that should not be taken with alcohol. ? Your drinking causes your medical condition to get worse. ? You need to drive or perform activities that require your attention. ? You have substance use disorder.  To prevent potentially serious complications of alcohol intoxication, seek immediate medical care if you or someone you know has signs of moderate or severe alcohol intoxication. These include:  Moderate or severe difficulty with: ? Coordination. ? Speech. ? Memory. ? Attention.  Passing out.  Confusion.  Vomiting.  Do not leave someone alone if he or she is intoxicated. Contact a health care provider if:  You do not feel better after a few days.  You are having problems at work, at school, or at  home due to drinking. Get help right away if:  You become shaky when you try to stop drinking.  You shake uncontrollably (have a seizure).  You vomit blood. Blood in vomit may look bright red, or it may look like coffee grounds.  You have blood in your stool. Blood in stool may be bright red, or it may make stool appear black and tarry and make it smell bad.  You become light-headed or you faint. If you ever feel like you may hurt yourself or others, or have thoughts about taking your own life, get help right away. You can go to your nearest emergency department or call:  Your local emergency services (911 in the [place]  A suicide crisis helpline, such as the National Suicide Prevention Lifeline at 806-684-3193. This is open 24 hours a day.  This information is not intended to replace advice given to you by your health care provider. Make sure you discuss any questions you have with your health care provider. Document Released: 01/29/2005 Document Revised: 01/05/2016 Document Reviewed: 01/05/2016 Elsevier Interactive Patient Education  [employer].

## 2017-02-11 NOTE — Progress Notes (Signed)
   Subjective:  Patient ID: Theodore Brown, male    DOB: [birth year]  Age: 51 y.o. MRN: [id number]  CC: f/u LE edema and alcoholism  HPI  Theodore Brown is a 51 y.o. male with a medical history of alcohol abuse, alcoholic liver disease, marijuana use, ventral hernia, and anxiety presents to f/u on LE edema and alcoholism. Was prescribed Lasix 40 mg qday. Says LE edema is now much better.     In regards to alcoholism, he was prescribed acamprosate 666 mg TID, Lorazepam 2 mg q1 hr prn, thiamine 100 mg qday.  Has not taken acamprosate, Lorazepam, or thiamine due to price at [facility]. They have not tried to go to [facility] pharmacy      Outpatient Medications Prior to Visit  Medication Sig Dispense Refill  . acamprosate (CAMPRAL) 333 MG tablet Take 2 tablets (666 mg total) by mouth 3 (three) times daily with meals. 180 tablet 0  . ferrous sulfate 325 (65 FE) MG tablet Take 1 tablet (325 mg total) by mouth 2 (two) times daily with a meal. 60 tablet 1  . Folic Acid 5 MG CAPS Take 1 capsule (5 mg total) by mouth daily. 30 capsule 1  . furosemide (LASIX) 40 MG tablet Take 1 tablet (40 mg total) by mouth daily. 14 tablet 0  . LORazepam (ATIVAN) 2 MG tablet Take one tablet when symptoms of delerium tremens (DTs) are felt. You may take one every hour as long as symptoms of DTs are present. 45 tablet 0  . thiamine 100 MG tablet Take 1 tablet (100 mg total) by mouth daily. 30 tablet 1  . vitamin B-12 (CYANOCOBALAMIN) 250 MCG tablet Take 1 tablet (250 mcg total) by mouth daily. 30 tablet 1   No facility-administered medications prior to visit.      ROS ROS  Objective:  Wt 146 lb (66.2 kg)   BMI 24.30 kg/m   BP/Weight 02/11/2017 01/28/2017 09/16/2016  Systolic BP - 128 129  Diastolic BP - 69 78  Wt. (Lbs) 146 138 -  BMI 24.3 22.96 -      Physical Exam   Assessment & Plan:   1. Alcoholism (HCC)  2. Paresthesia - gabapentin (NEURONTIN) 300 MG capsule; Take 1 capsule (300 mg total)  by mouth 2 (two) times daily.  Dispense: 60 capsule; Refill: 3  3. Lower extremity edema - furosemide (LASIX) 40 MG tablet; Take 1 tablet (40 mg total) by mouth daily.  Dispense: 14 tablet; Refill: 0  4. Need for prophylactic vaccination and inoculation against influenza - Flu Vaccine QUAD 6+ mos PF IM (Fluarix Quad PF)   Meds ordered this encounter  Medications  . gabapentin (NEURONTIN) 300 MG capsule    Sig: Take 1 capsule (300 mg total) by mouth 2 (two) times daily.    Dispense:  60 capsule    Refill:  3    Order Specific Question:   Supervising Provider    Answer:   Quentin Angst [id number]  . furosemide (LASIX) 40 MG tablet    Sig: Take 1 tablet (40 mg total) by mouth daily.    Dispense:  14 tablet    Refill:  0    Order Specific Question:   Supervising Provider    Answer:   Quentin Angst [id number]    Follow-up: Return in about 4 weeks (around 03/11/2017).   Loletta Specter PA

## 2017-03-11 ENCOUNTER — Encounter (INDEPENDENT_AMBULATORY_CARE_PROVIDER_SITE_OTHER): Payer: Self-pay | Admitting: Physician Assistant

## 2017-03-11 ENCOUNTER — Ambulatory Visit (INDEPENDENT_AMBULATORY_CARE_PROVIDER_SITE_OTHER): Payer: Medicaid Other | Admitting: Physician Assistant

## 2017-03-11 VITALS — BP 136/66 | HR 100 | Temp 97.9°F | Wt 144.8 lb

## 2017-03-11 DIAGNOSIS — R600 Localized edema: Secondary | ICD-10-CM | POA: Diagnosis not present

## 2017-03-11 DIAGNOSIS — F1020 Alcohol dependence, uncomplicated: Secondary | ICD-10-CM | POA: Diagnosis not present

## 2017-03-11 MED ORDER — FUROSEMIDE 40 MG PO TABS: 40.0000 mg | ORAL_TABLET | Freq: Every day | ORAL | 0 refills | Status: DC

## 2017-03-11 MED ORDER — LORAZEPAM 2 MG PO TABS: ORAL_TABLET | ORAL | 0 refills | Status: DC

## 2017-03-11 MED ORDER — THIAMINE HCL 100 MG PO TABS: 100.0000 mg | ORAL_TABLET | Freq: Every day | ORAL | 1 refills | Status: DC

## 2017-03-11 MED ORDER — NALTREXONE HCL 50 MG PO TABS: 50.0000 mg | ORAL_TABLET | Freq: Every day | ORAL | 0 refills | Status: DC

## 2017-03-11 NOTE — Patient Instructions (Signed)
Community Resources  Advocacy/Legal Legal Aid [place]:  1-866-219-5262  /  336-272-0148  Family Justice Center:  336-641-7233  [facility] 24-hr Crisis line:  336-273-7273  Women's Resource Center, [place]:  336-275-6090  Court Watch (custody):  336-275-2346  [place] [facility]:   336-279-9299    Baby & Breastfeeding Car Seat Inspection @ Various [place] Fire Depts.- call 336-373-2177  [facility]  336-832-6860  [facility] 336-878-6712  WIC: 336-641-3663 ([place]);  336-641-7571 ([place])  La Leche League:  1-877-452-5321   Childcare [place] [facility]: 336-369-5097 ([place]) / 336-887-8224 ([place])  - Child Care Resources/ Referrals/ Scholarships  - Head Start/ Early Head Start (call or apply online)  [place] DHHS: [place] Pre-K :  1-800-859-0829 / 336-274-5437   Employment / Job Search [facility] [place]: 336-275-6090 / [address]  [place] [employer] (JobLink): 336-373-5922 ([place]) / 336-882-4141 ([place])  [facility] Community Resource/ Career Center: 336-275-9801 / 336-282-7307  [facility] Job & Career Center: 336-373-3764  DHHS Work First: 336-641-3447 ([place]) / 336-641-3447 ([place])  [facility] [place]:  336-676-5871   Financial Assistance [facility]:  336-553-2657  [employer]: 336-235-0368  [employer] (furniture):  336-370-4002  [place] [facility]: 336-373-4264  Low Income Energy Assistance  336-641-3000   Food Assistance DHHS- SNAP/ Food Stamps: 336-641-4588  WIC: [place]- 336-641-3663 ;  [place] 336-641-7571  Little Green Book- Free Meals  Little Blue Book- Free Food Pantries  During the summer, text "FOOD" to 877877   General Health / Clinics (Adults) Orange Card (for Adults) through [facility]: (336) 895-4900  [facility]:   336-832-8035  [facility]:   336-832-4444  Health Department:  336-641-3245  [facility]:  336-415-3877 / 336-641-2100  Planned [facility] of [place]:   336-373-0678  [facility]:   336-334-4822 x 50251   Housing [place] Housing Coalition:   336-691-9521  [place] Housing Authority:  336-275-8501  Affordable Housing Managemnt:  336-273-0568   Immigrant/ Refugee Center for [place]ians (UNCG):  336-256-1065  Faith [facility]:  336-379-0037  [facility]riv[employer] [facility]:  336-937-4701  Church World Services:  336-617-0381  African Services Coalition:  336-574-2677   LGBTQ YouthSAFE  www.youthsafe[place].org  PFLAG  336-541-6754 / info@[place].org  The Trevor Project:  1-866-488-7386   Mental Health/ Substance Use [facility]  336-387-6161  [facility]:  336-832-9700 or 1-800-711-2635  [facility] Circle of Care:  336-271-5888  [facility]:  336-294-1349  [facility]:  336-542-2884  [facility] (walk-ins)  336-676-6840 / [address]  [place]:  800-449-1287  Alcoholics Anonymous:  336-854-4278  Narcotics Anonymous:  800-365-1036  Quit Smoking Hotline:  800-QUIT-NOW (800-784-8669)   Parenting Children's Home Society:  800-632-1400  [place]: Education Center & Support Groups:  336-832-6682  YWCA: 336-273-3461  UNCG: Bringing Out the Best:  336-334-3120               Thriving at Three (Hispanic families): 336-256-1066  Healthy Start ([facility]):  336-387-6161 x2288  Parents as Teachers:  336-691-0024  [place] [facility]- Learning Together (Immigrants): 336-369-5001   Poison Control 800-222-1222  Sports & Recreation [employer] Open Doors Application: ymcanwnc.org/join/open-doors-financial-assistance/  City of [place] Recreation Centers: http://www.[place].gov/index.aspx?page=3615   Special Needs Family Support Network:  336-832-6507  Autism Society of :   336-333-0197 x1402 or x1412 /  800-785-1035  [facility] [place]:  336-334-5773     ARC of [place]:  737-665-6354671-627-6690  Children's Developmental Service Agency (CDSA):  814-834-8105639 871 8363  [place] (Care Coordination for Children):  (802) 719-7585(701)180-8164   Transportation Medicaid Transportation: 954 704 [phone] to apply  [place] Transit Authority: 5618475363([phone] (reduced-fare bus ID to Medicaid/ Medicare/ Orange Card)  SCAT Paratransit services: Eligible riders only, call [phone] for application   Tutoring/Mentoring Black Child Development Institute: (920)586-1177  [facility] Brothers/ Big Sisters: [phone]) 437-9624 Manley Mason([place])  ([phone] ([place])  ACES through child's school: 928-151-8898  [employer] Achievers: contact your local Y  SHIELD Mentor Program: ([phone] 003-7678605 [phone]     Alcoholic Liver Disease Alcoholic liver disease happens when the liver does not work the way it should. The condition is caused by drinking too much alcohol for many years. Follow these instructions at home:  Do not drink alcohol.  Take medicines only as told by your doctor.  Take vitamins only as told by your doctor.  Follow any diet instructions that your doctor gave you. You may need to: ? Eat foods that have thiamine. These include whole-wheat cereals, pork, and raw vegetables. ? Eat foods that have folic acid. These include vegetables, fruits, meats, beans, nuts, and dairy foods. ? Eat foods that are high in carbohydrates. These include yogurt, beans, potatoes, and rice. Contact a doctor if:  You have a fever.  You are short of breath.  You have trouble breathing.  You have bright red blood in your poop (stool).  Your poop looks like tar.  You throw up (vomit) blood.  Your skin looks more yellow, pale, or dark.  You get headaches.  You have trouble thinking.  You have trouble balancing or walking. This information is not intended to replace advice given to you by your health care provider. Make sure you discuss any questions you have with your health care provider. Document Released: 02/16/2009 Document Revised:  09/27/2015 Document Reviewed: 03/23/2014 Elsevier Interactive Patient Education  [employer]2018 Elsevier Inc.

## 2017-03-11 NOTE — Progress Notes (Signed)
Subjective:  Patient ID: Theodore Brown, male    DOB: 09/01/1965  Age: 51 y.o. MRN: [id number]  CC: F/u alcoholism  HPI  Theodore N Thompsonis a 51 y.o.malewith a medical history of alcohol abuse, alcoholic liver disease, marijuana use, ventral hernia, and anxiety presents to f/u on LE edema and alcoholism. Says his LE edema is much better but feels his edema is returning.     Says he has taken Lorazepam, Thiamine, and Acamprosate but has since run out. Did not have DTs, headache, speech impediment.  Feels his drinking is perhaps 50% reduced. Last drink this morning. Feels he is making strides in his drinking and is optimistic that he can stop eventually.     Outpatient Medications Prior to Visit  Medication Sig Dispense Refill  . ferrous sulfate 325 (65 FE) MG tablet Take 1 tablet (325 mg total) by mouth 2 (two) times daily with a meal. 60 tablet 1  . Folic Acid 5 MG CAPS Take 1 capsule (5 mg total) by mouth daily. 30 capsule 1  . furosemide (LASIX) 40 MG tablet Take 1 tablet (40 mg total) by mouth daily. 14 tablet 0  . LORazepam (ATIVAN) 2 MG tablet Take one tablet when symptoms of delerium tremens (DTs) are felt. You may take one every hour as long as symptoms of DTs are present. 45 tablet 0  . thiamine 100 MG tablet Take 1 tablet (100 mg total) by mouth daily. 30 tablet 1  . vitamin B-12 (CYANOCOBALAMIN) 250 MCG tablet Take 1 tablet (250 mcg total) by mouth daily. 30 tablet 1  . gabapentin (NEURONTIN) 300 MG capsule Take 1 capsule (300 mg total) by mouth 2 (two) times daily. (Patient not taking: Reported on 03/11/2017) 60 capsule 3   No facility-administered medications prior to visit.      ROS Review of Systems  Constitutional: Negative for chills, fever and malaise/fatigue.  Eyes: Negative for blurred vision.  Respiratory: Negative for shortness of breath.   Cardiovascular: Positive for leg swelling. Negative for chest pain and palpitations.  Gastrointestinal: Negative  for abdominal pain and nausea.  Genitourinary: Negative for dysuria and hematuria.  Musculoskeletal: Negative for joint pain and myalgias.  Skin: Negative for rash.  Neurological: Negative for tingling and headaches.  Psychiatric/Behavioral: Negative for depression. The patient is not nervous/anxious.     Objective:  BP 136/66 (BP Location: Left Arm, Patient Position: Sitting, Cuff Size: Normal)   Pulse 100   Temp 97.9 F (36.6 C) (Oral)   Wt 144 lb 12.8 oz (65.7 kg)   SpO2 96%   BMI 24.10 kg/m   BP/Weight 03/11/2017 02/11/2017 01/28/2017  Systolic BP 136 137 128  Diastolic BP 66 83 69  Wt. (Lbs) 144.8 146 138  BMI 24.1 24.3 22.96      Physical Exam  Constitutional: He is oriented to person, place, and time.  Thin, NAD, polite  HENT:  Head: Normocephalic and atraumatic.  Eyes: No scleral icterus.  Neck: Normal range of motion. Neck supple. No thyromegaly present.  Cardiovascular: Normal rate, regular rhythm and normal heart sounds.  Pulmonary/Chest: Effort normal and breath sounds normal. No respiratory distress. He has no wheezes.  Abdominal: Soft. Bowel sounds are normal. There is no tenderness.  Musculoskeletal: He exhibits no edema.  Neurological: He is alert and oriented to person, place, and time. No cranial nerve deficit. Coordination normal.  Skin: Skin is warm and dry. No rash noted. No erythema. No pallor.  Psychiatric: He has a normal mood  and affect. His behavior is normal. Thought content normal.  Vitals reviewed.    Assessment & Plan:    1. Alcoholism (HCC) - Refill LORazepam (ATIVAN) 2 MG tablet; Take one tablet when symptoms of delerium tremens (DTs) are felt. You may take one every hour as long as symptoms of DTs are present. Do not take more than 10 mg per day (five pills)  Dispense: 25 tablet; Refill: 0 - Refill thiamine 100 MG tablet; Take 1 tablet (100 mg total) daily by mouth.  Dispense: 30 tablet; Refill: 1 -Begin Naltrexone 50 mg qday -  Comprehensive metabolic panel to be drawn at next visit.  2. Bilateral lower extremity edema - Comprehensive metabolic panel to be drawn at next visit.  3. Lower extremity edema - Refill furosemide (LASIX) 40 MG tablet; Take 1 tablet (40 mg total) daily by mouth.  Dispense: 30 tablet; Refill: 0   Meds ordered this encounter  Medications  . naltrexone (DEPADE) 50 MG tablet    Sig: Take 1 tablet (50 mg total) daily by mouth.    Dispense:  30 tablet    Refill:  0    Order Specific Question:   Supervising Provider    Answer:   Quentin AngstJEGEDE, OLUGBEMIGA E [id number]]  . LORazepam (ATIVAN) 2 MG tablet    Sig: Take one tablet when symptoms of delerium tremens (DTs) are felt. You may take one every hour as long as symptoms of DTs are present. Do not take more than 10 mg per day (five pills)    Dispense:  25 tablet    Refill:  0    Order Specific Question:   Supervising Provider    Answer:   Quentin AngstJEGEDE, OLUGBEMIGA E [id number]]  . thiamine 100 MG tablet    Sig: Take 1 tablet (100 mg total) daily by mouth.    Dispense:  30 tablet    Refill:  1    Order Specific Question:   Supervising Provider    Answer:   Quentin AngstJEGEDE, OLUGBEMIGA E [id number]]  . furosemide (LASIX) 40 MG tablet    Sig: Take 1 tablet (40 mg total) daily by mouth.    Dispense:  30 tablet    Refill:  0    Order Specific Question:   Supervising Provider    Answer:   Quentin AngstJEGEDE, OLUGBEMIGA E [id number]]    Follow-up: Return in about 4 weeks (around 04/08/2017) for alcoholism.   Loletta Specteroger David Shanaye Rief PA

## 2017-03-29 ENCOUNTER — Encounter (HOSPITAL_COMMUNITY): Payer: Self-pay | Admitting: *Deleted

## 2017-03-29 ENCOUNTER — Other Ambulatory Visit: Payer: Self-pay

## 2017-03-29 ENCOUNTER — Emergency Department (HOSPITAL_COMMUNITY)
Admission: EM | Admit: 2017-03-29 | Discharge: 2017-03-29 | Disposition: A | Discharge status: Home / Self Care | Payer: Medicaid Other | Attending: Emergency Medicine | Admitting: Emergency Medicine

## 2017-03-29 DIAGNOSIS — M79604 Pain in right leg: Secondary | ICD-10-CM

## 2017-03-29 DIAGNOSIS — M79669 Pain in unspecified lower leg: Secondary | ICD-10-CM | POA: Diagnosis present

## 2017-03-29 DIAGNOSIS — R609 Edema, unspecified: Secondary | ICD-10-CM | POA: Diagnosis not present

## 2017-03-29 DIAGNOSIS — M79661 Pain in right lower leg: Secondary | ICD-10-CM | POA: Diagnosis not present

## 2017-03-29 DIAGNOSIS — M79662 Pain in left lower leg: Secondary | ICD-10-CM | POA: Insufficient documentation

## 2017-03-29 DIAGNOSIS — Z79899 Other long term (current) drug therapy: Secondary | ICD-10-CM | POA: Insufficient documentation

## 2017-03-29 DIAGNOSIS — M79605 Pain in left leg: Secondary | ICD-10-CM

## 2017-03-29 LAB — I-STAT CHEM 8, ED
BUN: 4 mg/dL — ABNORMAL LOW (ref 6–20)
CREATININE: 1 mg/dL (ref 0.61–1.24)
Calcium, Ion: 1.12 mmol/L — ABNORMAL LOW (ref 1.15–1.40)
Chloride: 106 mmol/L (ref 101–111)
GLUCOSE: 79 mg/dL (ref 65–99)
HEMATOCRIT: 34 % — AB (ref 39.0–52.0)
HEMOGLOBIN: 11.6 g/dL — AB (ref 13.0–17.0)
Potassium: 3.7 mmol/L (ref 3.5–5.1)
Sodium: 142 mmol/L (ref 135–145)
TCO2: 25 mmol/L (ref 22–32)

## 2017-03-29 NOTE — ED Triage Notes (Addendum)
PT states bil leg pain here via PTAR. Pt does not want visitor to know this, but he thinks the pain is from scratching from bed bug bites.  Calves are swollen.  No bed bugs noted in triage.
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
## 2017-03-29 NOTE — ED Notes (Signed)
Patient given something to eat and drink per edp approval  

## 2017-03-29 NOTE — Discharge Instructions (Signed)
Take the medication, but your doctor prescribed for your leg swelling and pain.  For the sores on the legs, soak in warm water 2 or 3 times a day and clean them well with soap at least once a day.  For the swelling try to elevate your feet above your heart as much as possible during the day.

## 2017-04-08 ENCOUNTER — Ambulatory Visit (INDEPENDENT_AMBULATORY_CARE_PROVIDER_SITE_OTHER): Payer: Self-pay | Admitting: Physician Assistant

## 2017-05-21 ENCOUNTER — Ambulatory Visit (INDEPENDENT_AMBULATORY_CARE_PROVIDER_SITE_OTHER): Payer: Self-pay | Admitting: Physician Assistant

## 2017-05-28 ENCOUNTER — Telehealth (INDEPENDENT_AMBULATORY_CARE_PROVIDER_SITE_OTHER): Payer: Self-pay | Admitting: Physician Assistant

## 2017-05-28 NOTE — Telephone Encounter (Signed)
Pt mom came to with the name of the medication her son need furosemide (LASIX) 40 MG tablet [facility] [address]

## 2017-05-28 NOTE — Telephone Encounter (Signed)
FWD to PCP. Tempestt S Roberts, CMA  

## 2017-05-28 NOTE — Telephone Encounter (Signed)
Pt mother came to the office to request a refill for his son Flue pills that he need please sent it to [facility] on [address]

## 2017-05-29 ENCOUNTER — Other Ambulatory Visit (INDEPENDENT_AMBULATORY_CARE_PROVIDER_SITE_OTHER): Payer: Self-pay | Admitting: Physician Assistant

## 2017-05-29 ENCOUNTER — Ambulatory Visit (HOSPITAL_COMMUNITY)
Admission: EM | Admit: 2017-05-29 | Discharge: 2017-05-29 | Disposition: A | Discharge status: Home / Self Care | Payer: Medicaid Other | Attending: Family Medicine | Admitting: Family Medicine

## 2017-05-29 ENCOUNTER — Encounter (HOSPITAL_COMMUNITY): Payer: Self-pay | Admitting: Emergency Medicine

## 2017-05-29 DIAGNOSIS — Z760 Encounter for issue of repeat prescription: Secondary | ICD-10-CM

## 2017-05-29 DIAGNOSIS — R600 Localized edema: Secondary | ICD-10-CM

## 2017-05-29 DIAGNOSIS — S8011XA Contusion of right lower leg, initial encounter: Secondary | ICD-10-CM | POA: Diagnosis not present

## 2017-05-29 MED ORDER — FUROSEMIDE 40 MG PO TABS: 40.0000 mg | ORAL_TABLET | Freq: Every day | ORAL | 2 refills | Status: DC

## 2017-05-29 MED ORDER — DICLOFENAC SODIUM 3 % EX CREA: 1.0000 "application " | TOPICAL_CREAM | Freq: Three times a day (TID) | CUTANEOUS | 1 refills | Status: DC
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
## 2017-05-29 NOTE — Telephone Encounter (Signed)
Medication sent to [facility].
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
## 2017-06-01 ENCOUNTER — Ambulatory Visit (INDEPENDENT_AMBULATORY_CARE_PROVIDER_SITE_OTHER): Payer: Self-pay | Admitting: Physician Assistant

## 2017-07-13 ENCOUNTER — Other Ambulatory Visit: Payer: Self-pay

## 2017-07-13 ENCOUNTER — Ambulatory Visit (INDEPENDENT_AMBULATORY_CARE_PROVIDER_SITE_OTHER): Payer: Medicaid Other | Admitting: Physician Assistant

## 2017-07-13 ENCOUNTER — Encounter (INDEPENDENT_AMBULATORY_CARE_PROVIDER_SITE_OTHER): Payer: Self-pay | Admitting: Physician Assistant

## 2017-07-13 VITALS — BP 122/34 | HR 118 | Temp 98.0°F | Wt 137.8 lb

## 2017-07-13 DIAGNOSIS — R21 Rash and other nonspecific skin eruption: Secondary | ICD-10-CM

## 2017-07-13 DIAGNOSIS — F1020 Alcohol dependence, uncomplicated: Secondary | ICD-10-CM

## 2017-07-13 MED ORDER — THIAMINE HCL 100 MG PO TABS: 100.0000 mg | ORAL_TABLET | Freq: Every day | ORAL | 1 refills | Status: DC

## 2017-07-13 MED ORDER — NALTREXONE HCL 50 MG PO TABS: 50.0000 mg | ORAL_TABLET | Freq: Every day | ORAL | 0 refills | Status: DC

## 2017-07-13 MED ORDER — HYDROXYZINE HCL 25 MG PO TABS: 25.0000 mg | ORAL_TABLET | Freq: Three times a day (TID) | ORAL | 0 refills | Status: DC | PRN

## 2017-07-13 MED ORDER — BACITRACIN 500 UNIT/GM EX OINT: 1.0000 "application " | TOPICAL_OINTMENT | Freq: Two times a day (BID) | CUTANEOUS | 0 refills | Status: DC

## 2017-07-13 MED FILL — NALTREXONE 50 MG TABLET: 50 | 30 days supply | Qty: 30 | Fill #0

## 2017-07-13 MED FILL — HYDROXYZINE PAM 25 MG CAP: 25 | 20 days supply | Qty: 60 | Fill #0

## 2017-07-13 NOTE — Progress Notes (Signed)
Subjective:  Patient ID: Theodore Brown, male    DOB: 12/12/1965  Age: 52 y.o. MRN: [id number]  CC: RASH  HPI  Theodore N Thompsonis a 51 y.o.malewith a medical history of alcohol abuse, alcoholic liver disease, marijuana use, ventral hernia, and anxiety presentsto f/u on alcoholism, lower extremity edema, and to address a rash. Says he has itching in the upper back and lower extremities. Can not sleep due to the itch. Attributed to a new roommate had come in and seemed to be dirty/unkept.     Patient has been drinking alcohol but says he is slowly decreasing the amount he is drinking. Taking Naltrexone and Thiamine as directed.     Outpatient Medications Prior to Visit  Medication Sig Dispense Refill  . ferrous sulfate 325 (65 FE) MG tablet Take 1 tablet (325 mg total) by mouth 2 (two) times daily with a meal. 60 tablet 1  . Folic Acid 5 MG CAPS Take 1 capsule (5 mg total) by mouth daily. 30 capsule 1  . furosemide (LASIX) 40 MG tablet Take 1 tablet (40 mg total) by mouth daily. 30 tablet 2  . LORazepam (ATIVAN) 2 MG tablet Take one tablet when symptoms of delerium tremens (DTs) are felt. You may take one every hour as long as symptoms of DTs are present. Do not take more than 10 mg per day (five pills) 25 tablet 0  . naltrexone (DEPADE) 50 MG tablet Take 1 tablet (50 mg total) daily by mouth. 30 tablet 0  . thiamine 100 MG tablet Take 1 tablet (100 mg total) daily by mouth. 30 tablet 1  . vitamin B-12 (CYANOCOBALAMIN) 250 MCG tablet Take 1 tablet (250 mcg total) by mouth daily. 30 tablet 1  . Diclofenac Sodium 3 % CREA Apply 1 application topically 3 (three) times daily. 100 g 1  . gabapentin (NEURONTIN) 300 MG capsule Take 1 capsule (300 mg total) by mouth 2 (two) times daily. (Patient not taking: Reported on 03/11/2017) 60 capsule 3   No facility-administered medications prior to visit.      ROS Review of Systems  Constitutional: Negative for chills, fever and  malaise/fatigue.  Eyes: Negative for blurred vision.  Respiratory: Negative for shortness of breath.   Cardiovascular: Negative for chest pain and palpitations.  Gastrointestinal: Negative for abdominal pain and nausea.  Genitourinary: Negative for dysuria and hematuria.  Musculoskeletal: Negative for joint pain and myalgias.  Skin: Positive for itching and rash.  Neurological: Negative for tingling and headaches.  Psychiatric/Behavioral: Negative for depression. The patient is not nervous/anxious.     Objective:  BP (!) 122/34 (BP Location: Left Arm, Patient Position: Sitting, Cuff Size: Normal)   Pulse (!) 118   Temp 98 F (36.7 C) (Oral)   Wt 137 lb 12.8 oz (62.5 kg)   SpO2 99%   BMI 22.24 kg/m   BP/Weight 07/13/2017 05/29/2017 03/29/2017  Systolic BP 122 112 120  Diastolic BP 34 56 70  Wt. (Lbs) 137.8 - 144  BMI 22.24 - 23.24      Physical Exam  Constitutional: He is oriented to person, place, and time.  Well developed, well nourished, NAD, polite  HENT:  Head: Normocephalic and atraumatic.  Eyes: Scleral icterus is present.  Neck: Normal range of motion. Neck supple. No thyromegaly present.  Cardiovascular: Normal rate, regular rhythm and normal heart sounds.  Pulmonary/Chest: Effort normal and breath sounds normal.  Musculoskeletal: He exhibits no edema.  Neurological: He is alert and oriented to person, place,  and time. No cranial nerve deficit. Coordination normal.  Skin: Skin is warm and dry.  Few denuded papules in clusters of three on the upper back and RLE. No cellulitis, bleeding, or suppuration.  Psychiatric: He has a normal mood and affect. His behavior is normal. Thought content normal.  Vitals reviewed.    Assessment & Plan:    1. Rash and nonspecific skin eruption - Begin hydrOXYzine (ATARAX/VISTARIL) 25 MG tablet; Take 1 tablet (25 mg total) by mouth 3 (three) times daily as needed.  Dispense: 60 tablet; Refill: 0 - Begin bacitracin 500 UNIT/GM  ointment; Apply 1 application topically 2 (two) times daily.  Dispense: 15 g; Refill: 0  2. Alcoholism (HCC) - Briefly discussed at this encounter. Seems to still be motivated to quit drinking. Will need focus on alcoholism at next appointment. - Refill thiamine 100 MG tablet; Take 1 tablet (100 mg total) by mouth daily.  Dispense: 30 tablet; Refill: 1 - Refill naltrexone (DEPADE) 50 MG tablet; Take 1 tablet (50 mg total) by mouth daily.  Dispense: 30 tablet; Refill: 0   Meds ordered this encounter  Medications  . hydrOXYzine (ATARAX/VISTARIL) 25 MG tablet    Sig: Take 1 tablet (25 mg total) by mouth 3 (three) times daily as needed.    Dispense:  60 tablet    Refill:  0    Order Specific Question:   Supervising Provider    Answer:   Quentin Angst [id number]  . bacitracin 500 UNIT/GM ointment    Sig: Apply 1 application topically 2 (two) times daily.    Dispense:  15 g    Refill:  0    Order Specific Question:   Supervising Provider    Answer:   Quentin Angst [id number]  . thiamine 100 MG tablet    Sig: Take 1 tablet (100 mg total) by mouth daily.    Dispense:  30 tablet    Refill:  1    Order Specific Question:   Supervising Provider    Answer:   Quentin Angst [id number]  . naltrexone (DEPADE) 50 MG tablet    Sig: Take 1 tablet (50 mg total) by mouth daily.    Dispense:  30 tablet    Refill:  0    Order Specific Question:   Supervising Provider    Answer:   Quentin Angst [id number]    Follow-up: Return in about 4 weeks (around 08/10/2017) for alcoholism.   Theodore Specter PA

## 2017-07-13 NOTE — Patient Instructions (Signed)
Bedbugs Bedbugs are tiny bugs that live in and around beds. They stay hidden during the day, and they come out at night and bite. Bedbugs need blood to live and grow. Where are bedbugs found? Bedbugs can be found anywhere, whether a place is clean or dirty. They are most often found in places where many people come and go, such as hotels, shelters, dorms, and health care settings. It is also common for them to be found in homes where there are many birds or bats nearby. What are bedbug bites like? A bedbug bite leaves a small red bump with a darker red dot in the middle. The bump may appear soon after a person is bitten or a day or more later. Bedbug bites usually do not hurt, but they may itch. Most people do not need treatment for bedbug bites. The bumps usually go away on their own in a few days. How do I check for bedbugs? Bedbugs are reddish-brown, oval, and flat. They range in size from 1 mm to 7 mm and they cannot fly. Look for bedbugs in these places:  On mattresses, bed frames, headboards, and box springs.  On drapes and curtains in bedrooms.  Under carpeting in bedrooms.  Behind electrical outlets.  Behind any wallpaper that is peeling.  Inside luggage.  Also look for black or red spots or stains on or near the bed. Stains can come from bedbugs that have been crushed or from bedbug waste. What should I do if I find bedbugs? When Traveling If you find bedbugs while traveling, check all of your possessions carefully before you bring them into your home. Consider throwing away anything that has bedbugs on it. At Home If you find bedbugs at home, your bedroom may need to be treated by a pest control expert. You may also need to throw away mattresses or luggage. To help keep bedbugs from coming back, consider taking these actions:  Put a plastic cover over your mattress.  Wash your clothes and bedding in water that is hotter than 120F (48.9C) and dry them on a hot setting.  Bedbugs are killed by high temperatures.  Vacuum often around the bed and in all of the cracks and crevices where the bugs might hide.  Carefully check all used furniture, bedding, or clothes that you bring into your home.  Eliminate bird nests and bat roosts that are near your home.  In Your Bed If you find bedbugs in your bed, consider wearing pajamas that have long sleeves and pant legs. Bedbugs usually bite areas of the skin that are not covered. This information is not intended to replace advice given to you by your health care provider. Make sure you discuss any questions you have with your health care provider. Document Released: 05/24/2010 Document Revised: 09/27/2015 Document Reviewed: 04/17/2014 Elsevier Interactive Patient Education  2018 [employer]  

## 2017-08-10 ENCOUNTER — Ambulatory Visit (INDEPENDENT_AMBULATORY_CARE_PROVIDER_SITE_OTHER): Payer: Self-pay | Admitting: Physician Assistant

## 2017-08-12 ENCOUNTER — Ambulatory Visit (INDEPENDENT_AMBULATORY_CARE_PROVIDER_SITE_OTHER): Payer: Medicaid Other | Admitting: Physician Assistant

## 2017-08-12 ENCOUNTER — Encounter (INDEPENDENT_AMBULATORY_CARE_PROVIDER_SITE_OTHER): Payer: Self-pay | Admitting: Physician Assistant

## 2017-08-12 VITALS — BP 96/57 | HR 81 | Temp 98.3°F | Resp 16 | Wt 139.0 lb

## 2017-08-12 DIAGNOSIS — K439 Ventral hernia without obstruction or gangrene: Secondary | ICD-10-CM | POA: Diagnosis not present

## 2017-08-12 DIAGNOSIS — Z79899 Other long term (current) drug therapy: Secondary | ICD-10-CM | POA: Diagnosis not present

## 2017-08-12 DIAGNOSIS — F1020 Alcohol dependence, uncomplicated: Secondary | ICD-10-CM

## 2017-08-12 DIAGNOSIS — R21 Rash and other nonspecific skin eruption: Secondary | ICD-10-CM | POA: Diagnosis not present

## 2017-08-12 MED ORDER — NALTREXONE HCL 50 MG PO TABS: 100.0000 mg | ORAL_TABLET | Freq: Every day | ORAL | 0 refills | Status: DC

## 2017-08-12 MED ORDER — FOLIC ACID 5 MG PO CAPS: 1.0000 | ORAL_CAPSULE | Freq: Every day | ORAL | 1 refills | Status: DC

## 2017-08-12 MED ORDER — DISULFIRAM 250 MG PO TABS: 250.0000 mg | ORAL_TABLET | Freq: Every day | ORAL | 0 refills | Status: DC

## 2017-08-12 MED ORDER — THIAMINE HCL 100 MG PO TABS: 100.0000 mg | ORAL_TABLET | Freq: Every day | ORAL | 1 refills | Status: DC

## 2017-08-12 MED ORDER — HYDROXYZINE HCL 25 MG PO TABS: 25.0000 mg | ORAL_TABLET | Freq: Three times a day (TID) | ORAL | 0 refills | Status: DC | PRN

## 2017-08-12 MED ORDER — BACITRACIN 500 UNIT/GM EX OINT: 1.0000 "application " | TOPICAL_OINTMENT | Freq: Two times a day (BID) | CUTANEOUS | 0 refills | Status: DC

## 2017-08-12 MED ORDER — VITAMIN B-12 250 MCG PO TABS: 250.0000 ug | ORAL_TABLET | Freq: Every day | ORAL | 1 refills | Status: DC

## 2017-08-12 NOTE — Progress Notes (Signed)
F/u ETOH Refill on anxiety medications And lasix BLE edema

## 2017-08-12 NOTE — Patient Instructions (Signed)

## 2017-08-12 NOTE — Progress Notes (Signed)
Subjective:  Patient ID: Theodore Brown, male    DOB: 07/11/1965  Age: 52 y.o. MRN: [id number]  CC: f/u rash  HPI Theodore N Thompsonis a 52 y.o.malewith a medical history of alcohol abuse, alcoholic liver disease, marijuana use, ventral hernia, and anxiety presentsto f/u on alcoholism and rash.     Says his drinking is now decreased thanks to Naltrexone. Drinks maybe two 40 oz beers a day. Used to drink approximately nine 16 oz beers a day. Has taken his thiamine but has run out. Does not endorse    He has two new lesions on his head. Has old lesions on the back but still somewhat pruritic. Attributed to bed bugs. Says he had the apartment managers conduct a "heat treatment" to get rid of the bed bugs. Requests a refill of the bacitracin and hydroxyzine. Does not endorse CP, palpitations, SOB, HA, weakness, tingling, numbness, lightheadedness, abdominal pain, f/c/n/v, or GI/GU sxs.     Outpatient Medications Prior to Visit  Medication Sig Dispense Refill  . ferrous sulfate 325 (65 FE) MG tablet Take 1 tablet (325 mg total) by mouth 2 (two) times daily with a meal. 60 tablet 1  . bacitracin 500 UNIT/GM ointment Apply 1 application topically 2 (two) times daily. (Patient not taking: Reported on 08/12/2017) 15 g 0  . Diclofenac Sodium 3 % CREA Apply 1 application topically 3 (three) times daily. (Patient not taking: Reported on 08/12/2017) 100 g 1  . Folic Acid 5 MG CAPS Take 1 capsule (5 mg total) by mouth daily. (Patient not taking: Reported on 08/12/2017) 30 capsule 1  . furosemide (LASIX) 40 MG tablet Take 1 tablet (40 mg total) by mouth daily. (Patient not taking: Reported on 08/12/2017) 30 tablet 2  . gabapentin (NEURONTIN) 300 MG capsule Take 1 capsule (300 mg total) by mouth 2 (two) times daily. (Patient not taking: Reported on 03/11/2017) 60 capsule 3  . hydrOXYzine (ATARAX/VISTARIL) 25 MG tablet Take 1 tablet (25 mg total) by mouth 3 (three) times daily as needed. (Patient not taking:  Reported on 08/12/2017) 60 tablet 0  . LORazepam (ATIVAN) 2 MG tablet Take one tablet when symptoms of delerium tremens (DTs) are felt. You may take one every hour as long as symptoms of DTs are present. Do not take more than 10 mg per day (five pills) (Patient not taking: Reported on 08/12/2017) 25 tablet 0  . naltrexone (DEPADE) 50 MG tablet Take 1 tablet (50 mg total) by mouth daily. (Patient not taking: Reported on 08/12/2017) 30 tablet 0  . thiamine 100 MG tablet Take 1 tablet (100 mg total) by mouth daily. (Patient not taking: Reported on 08/12/2017) 30 tablet 1  . vitamin B-12 (CYANOCOBALAMIN) 250 MCG tablet Take 1 tablet (250 mcg total) by mouth daily. (Patient not taking: Reported on 08/12/2017) 30 tablet 1   No facility-administered medications prior to visit.      ROS Review of Systems  Constitutional: Negative for chills, fever and malaise/fatigue.  Eyes: Negative for blurred vision.  Respiratory: Negative for shortness of breath.   Cardiovascular: Negative for chest pain and palpitations.  Gastrointestinal: Negative for abdominal pain and nausea.  Genitourinary: Negative for dysuria and hematuria.  Musculoskeletal: Negative for joint pain and myalgias.  Skin: Negative for rash.  Neurological: Negative for tingling and headaches.  Psychiatric/Behavioral: Negative for depression. The patient is not nervous/anxious.     Objective:  BP (!) 96/57   Pulse 81   Temp 98.3 F (36.8 C) (Oral)  Resp 16   Wt 139 lb (63 kg)   SpO2 99%   BMI 22.44 kg/m   BP/Weight 08/12/2017 07/13/2017 05/29/2017  Systolic BP 96 122 112  Diastolic BP 57 34 56  Wt. (Lbs) 139 137.8 -  BMI 22.44 22.24 -      Physical Exam  Constitutional: He is oriented to person, place, and time.  Thin, NAD, polite  HENT:  Head: Normocephalic and atraumatic.  Eyes: Conjunctivae are normal. No scleral icterus.  Neck: No thyromegaly present.  Cardiovascular: Normal rate, regular rhythm and normal heart sounds.   Pulmonary/Chest: Effort normal and breath sounds normal.  Abdominal: Soft. Bowel sounds are normal. He exhibits no distension and no mass. There is no tenderness. There is no guarding.  Reducible ventral hernia  Musculoskeletal: He exhibits no edema.  Lymphadenopathy:    He has no cervical adenopathy.  Neurological: He is alert and oriented to person, place, and time. No cranial nerve deficit. Coordination normal.  Skin: Skin is warm and dry. No rash noted.  Two ovoid hypopigmented lesions on left side of scalp/head. No crusting, scaling, suppuration, or bleeding. Few lesions on back; lesion papular and excoriated without signs of infection.      Assessment & Plan:   1. Rash and nonspecific skin eruption - hydrOXYzine (ATARAX/VISTARIL) 25 MG tablet; Take 1 tablet (25 mg total) by mouth 3 (three) times daily as needed.  Dispense: 60 tablet; Refill: 0 - bacitracin 500 UNIT/GM ointment; Apply 1 application topically 2 (two) times daily.  Dispense: 15 g; Refill: 0  2. Alcoholism (HCC) - Patient seems to be making improvement. Reportedly drinking approximately 50% less.  - Increase naltrexone (DEPADE) 50 MG tablet; Take 2 tablets (100 mg total) by mouth daily.  Dispense: 60 tablet; Refill: 0 - Begin disulfiram (ANTABUSE) 250 MG tablet; Take 1 tablet (250 mg total) by mouth daily.  Dispense: 30 tablet; Refill: 0 - Refill thiamine 100 MG tablet; Take 1 tablet (100 mg total) by mouth daily.  Dispense: 30 tablet; Refill: 1 - Refill Folic Acid 5 MG CAPS; Take 1 capsule (5 mg total) by mouth daily.  Dispense: 30 capsule; Refill: 1 - Refill vitamin B-12 (CYANOCOBALAMIN) 250 MCG tablet; Take 1 tablet (250 mcg total) by mouth daily.  Dispense: 30 tablet; Refill: 1 - Drug Screen, Urine  3. Ventral hernia without obstruction or gangrene - Hernia still reducible and without complications. Pt still a poor candidate for repair due to his alcoholism. Will refer to surgery once alcoholism is successfully  treated.   4. High risk medication use - Drug Screen, Urine   Meds ordered this encounter  Medications  . hydrOXYzine (ATARAX/VISTARIL) 25 MG tablet    Sig: Take 1 tablet (25 mg total) by mouth 3 (three) times daily as needed.    Dispense:  60 tablet    Refill:  0    Order Specific Question:   Supervising Provider    Answer:   Quentin Angst [id number]  . bacitracin 500 UNIT/GM ointment    Sig: Apply 1 application topically 2 (two) times daily.    Dispense:  15 g    Refill:  0    Order Specific Question:   Supervising Provider    Answer:   Quentin Angst [id number]  . naltrexone (DEPADE) 50 MG tablet    Sig: Take 2 tablets (100 mg total) by mouth daily.    Dispense:  60 tablet    Refill:  0    Order Specific Question:  Supervising Provider    Answer:   Quentin Angst [id number]  . thiamine 100 MG tablet    Sig: Take 1 tablet (100 mg total) by mouth daily.    Dispense:  30 tablet    Refill:  1    Order Specific Question:   Supervising Provider    Answer:   Quentin Angst [id number]  . Folic Acid 5 MG CAPS    Sig: Take 1 capsule (5 mg total) by mouth daily.    Dispense:  30 capsule    Refill:  1    Order Specific Question:   Supervising Provider    Answer:   Quentin Angst [id number]  . vitamin B-12 (CYANOCOBALAMIN) 250 MCG tablet    Sig: Take 1 tablet (250 mcg total) by mouth daily.    Dispense:  30 tablet    Refill:  1    Order Specific Question:   Supervising Provider    Answer:   Quentin Angst [id number]  . disulfiram (ANTABUSE) 250 MG tablet    Sig: Take 1 tablet (250 mg total) by mouth daily.    Dispense:  30 tablet    Refill:  0    Order Specific Question:   Supervising Provider    Answer:   Quentin Angst [id number]    Follow-up: Return in about 1 month (around 09/09/2017) for alcohol abuse.   Loletta Specter PA

## 2017-08-13 LAB — DRUG SCREEN, URINE
AMPHETAMINES, URINE: NEGATIVE ng/mL
BARBITURATE SCREEN URINE: NEGATIVE ng/mL
Benzodiazepine Quant, Ur: NEGATIVE ng/mL
CANNABINOID QUANT UR: POSITIVE ng/mL — AB
COCAINE (METAB.): POSITIVE ng/mL — AB
Opiate Quant, Ur: NEGATIVE ng/mL
PCP Quant, Ur: NEGATIVE ng/mL

## 2017-08-14 ENCOUNTER — Telehealth (INDEPENDENT_AMBULATORY_CARE_PROVIDER_SITE_OTHER): Payer: Self-pay

## 2017-08-14 NOTE — Telephone Encounter (Signed)
-----   Message from Roger David Gomez, PA-C sent at 08/14/2017  1:11 PM EDT ----- Positive cocaine and marijuana. Continue treatment as directed. I will need to speak to him about going to rehab at his f/u appointment. 

## 2017-08-14 NOTE — Telephone Encounter (Signed)
Left message with patients mother to have patient call . Maryjean Mornempestt S Stephenson Cichy, CMA

## 2017-08-18 ENCOUNTER — Telehealth (INDEPENDENT_AMBULATORY_CARE_PROVIDER_SITE_OTHER): Payer: Self-pay

## 2017-08-18 NOTE — Telephone Encounter (Signed)
-----   Message from Loletta Specteroger David Gomez, PA-C sent at 08/14/2017  1:11 PM EDT ----- Positive cocaine and marijuana. Continue treatment as directed. I will need to speak to him about going to rehab at his f/u appointment.

## 2017-08-18 NOTE — Telephone Encounter (Signed)
Patient not available. Will mail results. Maryjean Mornempestt S Roberts, CMA

## 2017-08-19 ENCOUNTER — Encounter (INDEPENDENT_AMBULATORY_CARE_PROVIDER_SITE_OTHER): Payer: Self-pay

## 2017-08-27 ENCOUNTER — Other Ambulatory Visit: Payer: Self-pay

## 2017-08-27 ENCOUNTER — Inpatient Hospital Stay (HOSPITAL_COMMUNITY)
Admission: EM | Admit: 2017-08-27 | Discharge: 2017-09-01 | DRG: 896 | Disposition: A | Discharge status: Home Health | Payer: Medicaid Other | Attending: Internal Medicine | Admitting: Internal Medicine

## 2017-08-27 ENCOUNTER — Ambulatory Visit (HOSPITAL_COMMUNITY)
Admission: EM | Admit: 2017-08-27 | Discharge: 2017-08-27 | Disposition: A | Discharge status: Home / Self Care | Payer: Medicaid Other

## 2017-08-27 ENCOUNTER — Encounter (HOSPITAL_COMMUNITY): Payer: Self-pay

## 2017-08-27 ENCOUNTER — Emergency Department (HOSPITAL_COMMUNITY): Payer: Medicaid Other

## 2017-08-27 DIAGNOSIS — F10239 Alcohol dependence with withdrawal, unspecified: Principal | ICD-10-CM | POA: Diagnosis present

## 2017-08-27 DIAGNOSIS — F1020 Alcohol dependence, uncomplicated: Secondary | ICD-10-CM

## 2017-08-27 DIAGNOSIS — E639 Nutritional deficiency, unspecified: Secondary | ICD-10-CM | POA: Diagnosis present

## 2017-08-27 DIAGNOSIS — G9341 Metabolic encephalopathy: Secondary | ICD-10-CM | POA: Diagnosis not present

## 2017-08-27 DIAGNOSIS — Z79899 Other long term (current) drug therapy: Secondary | ICD-10-CM

## 2017-08-27 DIAGNOSIS — R4182 Altered mental status, unspecified: Secondary | ICD-10-CM

## 2017-08-27 DIAGNOSIS — R748 Abnormal levels of other serum enzymes: Secondary | ICD-10-CM | POA: Diagnosis not present

## 2017-08-27 DIAGNOSIS — F1910 Other psychoactive substance abuse, uncomplicated: Secondary | ICD-10-CM | POA: Diagnosis present

## 2017-08-27 DIAGNOSIS — G9340 Encephalopathy, unspecified: Secondary | ICD-10-CM | POA: Diagnosis present

## 2017-08-27 DIAGNOSIS — M1990 Unspecified osteoarthritis, unspecified site: Secondary | ICD-10-CM | POA: Diagnosis present

## 2017-08-27 DIAGNOSIS — E512 Wernicke's encephalopathy: Secondary | ICD-10-CM

## 2017-08-27 DIAGNOSIS — K709 Alcoholic liver disease, unspecified: Secondary | ICD-10-CM | POA: Diagnosis present

## 2017-08-27 DIAGNOSIS — D61818 Other pancytopenia: Secondary | ICD-10-CM | POA: Diagnosis present

## 2017-08-27 DIAGNOSIS — K439 Ventral hernia without obstruction or gangrene: Secondary | ICD-10-CM | POA: Diagnosis present

## 2017-08-27 DIAGNOSIS — F1410 Cocaine abuse, uncomplicated: Secondary | ICD-10-CM | POA: Diagnosis present

## 2017-08-27 DIAGNOSIS — F1210 Cannabis abuse, uncomplicated: Secondary | ICD-10-CM | POA: Diagnosis present

## 2017-08-27 DIAGNOSIS — F10229 Alcohol dependence with intoxication, unspecified: Secondary | ICD-10-CM | POA: Diagnosis present

## 2017-08-27 DIAGNOSIS — E876 Hypokalemia: Secondary | ICD-10-CM | POA: Diagnosis present

## 2017-08-27 DIAGNOSIS — D649 Anemia, unspecified: Secondary | ICD-10-CM | POA: Diagnosis present

## 2017-08-27 DIAGNOSIS — E538 Deficiency of other specified B group vitamins: Secondary | ICD-10-CM | POA: Diagnosis present

## 2017-08-27 LAB — CBG MONITORING, ED: GLUCOSE-CAPILLARY: 93 mg/dL (ref 65–99)

## 2017-08-27 LAB — CBC
HCT: 28.1 % — ABNORMAL LOW (ref 39.0–52.0)
HEMOGLOBIN: 9 g/dL — AB (ref 13.0–17.0)
MCH: 27.4 pg (ref 26.0–34.0)
MCHC: 32 g/dL (ref 30.0–36.0)
MCV: 85.4 fL (ref 78.0–100.0)
Platelets: 65 10*3/uL — ABNORMAL LOW (ref 150–400)
RBC: 3.29 MIL/uL — AB (ref 4.22–5.81)
RDW: 19.7 % — ABNORMAL HIGH (ref 11.5–15.5)
WBC: 4.6 10*3/uL (ref 4.0–10.5)

## 2017-08-27 LAB — PROTIME-INR
INR: 1.38
PROTHROMBIN TIME: 16.9 s — AB (ref 11.4–15.2)

## 2017-08-27 LAB — COMPREHENSIVE METABOLIC PANEL
ALK PHOS: 161 U/L — AB (ref 38–126)
ALT: 35 U/L (ref 17–63)
ANION GAP: 9 (ref 5–15)
AST: 86 U/L — ABNORMAL HIGH (ref 15–41)
Albumin: 2.7 g/dL — ABNORMAL LOW (ref 3.5–5.0)
BUN: 10 mg/dL (ref 6–20)
CALCIUM: 9.3 mg/dL (ref 8.9–10.3)
CO2: 22 mmol/L (ref 22–32)
Chloride: 105 mmol/L (ref 101–111)
Creatinine, Ser: 1 mg/dL (ref 0.61–1.24)
GLUCOSE: 97 mg/dL (ref 65–99)
Potassium: 3.8 mmol/L (ref 3.5–5.1)
Sodium: 136 mmol/L (ref 135–145)
Total Bilirubin: 3.1 mg/dL — ABNORMAL HIGH (ref 0.3–1.2)
Total Protein: 8 g/dL (ref 6.5–8.1)

## 2017-08-27 LAB — AMMONIA: AMMONIA: 26 umol/L (ref 9–35)

## 2017-08-27 LAB — I-STAT TROPONIN, ED: TROPONIN I, POC: 0 ng/mL (ref 0.00–0.08)

## 2017-08-27 LAB — URINALYSIS, ROUTINE W REFLEX MICROSCOPIC
Bilirubin Urine: NEGATIVE
Glucose, UA: NEGATIVE mg/dL
Hgb urine dipstick: NEGATIVE
Ketones, ur: 5 mg/dL — AB
Leukocytes, UA: NEGATIVE
Nitrite: NEGATIVE
Protein, ur: NEGATIVE mg/dL
Specific Gravity, Urine: 1.02 (ref 1.005–1.030)
pH: 5 (ref 5.0–8.0)

## 2017-08-27 LAB — RAPID URINE DRUG SCREEN, HOSP PERFORMED
AMPHETAMINES: NOT DETECTED
Barbiturates: NOT DETECTED
Benzodiazepines: NOT DETECTED
Cocaine: POSITIVE — AB
Opiates: NOT DETECTED
Tetrahydrocannabinol: POSITIVE — AB

## 2017-08-27 LAB — ETHANOL: Alcohol, Ethyl (B): <10 mg/dL (ref <10)

## 2017-08-27 MED ORDER — ADULT MULTIVITAMIN W/MINERALS CH
1.0000 | ORAL_TABLET | Freq: Every day | ORAL | Status: DC
  Administered 2017-08-28 – 2017-09-01 (×3): 1 via ORAL
  Filled 2017-08-27 (×5): qty 1

## 2017-08-27 MED ORDER — LORAZEPAM 2 MG/ML IJ SOLN
0.0000 mg | Freq: Two times a day (BID) | INTRAMUSCULAR | Status: AC
  Administered 2017-08-30: 2 mg via INTRAVENOUS
  Filled 2017-08-27 (×2): qty 1

## 2017-08-27 MED ORDER — THIAMINE HCL 100 MG/ML IJ SOLN
100.0000 mg | Freq: Once | INTRAMUSCULAR | Status: AC
  Administered 2017-08-27: 100 mg via INTRAVENOUS
  Filled 2017-08-27: qty 2

## 2017-08-27 MED ORDER — LORAZEPAM 2 MG/ML IJ SOLN
1.0000 mg | Freq: Four times a day (QID) | INTRAMUSCULAR | Status: AC | PRN
  Administered 2017-08-28 – 2017-08-29 (×3): 1 mg via INTRAVENOUS
  Filled 2017-08-27: qty 1

## 2017-08-27 MED ORDER — FOLIC ACID 1 MG PO TABS
1.0000 mg | ORAL_TABLET | Freq: Every day | ORAL | Status: DC
  Administered 2017-08-28 – 2017-09-01 (×3): 1 mg via ORAL
  Filled 2017-08-27 (×5): qty 1

## 2017-08-27 MED ORDER — IPRATROPIUM-ALBUTEROL 0.5-2.5 (3) MG/3ML IN SOLN: 3.0000 mL | Freq: Four times a day (QID) | RESPIRATORY_TRACT | Status: DC | PRN

## 2017-08-27 MED ORDER — LORAZEPAM 2 MG/ML IJ SOLN
0.0000 mg | Freq: Four times a day (QID) | INTRAMUSCULAR | Status: AC
  Administered 2017-08-28: 1 mg via INTRAVENOUS
  Administered 2017-08-29 (×2): 2 mg via INTRAVENOUS
  Filled 2017-08-27 (×5): qty 1

## 2017-08-27 MED ORDER — ONDANSETRON HCL 4 MG PO TABS: 4.0000 mg | ORAL_TABLET | Freq: Four times a day (QID) | ORAL | Status: DC | PRN

## 2017-08-27 MED ORDER — ONDANSETRON HCL 4 MG/2ML IJ SOLN: 4.0000 mg | Freq: Four times a day (QID) | INTRAMUSCULAR | Status: DC | PRN

## 2017-08-27 MED ORDER — THIAMINE HCL 100 MG/ML IJ SOLN
500.0000 mg | INTRAVENOUS | Status: AC
  Administered 2017-08-28 – 2017-08-30 (×4): 500 mg via INTRAVENOUS
  Filled 2017-08-27 (×4): qty 5

## 2017-08-27 MED ORDER — LORAZEPAM 1 MG PO TABS: 1.0000 mg | ORAL_TABLET | Freq: Four times a day (QID) | ORAL | Status: AC | PRN

## 2017-08-27 MED ORDER — THIAMINE HCL 100 MG/ML IJ SOLN
400.0000 mg | Freq: Once | INTRAMUSCULAR | Status: AC
  Administered 2017-08-27: 400 mg via INTRAVENOUS
  Filled 2017-08-27: qty 4

## 2017-08-27 NOTE — ED Notes (Signed)
Staffing st's no safety sitter available at this time

## 2017-08-27 NOTE — ED Provider Notes (Signed)
Patient placed in Quick Look pathway, seen and evaluated   Chief Complaint: Altered mental status  HPI:    52 year old male presents today altered mental status.  Per family they checked on patient yesterday and he was confused, again confused this morning.  Patient provides very little history throughout my exam.  Is alert but answers inappropriately to questions, he has difficulty following commands, he appears to have no acute strength or sensory deficits on my exam.  No acute infectious etiology presently.  ROS: Level 5 caveat (one)  Physical Exam:   Gen: No distress  Neuro: Awake and Alert  Skin: Warm    Focused Exam: Difficulty following commands, no strength or sensory deficits pupils equal round reactive to light   Initiation of care has begun. The patient has been counseled on the process, plan, and necessity for staying for the completion/evaluation, and the remainder of the medical screening examination    Rosalio LoudHedges, Nimesh Riolo, PA-C 08/27/17 1307    Linwood DibblesKnapp, Jon, MD 08/28/17 1429

## 2017-08-27 NOTE — ED Triage Notes (Addendum)
[facility] arrived with family, [facility] family was concern because pt was "talking disoriented last night and this morning". Was unable to be seen at PCP was walked down from [facility]. Family also notices stumbling when walking. Pt reports his legs have been swelling off and on for 3 years. Pt states he has been out of medication for 1 1/2 weeks.
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
## 2017-08-27 NOTE — ED Notes (Signed)
Pt called for Vitals. No Answer.

## 2017-08-27 NOTE — ED Notes (Addendum)
Pt visitor brought the patient back to the department, the patient appeared altered stating that he had already been seen by the doctor and had been discharged. Pt visitor states that he found him walking on [address] and brought him back to the ED.

## 2017-08-27 NOTE — ED Notes (Signed)
Pt called over intercom, no answer

## 2017-08-27 NOTE — ED Notes (Addendum)
Pt visitor came looking for the patient and couldn't find him in the waiting room. Visitor stated that he thinks the pt left. Pt name called, no answer.

## 2017-08-27 NOTE — ED Notes (Signed)
When asked why he is here today patient states "I just need my medication refilled. I left the list out in the car but I think it's called Tylenol". States "I just saw the doctor up the street and he told me to come here and get my medication refilled". Patient denies any other symptoms at this time.

## 2017-08-28 DIAGNOSIS — G9341 Metabolic encephalopathy: Secondary | ICD-10-CM

## 2017-08-28 LAB — COMPREHENSIVE METABOLIC PANEL
ALK PHOS: 135 U/L — AB (ref 38–126)
ALT: 30 U/L (ref 17–63)
ALT: 31 U/L (ref 17–63)
ANION GAP: 9 (ref 5–15)
AST: 80 U/L — ABNORMAL HIGH (ref 15–41)
AST: 77 U/L — AB (ref 15–41)
Albumin: 2.4 g/dL — ABNORMAL LOW (ref 3.5–5.0)
Albumin: 2.4 g/dL — ABNORMAL LOW (ref 3.5–5.0)
Alkaline Phosphatase: 133 U/L — ABNORMAL HIGH (ref 38–126)
Anion gap: 11 (ref 5–15)
BILIRUBIN TOTAL: 2.9 mg/dL — AB (ref 0.3–1.2)
BILIRUBIN TOTAL: 2.7 mg/dL — AB (ref 0.3–1.2)
BUN: 8 mg/dL (ref 6–20)
BUN: 9 mg/dL (ref 6–20)
CALCIUM: 8.6 mg/dL — AB (ref 8.9–10.3)
CO2: 22 mmol/L (ref 22–32)
CO2: 20 mmol/L — ABNORMAL LOW (ref 22–32)
CREATININE: 1.03 mg/dL (ref 0.61–1.24)
CREATININE: 0.93 mg/dL (ref 0.61–1.24)
Calcium: 8.7 mg/dL — ABNORMAL LOW (ref 8.9–10.3)
Chloride: 108 mmol/L (ref 101–111)
Chloride: 106 mmol/L (ref 101–111)
GFR calc non Af Amer: >60 mL/min (ref >60)
Glucose, Bld: 69 mg/dL (ref 65–99)
Glucose, Bld: 86 mg/dL (ref 65–99)
Potassium: 3.3 mmol/L — ABNORMAL LOW (ref 3.5–5.1)
Potassium: 3.5 mmol/L (ref 3.5–5.1)
SODIUM: 139 mmol/L (ref 135–145)
Sodium: 137 mmol/L (ref 135–145)
TOTAL PROTEIN: 6.8 g/dL (ref 6.5–8.1)
TOTAL PROTEIN: 7 g/dL (ref 6.5–8.1)

## 2017-08-28 LAB — CBC
HCT: 26.1 % — ABNORMAL LOW (ref 39.0–52.0)
Hemoglobin: 8.4 g/dL — ABNORMAL LOW (ref 13.0–17.0)
MCH: 27.8 pg (ref 26.0–34.0)
MCHC: 32.2 g/dL (ref 30.0–36.0)
MCV: 86.4 fL (ref 78.0–100.0)
Platelets: 55 K/uL — ABNORMAL LOW (ref 150–400)
RBC: 3.02 MIL/uL — ABNORMAL LOW (ref 4.22–5.81)
RDW: 20 % — ABNORMAL HIGH (ref 11.5–15.5)
WBC: 4.1 K/uL (ref 4.0–10.5)

## 2017-08-28 LAB — TROPONIN I
Troponin I: <0.03 ng/mL (ref <0.03)
Troponin I: <0.03 ng/mL (ref <0.03)

## 2017-08-28 LAB — TSH: TSH: 1.432 u[IU]/mL (ref 0.350–4.500)

## 2017-08-28 LAB — HIV ANTIBODY (ROUTINE TESTING W REFLEX): HIV SCREEN 4TH GENERATION: NONREACTIVE

## 2017-08-28 MED ORDER — HYDRALAZINE HCL 20 MG/ML IJ SOLN: 10.0000 mg | INTRAMUSCULAR | Status: DC | PRN

## 2017-08-28 MED ORDER — POTASSIUM CHLORIDE CRYS ER 20 MEQ PO TBCR
40.0000 meq | EXTENDED_RELEASE_TABLET | Freq: Once | ORAL | Status: DC
  Filled 2017-08-28: qty 2

## 2017-08-28 MED ORDER — POTASSIUM CHLORIDE 10 MEQ/100ML IV SOLN
10.0000 meq | INTRAVENOUS | Status: AC
  Administered 2017-08-28 (×3): 10 meq via INTRAVENOUS
  Filled 2017-08-28 (×4): qty 100

## 2017-08-28 NOTE — Care Management Note (Addendum)
Case Management Note  Patient Details  Name: Theodore Brown MRN: [id number] Date of Birth: 12/09/1965  Subjective/Objective:     Pt admitted with acute metabolic encephalopathy. He is from home alone. Pts mother called bedside RN concerned about patient not having power at home.                Action/Plan: PT recommending SNF. CSW aware.  CSW updated about his loss of power. CM following for d/c needs, physician orders.   Addendum: [place] [facility] [place] [facility] [place].   Expected Discharge Date:                  Expected Discharge Plan:  Home/Self Care  In-House Referral:  Clinical Social Work  Discharge planning Services     Post Acute Care Choice:    Choice offered to:     DME Arranged:    DME Agency:     HH Arranged:    HH Agency:     Status of Service:  In process, will continue to follow  If discussed at Long Length of Stay Meetings, dates discussed:    Additional Comments:  Kermit BaloKelli F Elisheva Fallas, RN 08/28/2017, 3:01 PM

## 2017-08-28 NOTE — Progress Notes (Addendum)
PROGRESS NOTE    Theodore Brown  ZOX:[id number] DOB: 05/24/1965 DOA: 08/27/2017 PCP: Loletta Specter, PA-C   Brief Narrative: Patient is a 52 year old male with positive for alcohol abuse, alcoholic liver disease, pancytopenia, ventral hernia who presents to the emergency department with altered mental status.  Patient was admitted for further evaluation.  Alcohol intoxication suspected .There was also concern for Wernicke's encephalopathy.  Assessment & Plan:   Principal Problem:   Acute metabolic encephalopathy Active Problems:   Alcoholic liver disease (HCC)   Pancytopenia (HCC)   Elevated liver enzymes   Polysubstance abuse (HCC)   Acute encephalopathy  Acute metabolic encephalopathy:  Could be multifactorial.  Alcohol intoxication suspected but alcohol level undetectable.  Ammonia level normal.  Patient's UDS was also positive for cocaine and marijuana.  Possible Wernicke's encephalopathy versus alcohol withdrawal delirium. Started on thiamine supplementation.Also folic acid. Started on CIWA protocol. Corporate investment banker.  Physical therapy requested.  Polysubstance abuse: Continues to drink significant amount of alcohol.  UDS positive for cocaine and marijuana.  Pancytopenia: Currently H&H and platelets level stable.  We will continue to monitor .  Elevated Liver enzymes: Chronic.  Alcoholic pattern.  INR/PT normal.  Ventral hernia: Chronic.  Reducible  Hypokalemia: Supplemented.  Will check levels tomorrow.   DVT prophylaxis: SCD Code Status: Full Family Communication: None present at the bedside Disposition Plan: Home after resolution of alcohol withdrawal/confusion   Consultants: None  Procedures: None  Antimicrobials: None  Subjective: Patient seen and examined the bedside this morning.  Was sleeping.  Briefly awake and opened his eyes but fell back to sleep again.  Looked comfortable.  Objective: Vitals:   08/27/17 2352 08/28/17 0445 08/28/17 0833  08/28/17 1258  BP: (!) 146/72 (!) 142/72 127/71 130/70  Pulse: 81 (!) 102 96 89  Resp: 20 (!) 22 18 20   Temp:   98.4 F (36.9 C) 98.3 F (36.8 C)  TempSrc:   Oral Oral  SpO2: 100% 100% 100% 100%  Weight:      Height:        Intake/Output Summary (Last 24 hours) at 08/28/2017 1402 Last data filed at 08/28/2017 1206 Gross per 24 hour  Intake 60 ml  Output 0 ml  Net 60 ml   Filed Weights   08/27/17 1248  Weight: 63.5 kg (140 lb)    Examination:  General exam: Appears calm and comfortable ,Not in distress,average built,sleeping HEENT:PERRL,Oral mucosa moist, Ear/Nose normal on gross exam Respiratory system: Bilateral equal air entry, normal vesicular breath sounds, no wheezes or crackles  Cardiovascular system: S1 & S2 heard, RRR. No JVD, murmurs, rubs, gallops or clicks. No pedal edema. Gastrointestinal system: Abdomen is nondistended, soft and nontender. No organomegaly or masses felt. Normal bowel sounds heard.ventral hernia Central nervous system: Alert and oriented. No focal neurological deficits. Extremities: No edema, no clubbing ,no cyanosis, distal peripheral pulses palpable. Skin: No rashes, lesions or ulcers,no icterus ,no pallor   Data Reviewed: I have personally reviewed following labs and imaging studies  CBC: Recent Labs  Lab 08/27/17 1249 08/28/17 0610  WBC 4.6 4.1  HGB 9.0* 8.4*  HCT 28.1* 26.1*  MCV 85.4 86.4  PLT 65* 55*   Basic Metabolic Panel: Recent Labs  Lab 08/27/17 1249 08/28/17 0610  NA 136 139  K 3.8 3.3*  CL 105 108  CO2 22 22  GLUCOSE 97 69  BUN 10 8  CREATININE 1.00 1.03  CALCIUM 9.3 8.6*   GFR: Estimated Creatinine Clearance: 77.1 mL/min (by C-G formula  based on SCr of 1.03 mg/dL). Liver Function Tests: Recent Labs  Lab 08/27/17 1249 08/28/17 0610  AST 86* 80*  ALT 35 30  ALKPHOS 161* 135*  BILITOT 3.1* 2.9*  PROT 8.0 6.8  ALBUMIN 2.7* 2.4*   No results for input(s): LIPASE, AMYLASE in the last 168 hours. Recent  Labs  Lab 08/27/17 1735  AMMONIA 26   Coagulation Profile: Recent Labs  Lab 08/27/17 2322  INR 1.38   Cardiac Enzymes: Recent Labs  Lab 08/28/17 0013 08/28/17 0610 08/28/17 1156  TROPONINI <0.03 <0.03 <0.03   BNP (last 3 results) No results for input(s): PROBNP in the last 8760 hours. HbA1C: No results for input(s): HGBA1C in the last 72 hours. CBG: Recent Labs  Lab 08/27/17 1305  GLUCAP 93   Lipid Profile: No results for input(s): CHOL, HDL, LDLCALC, TRIG, CHOLHDL, LDLDIRECT in the last 72 hours. Thyroid Function Tests: Recent Labs    08/27/17 2322  TSH 1.432   Anemia Panel: No results for input(s): VITAMINB12, FOLATE, FERRITIN, TIBC, IRON, RETICCTPCT in the last 72 hours. Sepsis Labs: No results for input(s): PROCALCITON, LATICACIDVEN in the last 168 hours.  No results found for this or any previous visit (from the past 240 hour(s)).       Radiology Studies: Dg Chest 2 View  Result Date: 08/27/2017 CLINICAL DATA:  Altered mental status EXAM: CHEST - 2 VIEW COMPARISON:  January 04, 2013 FINDINGS: There is scarring in the left mid lung region. There is no edema or consolidation. The heart size and pulmonary vascularity are normal. No adenopathy. No evident bone lesions. IMPRESSION: Scarring left mid lung. No edema or consolidation. Stable cardiac silhouette. Electronically Signed   By: Bretta BangWilliam  Woodruff III M.D.   On: 08/27/2017 13:25   Ct Head Wo Contrast  Result Date: 08/27/2017 CLINICAL DATA:  Altered mental status.  Hypertension. EXAM: CT HEAD WITHOUT CONTRAST TECHNIQUE: Contiguous axial images were obtained from the base of the skull through the vertex without intravenous contrast. COMPARISON:  01/04/2013 FINDINGS: Brain: Generalized brain atrophy. Mild chronic small-vessel ischemic changes of the pons, basal ganglia and hemispheric white matter. No large vessel territory infarction. No sign of acute infarction. No mass lesion, hemorrhage, hydrocephalus or  extra-axial collection. Vascular: There is atherosclerotic calcification of the major vessels at the base of the brain. Skull: Negative Sinuses/Orbits: Sinuses are clear. Old right orbital floor/inferior lamina papyracea fracture. Old appearing nasal fractures. Other: None significant IMPRESSION: No acute finding.  Atrophy and chronic small-vessel ischemic change. Old right orbital and nasal fractures. Electronically Signed   By: Paulina FusiMark  Shogry M.D.   On: 08/27/2017 13:56        Scheduled Meds: . folic acid  1 mg Oral Daily  . LORazepam  0-4 mg Intravenous Q6H   Followed by  . [START ON 08/29/2017] LORazepam  0-4 mg Intravenous Q12H  . multivitamin with minerals  1 tablet Oral Daily  . potassium chloride  40 mEq Oral Once   Continuous Infusions: . potassium chloride    . thiamine injection Stopped (08/28/17 0417)     LOS: 0 days    Time spent: More than 50% of that time was spent in counseling and/or coordination of care.      Burnadette PopAmrit Dreshaun Stene, MD [facility] Pager [phone]  If 7PM-7AM, please contact night-coverage [email or web address] Password [email or web address] 08/28/2017, 2:02 PM

## 2017-08-28 NOTE — Evaluation (Signed)
Physical Therapy Evaluation Patient Details Name: Theodore Brown MRN: [id number] DOB: 1965-11-07 Today's Date: 08/28/2017   History of Present Illness  Pt is a 52 y/o male admitted secondary to AMS and LE swelling. CT negative for acute abnormality. PMH includes polysubstance abuse and alcohol abuse.   Clinical Impression  Pt admitted secondary to problem above with deficits below. Pt presenting with cognitive deficits, and no family present to determine baseline. Pt very unsteady during transfer to chair requiring mod A for steadying assist. Per pt, reports he was using a cane prior to admission. Feel pt is currently a high fall risk and per pt lives alone. Unsure of family support at home. Feel safest d/c location would be SNF to increase independence prior to return home, however, unsure if pt eligible. Will continue to follow acutely to maximize functional mobility independence and safety.     Follow Up Recommendations SNF;Supervision/Assistance - 24 hour    Equipment Recommendations  Rolling walker with 5" wheels;3in1 (PT)    Recommendations for Other Services OT consult     Precautions / Restrictions Precautions Precautions: Fall;Other (comment) Precaution Comments: Safety sitter  Restrictions Weight Bearing Restrictions: No      Mobility  Bed Mobility Overal bed mobility: Needs Assistance Bed Mobility: Supine to Sit     Supine to sit: Supervision     General bed mobility comments: Pt initially asleep, but easily arousable. Supervision for safety.   Transfers Overall transfer level: Needs assistance Equipment used: 1 person hand held assist Transfers: Sit to/from [employer] Sit to Stand: Min assist;Mod assist Stand pivot transfers: Mod assist       General transfer comment: Min A for lift assist and steadying to stand. Once standing, pt unsteady and required mod A for steadying assist. Mod A for steadying throughout transfer. Verbal cues for  sequencing. Once pt in chair, pt going back to sleep again.   Ambulation/Gait             General Gait Details: NT  Stairs            Wheelchair Mobility    Modified Rankin (Stroke Patients Only)       Balance Overall balance assessment: Needs assistance Sitting-balance support: No upper extremity supported;Feet supported Sitting balance-Leahy Scale: Fair Sitting balance - Comments: Attempted to put sock on in sitting, however, pt reporting blurry vision and missed his foot when trying to put sock on.    Standing balance support: Single extremity supported;During functional activity Standing balance-Leahy Scale: Poor Standing balance comment: Very unsteady, requiring UE support and external support.                              Pertinent Vitals/Pain Pain Assessment: No/denies pain    Home Living Family/patient expects to be discharged to:: Private residence Living Arrangements: Alone Available Help at Discharge: Family;Available PRN/intermittently Type of Home: Apartment Home Access: Level entry     Home Layout: One level Home Equipment: Cane - single point Additional Comments: Per NT, thought his family reported that mother lived close by.     Prior Function Level of Independence: Independent with assistive device(s)         Comments: Reports he used cane for ambulation.      Hand Dominance        Extremity/Trunk Assessment   Upper Extremity Assessment Upper Extremity Assessment: Defer to OT evaluation    Lower Extremity Assessment Lower Extremity  Assessment: Generalized weakness    Cervical / Trunk Assessment Cervical / Trunk Assessment: Normal  Communication   Communication: No difficulties  Cognition Arousal/Alertness: (very sleepy initially ) Behavior During Therapy: Flat affect Overall Cognitive Status: No family/caregiver present to determine baseline cognitive functioning                                  General Comments: Pt disoriented to situation and time. Pt also presenting with slowed processing and required multimodal cues to perform mobility tasks.       General Comments General comments (skin integrity, edema, etc.): Pt reporting blurred vision. No family present during session     Exercises     Assessment/Plan    PT Assessment Patient needs continued PT services  PT Problem List Decreased strength;Decreased balance;Decreased mobility;Decreased coordination;Decreased cognition;Decreased knowledge of use of DME;Decreased knowledge of precautions;Decreased safety awareness       PT Treatment Interventions DME instruction;Gait training;Stair training;Functional mobility training;Therapeutic activities;Therapeutic exercise;Cognitive remediation;Patient/family education;Balance training    PT Goals (Current goals can be found in the Care Plan section)  Acute Rehab PT Goals Patient Stated Goal: none stated  PT Goal Formulation: Patient unable to participate in goal setting Time For Goal Achievement: 09/11/17 Potential to Achieve Goals: Fair    Frequency Min 2X/week   Barriers to discharge Decreased caregiver support Reports he lives alone. Unsure of family assist able to be provided.     Co-evaluation               AM-PAC PT "6 Clicks" Daily Activity  Outcome Measure Difficulty turning over in bed (including adjusting bedclothes, sheets and blankets)?: A Little Difficulty moving from lying on back to sitting on the side of the bed? : A Little Difficulty sitting down on and standing up from a chair with arms (e.g., wheelchair, bedside commode, etc,.)?: Unable Help needed moving to and from a bed to chair (including a wheelchair)?: A Lot Help needed walking in hospital room?: A Lot Help needed climbing 3-5 steps with a railing? : Total 6 Click Score: 12    End of Session Equipment Utilized During Treatment: Gait belt Activity Tolerance: Patient tolerated treatment  well Patient left: in chair;with call bell/phone within reach;with nursing/sitter in room Nurse Communication: Mobility status PT Visit Diagnosis: Unsteadiness on feet (R26.81);Other symptoms and signs involving the nervous system (R29.898);Muscle weakness (generalized) (M62.81)    Time: [id number] PT Time Calculation (min) (ACUTE ONLY): 15 min   Charges:   PT Evaluation $PT Eval Moderate Complexity: 1 Mod     PT G Codes:        Gladys DammeBrittany Kaylany Tesoriero, PT, DPT  Acute Rehabilitation Services  Pager: [phone]   Lehman PromBrittany S Mallory Enriques 08/28/2017, 2:04 PM

## 2017-08-28 NOTE — NC FL2 (Signed)
[place] MEDICAID FL2 LEVEL OF CARE SCREENING TOOL     IDENTIFICATION  Patient Name: Theodore Brown Birthdate: 02/05/1966 Sex: male Admission Date (Current Location): 08/27/2017  [facility] [place]   Facility and Address:  [facility], [address], [place], [place] [address]      Provider Number: [id number]  Attending Physician Name and Address:  Burnadette PopAdhikari, Amrit, MD  Relative Name and Phone Number:       Current Level of Care: Hospital Recommended Level of Care: Skilled Nursing Facility Prior Approval Number:    Date Approved/Denied:   PASRR Number:    Discharge Plan: SNF    Current Diagnoses: Patient Active Problem List   Diagnosis Date Noted  . Pancytopenia (HCC) 08/27/2017  . Elevated liver enzymes 08/27/2017  . Polysubstance abuse (HCC) 08/27/2017  . Acute metabolic encephalopathy 08/27/2017  . Acute encephalopathy 08/27/2017  . Peripheral edema 09/16/2016  . Alcohol abuse 09/16/2016  . Marijuana use 09/16/2016  . Bilateral lower extremity edema 09/16/2016  . Arthritis 02/03/2013  . Alcoholic liver disease (HCC) 01/09/2013  . Alcohol withdrawal delirium (HCC) 01/06/2013  . Thrombocytopenia, unspecified (HCC) 01/05/2013  . Salmonella bacteremia 01/05/2013  . Hyponatremia 01/04/2013  . Hypokalemia 01/04/2013  . Alcohol dependence (HCC) 01/04/2013  . Fever 01/04/2013  . Acute pancreatitis 01/04/2013  . SIRS (systemic inflammatory response syndrome) (HCC) 01/04/2013    Orientation RESPIRATION BLADDER Height & Weight     Self, Place  Normal Continent Weight: 140 lb (63.5 kg) Height:  5\' 7"  (170.2 cm)  BEHAVIORAL SYMPTOMS/MOOD NEUROLOGICAL BOWEL NUTRITION STATUS      Continent Diet(Diet 2 gram sodium , thin liquids)  AMBULATORY STATUS COMMUNICATION OF NEEDS Skin   Extensive Assist Verbally Normal                       Personal Care Assistance Level of Assistance  Bathing, Feeding, Dressing  Bathing Assistance: Maximum assistance Feeding assistance: Independent Dressing Assistance: Maximum assistance     Functional Limitations Info  Sight, Hearing, Speech Sight Info: Adequate Hearing Info: Adequate Speech Info: Adequate    SPECIAL CARE FACTORS FREQUENCY  PT (By licensed PT), OT (By licensed OT)     PT Frequency: 2x OT Frequency: 2x            Contractures Contractures Info: Not present    Additional Factors Info  Code Status, Allergies Code Status Info: Full Code Allergies Info: No known allergies           Current Medications (08/28/2017):  This is the current hospital active medication list Current Facility-Administered Medications  Medication Dose Route Frequency Provider Last Rate Last Dose  . folic acid (FOLVITE) tablet 1 mg  1 mg Oral Daily Katrinka BlazingSmith, Rondell A, MD   1 mg at 08/28/17 0104  . hydrALAZINE (APRESOLINE) injection 10 mg  10 mg Intravenous Q4H PRN Smith, Rondell A, MD      . ipratropium-albuterol (DUONEB) 0.5-2.5 (3) MG/3ML nebulizer solution 3 mL  3 mL Nebulization Q6H PRN Smith, Rondell A, MD      . LORazepam (ATIVAN) injection 0-4 mg  0-4 mg Intravenous Q6H Clydie BraunSmith, Rondell A, MD   Stopped at 08/28/17 0444   Followed by  . [START ON 08/29/2017] LORazepam (ATIVAN) injection 0-4 mg  0-4 mg Intravenous Q12H Smith, Rondell A, MD      . LORazepam (ATIVAN) tablet 1 mg  1 mg Oral Q6H PRN Clydie BraunSmith, Rondell A, MD  Or  . LORazepam (ATIVAN) injection 1 mg  1 mg Intravenous Q6H PRN Madelyn Flavors A, MD   1 mg at 08/28/17 0104  . multivitamin with minerals tablet 1 tablet  1 tablet Oral Daily Madelyn Flavors A, MD   1 tablet at 08/28/17 0103  . ondansetron (ZOFRAN) tablet 4 mg  4 mg Oral Q6H PRN Madelyn Flavors A, MD       Or  . ondansetron (ZOFRAN) injection 4 mg  4 mg Intravenous Q6H PRN Smith, Rondell A, MD      . potassium chloride 10 mEq in 100 mL IVPB  10 mEq Intravenous Q1 Hr x 4 Adhikari, Amrit, MD 100 mL/hr at 08/28/17 1633    . potassium  chloride SA (K-DUR,KLOR-CON) CR tablet 40 mEq  40 mEq Oral Once Burnadette Pop, MD      . thiamine 500mg  in normal saline (50ml) IVPB  500 mg Intravenous Q24H Clydie Braun, MD   Stopped at 08/28/17 [phone]     Discharge Medications: Please see discharge summary for a list of discharge medications.  Relevant Imaging Results:  Relevant Lab Results:   Additional Information SSN: [id number]  Maree Krabbe, LCSW

## 2017-08-29 DIAGNOSIS — F1410 Cocaine abuse, uncomplicated: Secondary | ICD-10-CM | POA: Diagnosis present

## 2017-08-29 DIAGNOSIS — D649 Anemia, unspecified: Secondary | ICD-10-CM | POA: Diagnosis present

## 2017-08-29 DIAGNOSIS — R4182 Altered mental status, unspecified: Secondary | ICD-10-CM | POA: Diagnosis not present

## 2017-08-29 DIAGNOSIS — R748 Abnormal levels of other serum enzymes: Secondary | ICD-10-CM | POA: Diagnosis present

## 2017-08-29 DIAGNOSIS — Z79899 Other long term (current) drug therapy: Secondary | ICD-10-CM | POA: Diagnosis not present

## 2017-08-29 DIAGNOSIS — K709 Alcoholic liver disease, unspecified: Secondary | ICD-10-CM | POA: Diagnosis present

## 2017-08-29 DIAGNOSIS — G9341 Metabolic encephalopathy: Secondary | ICD-10-CM | POA: Diagnosis not present

## 2017-08-29 DIAGNOSIS — F1210 Cannabis abuse, uncomplicated: Secondary | ICD-10-CM | POA: Diagnosis present

## 2017-08-29 DIAGNOSIS — E876 Hypokalemia: Secondary | ICD-10-CM | POA: Diagnosis present

## 2017-08-29 DIAGNOSIS — F10239 Alcohol dependence with withdrawal, unspecified: Secondary | ICD-10-CM | POA: Diagnosis not present

## 2017-08-29 DIAGNOSIS — E512 Wernicke's encephalopathy: Secondary | ICD-10-CM | POA: Diagnosis present

## 2017-08-29 DIAGNOSIS — E639 Nutritional deficiency, unspecified: Secondary | ICD-10-CM | POA: Diagnosis present

## 2017-08-29 DIAGNOSIS — E538 Deficiency of other specified B group vitamins: Secondary | ICD-10-CM | POA: Diagnosis present

## 2017-08-29 DIAGNOSIS — M1990 Unspecified osteoarthritis, unspecified site: Secondary | ICD-10-CM | POA: Diagnosis present

## 2017-08-29 DIAGNOSIS — D61818 Other pancytopenia: Secondary | ICD-10-CM | POA: Diagnosis present

## 2017-08-29 DIAGNOSIS — K439 Ventral hernia without obstruction or gangrene: Secondary | ICD-10-CM | POA: Diagnosis present

## 2017-08-29 DIAGNOSIS — F10229 Alcohol dependence with intoxication, unspecified: Secondary | ICD-10-CM | POA: Diagnosis present

## 2017-08-29 NOTE — Clinical Social Work Note (Signed)
PASRR is pending. Pt will be unable to go to SNF without PASRR.   [place], [place] [phone]

## 2017-08-29 NOTE — Progress Notes (Signed)
PROGRESS NOTE    Theodore Brown  ZOX:[id number] DOB: 1966/01/17 DOA: 08/27/2017 PCP: Loletta Specter, PA-C   Brief Narrative: Patient is a 52 year old male with positive for alcohol abuse, alcoholic liver disease, pancytopenia, ventral hernia who presents to the emergency department with altered mental status.  Patient was admitted for further evaluation.  Alcohol intoxication suspected .There was also concern for Wernicke's encephalopathy.  Assessment & Plan:   Principal Problem:   Acute metabolic encephalopathy Active Problems:   Alcoholic liver disease (HCC)   Pancytopenia (HCC)   Elevated liver enzymes   Polysubstance abuse (HCC)   Acute encephalopathy  Acute metabolic encephalopathy:  Could be multifactorial.  Alcohol intoxication suspected but alcohol level undetectable.  Ammonia level normal.  Patient's UDS was also positive for cocaine and marijuana.  Possible Wernicke's encephalopathy versus alcohol withdrawal delirium. Started on thiamine supplementation.Also folic acid. Started on CIWA protocol. Corporate investment banker.  Physical therapy requested.  Recommended skilled nursing facility.  Social worker working on that.  We will also request social worker for outpatient alcohol rehab services on discharge.  Polysubstance abuse: Continues to drink significant amount of alcohol.  UDS positive for cocaine and marijuana.  Pancytopenia: Currently H&H and platelets level stable.  We will continue to monitor .  Elevated Liver enzymes: Chronic.  Alcoholic pattern.  INR/PT normal.  Ventral hernia: Chronic.  Reducible  Hypokalemia: Supplemented.  Will check levels tomorrow.   DVT prophylaxis: SCD Code Status: Full Family Communication: None present at the bedside Disposition Plan: SNF after resolution of alcohol withdrawal/confusion   Consultants: None  Procedures: None  Antimicrobials: None  Subjective: Seen and examined the bedside this morning.  He was given Ativan  after he was agitated before my evaluation.  Appeared to be sleepy.  Objective: Vitals:   08/28/17 2320 08/29/17 0309 08/29/17 0727 08/29/17 1141  BP: 129/67 110/64 103/67 127/85  Pulse: 89 (!) 105 99 93  Resp: Temp: 98.5 F (36.9 C) 98.6 F (37 C) 98.4 F (36.9 C) 98.6 F (37 C)  TempSrc: Oral Oral Oral Oral  SpO2: 100% 100% 100% 100%  Weight:      Height:        Intake/Output Summary (Last 24 hours) at 08/29/2017 1334 Last data filed at 08/29/2017 0900 Gross per 24 hour  Intake 300 ml  Output 300 ml  Net 0 ml   Filed Weights   08/27/17 1248  Weight: 63.5 kg (140 lb)    Examination:  General exam: Appears calm and comfortable ,Not in distress,average built,sleeping HEENT:PERRL,Oral mucosa moist, Ear/Nose normal on gross exam Respiratory system: Bilateral equal air entry, normal vesicular breath sounds, no wheezes or crackles  Cardiovascular system: S1 & S2 heard, RRR. No JVD, murmurs, rubs, gallops or clicks. No pedal edema. Gastrointestinal system: Abdomen is nondistended, soft and nontender. No organomegaly or masses felt. Normal bowel sounds heard.ventral hernia Central nervous system: Alert and oriented. No focal neurological deficits. Extremities: No edema, no clubbing ,no cyanosis, distal peripheral pulses palpable. Skin: No rashes, lesions or ulcers,no icterus ,no pallor   Data Reviewed: I have personally reviewed following labs and imaging studies  CBC: Recent Labs  Lab 08/27/17 1249 08/28/17 0610  WBC 4.6 4.1  HGB 9.0* 8.4*  HCT 28.1* 26.1*  MCV 85.4 86.4  PLT 65* 55*   Basic Metabolic Panel: Recent Labs  Lab 08/27/17 1249 08/28/17 0610 08/28/17 1156  NA 136 139 137  K 3.8 3.3* 3.5  CL 105 108 106  CO2 22 22 20*  GLUCOSE 97 69 86  BUN CREATININE 1.00 1.03 0.93  CALCIUM 9.3 8.6* 8.7*   GFR: Estimated Creatinine Clearance: 85.3 mL/min (by C-G formula based on SCr of 0.93 mg/dL). Liver Function Tests: Recent Labs    Lab 08/27/17 1249 08/28/17 0610 08/28/17 1156  AST 86* 80* 77*  ALT 35 30 31  ALKPHOS 161* 135* 133*  BILITOT 3.1* 2.9* 2.7*  PROT 8.0 6.8 7.0  ALBUMIN 2.7* 2.4* 2.4*   No results for input(s): LIPASE, AMYLASE in the last 168 hours. Recent Labs  Lab 08/27/17 1735  AMMONIA 26   Coagulation Profile: Recent Labs  Lab 08/27/17 2322  INR 1.38   Cardiac Enzymes: Recent Labs  Lab 08/28/17 0013 08/28/17 0610 08/28/17 1156  TROPONINI <0.03 <0.03 <0.03   BNP (last 3 results) No results for input(s): PROBNP in the last 8760 hours. HbA1C: No results for input(s): HGBA1C in the last 72 hours. CBG: Recent Labs  Lab 08/27/17 1305  GLUCAP 93   Lipid Profile: No results for input(s): CHOL, HDL, LDLCALC, TRIG, CHOLHDL, LDLDIRECT in the last 72 hours. Thyroid Function Tests: Recent Labs    08/27/17 2322  TSH 1.432   Anemia Panel: No results for input(s): VITAMINB12, FOLATE, FERRITIN, TIBC, IRON, RETICCTPCT in the last 72 hours. Sepsis Labs: No results for input(s): PROCALCITON, LATICACIDVEN in the last 168 hours.  No results found for this or any previous visit (from the past 240 hour(s)).       Radiology Studies: Ct Head Wo Contrast  Result Date: 08/27/2017 CLINICAL DATA:  Altered mental status.  Hypertension. EXAM: CT HEAD WITHOUT CONTRAST TECHNIQUE: Contiguous axial images were obtained from the base of the skull through the vertex without intravenous contrast. COMPARISON:  01/04/2013 FINDINGS: Brain: Generalized brain atrophy. Mild chronic small-vessel ischemic changes of the pons, basal ganglia and hemispheric white matter. No large vessel territory infarction. No sign of acute infarction. No mass lesion, hemorrhage, hydrocephalus or extra-axial collection. Vascular: There is atherosclerotic calcification of the major vessels at the base of the brain. Skull: Negative Sinuses/Orbits: Sinuses are clear. Old right orbital floor/inferior lamina papyracea fracture. Old  appearing nasal fractures. Other: None significant IMPRESSION: No acute finding.  Atrophy and chronic small-vessel ischemic change. Old right orbital and nasal fractures. Electronically Signed   By: Paulina Fusi M.D.   On: 08/27/2017 13:56        Scheduled Meds: . folic acid  1 mg Oral Daily  . LORazepam  0-4 mg Intravenous Q6H   Followed by  . LORazepam  0-4 mg Intravenous Q12H  . multivitamin with minerals  1 tablet Oral Daily  . potassium chloride  40 mEq Oral Once   Continuous Infusions: . thiamine injection 0 mg (08/28/17 0417)     LOS: 0 days    Time spent: 25  mins.More than 50% of that time was spent in counseling and/or coordination of care.      Burnadette Pop, MD [facility] Pager [phone]  If 7PM-7AM, please contact night-coverage [email or web address] Password [email or web address] 08/29/2017, 1:34 PM

## 2017-08-29 NOTE — Progress Notes (Signed)
OT Cancellation Note  Patient Details Name: Theodore Brown MRN: [id number] DOB: 1965-11-17   Cancelled Treatment:    Reason Eval/Treat Not Completed: Fatigue/lethargy limiting ability to participate. Pt sleeping heavily and nurse tech reports that pt recently went to sleep and did not have a good night, did not sleep, agitated and combative with staff. Pt now in restraints and sleeping. OT Will re attempt at next available/apprropriate time  Galen Manila 08/29/2017, 11:04 AM

## 2017-08-29 NOTE — Progress Notes (Signed)
Medication history technician attempted to follow up on patient's PTA medications- cannot find any additional information regarding patient's home meds.   We have talked to the pt, called his Emergency Contact, and called all the pharmacies he has given [place] with no additional information discovered.

## 2017-08-30 LAB — BASIC METABOLIC PANEL
Anion gap: 5 (ref 5–15)
BUN: 7 mg/dL (ref 6–20)
CO2: 24 mmol/L (ref 22–32)
CREATININE: 0.95 mg/dL (ref 0.61–1.24)
Calcium: 8.7 mg/dL — ABNORMAL LOW (ref 8.9–10.3)
Chloride: 104 mmol/L (ref 101–111)
GFR calc Af Amer: >60 mL/min (ref >60)
Glucose, Bld: 123 mg/dL — ABNORMAL HIGH (ref 65–99)
Potassium: 3.9 mmol/L (ref 3.5–5.1)
SODIUM: 133 mmol/L — AB (ref 135–145)

## 2017-08-30 MED ORDER — POTASSIUM CHLORIDE CRYS ER 20 MEQ PO TBCR
20.0000 meq | EXTENDED_RELEASE_TABLET | Freq: Every day | ORAL | Status: DC
  Administered 2017-08-31 – 2017-09-01 (×2): 20 meq via ORAL
  Filled 2017-08-30 (×2): qty 1

## 2017-08-30 NOTE — Clinical Social Work Note (Signed)
Clinical Social Work Assessment  Patient Details  Name: Theodore Brown MRN: [id number] Date of Birth: November 27, 1965  Date of referral:  08/30/17               Reason for consult:  Facility Placement                Permission sought to share information with:  Family Supports Permission granted to share information::     Name::     Sydnee Cabal   Agency::     Relationship::  (sister)   Contact Information:  [phone]  Housing/Transportation Living arrangements for the past 2 months:  Single Family Home Source of Information:  Siblings Patient Interpreter Needed:  None Criminal Activity/Legal Involvement Pertinent to Current Situation/Hospitalization:  No - Comment as needed Significant Relationships:  Siblings, Parents Lives with:  Self Do you feel safe going back to the place where you live?  No Need for family participation in patient care:  No (Coment)  Care giving concerns:  Pt is disoriented x4. CSW spoke with pt's sister via telephone.   Social Worker assessment / plan:  CSW spoke with pt's sister. Pt's sister states she has been out of the loop and had no idea of PT recommendation. CSW explained the SNF process. Pt's sister wants pt to go to the rehab in house. CSW explained that is a referral based rehab and the MD would have referred the pt. Pt's sister wants to talk to MD. CSW txt paged MD to contact pt's sister. CSW will provide pt's sister with a list of facilities when she arrives at the hopsital.  Employment status:    Insurance information:  Medicaid In [place] PT Recommendations:  Skilled Nursing Facility Information / Referral to community resources:  Skilled Nursing Facility  Patient/Family's Response to care:  Pt's sister inquiring about pts altered mental status in conjunction with his substance abuse. Pt wants to speak with MD about pt's recent decline.  Patient/Family's Understanding of and Emotional Response to Diagnosis, Current Treatment, and  Prognosis:  Pt's sister understands pt's physical limitations however, pt sister wants to talk to MD about treatment plan. CSW to follow up with sister at bedside later today, when she arrives.  Emotional Assessment Appearance:  Appears stated age Attitude/Demeanor/Rapport:  Unable to Assess Affect (typically observed):  Unable to Assess Orientation:  (Disoriented x4) Alcohol / Substance use:  Alcohol Use Psych involvement (Current and /or in the community):  No (Comment)  Discharge Needs  Concerns to be addressed:  Basic Needs, Care Coordination Readmission within the last 30 days:  No Current discharge risk:  Dependent with Mobility Barriers to Discharge:  Continued Medical Work up, Active Substance Use   Nosson Wender A Bowen Goyal, LCSW 08/30/2017, 1:16 PM

## 2017-08-30 NOTE — Evaluation (Signed)
Occupational Therapy Evaluation Patient Details Name: Theodore Brown MRN: [id number] DOB: [birth year] Today's Date: 08/30/2017    History of Present Illness Pt is a 52 y/o male admitted secondary to AMS and LE swelling. CT negative for acute abnormality. PMH includes polysubstance abuse and alcohol abuse.    Clinical Impression   Pt with lethargy, likely from medication. Presents with impaired cognition and is very unsteady in standing, requiring moderate assistance. He is able to self feed with set up and requires min to total assist for bathing, dressing and toileting. Pt lives alone and will need 24 hour care upon discharge, recommending SNF. Will follow acutely.    Follow Up Recommendations  SNF;Supervision/Assistance - 24 hour    Equipment Recommendations       Recommendations for Other Services       Precautions / Restrictions Precautions Precautions: Fall Restrictions Weight Bearing Restrictions: No      Mobility Bed Mobility Overal bed mobility: Needs Assistance Bed Mobility: Supine to Sit     Supine to sit: Min guard     General bed mobility comments: min guard, increased time, inefficient  Transfers Overall transfer level: Needs assistance Equipment used: 1 person hand held assist Transfers: Sit to/from [employer] Sit to Stand: Mod assist Stand pivot transfers: Mod assist       General transfer comment: assist to rise and steady    Balance Overall balance assessment: Needs assistance   Sitting balance-Leahy Scale: Fair       Standing balance-Leahy Scale: Poor Standing balance comment: Very unsteady, requiring UE support and external support.                            ADL either performed or assessed with clinical judgement   ADL Overall ADL's : Needs assistance/impaired Eating/Feeding: Minimal assistance;Bed level   Grooming: Wash/dry hands;Bed level;Minimal assistance   Upper Body Bathing: Maximal  assistance;Sitting   Lower Body Bathing: Total assistance;Sit to/from stand   Upper Body Dressing : Sitting;Moderate assistance   Lower Body Dressing: Sit to/from stand;Total assistance   Toilet Transfer: Moderate assistance;Stand-pivot   Toileting- Clothing Manipulation and Hygiene: Total assistance;Sit to/from stand               Vision Baseline Vision/History: No visual deficits Patient Visual Report: No change from baseline       Perception     Praxis      Pertinent Vitals/Pain Pain Assessment: No/denies pain     Hand Dominance Right   Extremity/Trunk Assessment Upper Extremity Assessment Upper Extremity Assessment: Overall WFL for tasks assessed   Lower Extremity Assessment Lower Extremity Assessment: Defer to PT evaluation   Cervical / Trunk Assessment Cervical / Trunk Assessment: Normal   Communication Communication Communication: Expressive difficulties(difficult to understand at times)   Cognition Arousal/Alertness: Lethargic;Suspect due to medications Behavior During Therapy: Flat affect Overall Cognitive Status: Impaired/Different from baseline Area of Impairment: Orientation;Attention;Following commands;Memory;Safety/judgement;Problem solving                 Orientation Level: Disoriented to;Time;Situation Current Attention Level: Focused Memory: Decreased short-term memory Following Commands: Follows one step commands with increased time;Follows one step commands inconsistently Safety/Judgement: Decreased awareness of safety;Decreased awareness of deficits   Problem Solving: Slow processing;Decreased initiation;Difficulty sequencing;Requires verbal cues General Comments: pt given ativan last night   General Comments       Exercises     Shoulder Instructions      Home Living Family/patient  expects to be discharged to:: Private residence Living Arrangements: Alone Available Help at Discharge: Family;Available  PRN/intermittently Type of Home: Apartment Home Access: Level entry     Home Layout: One level     Bathroom Shower/Tub: Chief Strategy Officer: Standard     Home Equipment: Cane - single point          Prior Functioning/Environment Level of Independence: Independent with assistive device(s)        Comments: Reports he used cane for ambulation.         OT Problem List: Impaired balance (sitting and/or standing);Decreased coordination;Decreased cognition;Decreased safety awareness;Decreased knowledge of use of DME or AE      OT Treatment/Interventions: Self-care/ADL training;DME and/or AE instruction;Therapeutic activities;Cognitive remediation/compensation;Patient/family education;Balance training    OT Goals(Current goals can be found in the care plan section) Acute Rehab OT Goals Patient Stated Goal: none stated  OT Goal Formulation: Patient unable to participate in goal setting Time For Goal Achievement: 09/13/17 Potential to Achieve Goals: Good ADL Goals Pt Will Perform Grooming: standing;with min guard assist Pt Will Perform Upper Body Dressing: with supervision;sitting Pt Will Perform Lower Body Dressing: with min guard assist;sit to/from stand Pt Will Transfer to Toilet: with min assist;ambulating Pt Will Perform Toileting - Clothing Manipulation and hygiene: with min guard assist;sit to/from stand Additional ADL Goal #1: Pt will follow one step commands without repetition 75% of time.  OT Frequency: Min 2X/week   Barriers to D/C: Decreased caregiver support          Co-evaluation              AM-PAC PT "6 Clicks" Daily Activity     Outcome Measure Help from another person eating meals?: A Little Help from another person taking care of personal grooming?: A Lot Help from another person toileting, which includes using toliet, bedpan, or urinal?: Total Help from another person bathing (including washing, rinsing, drying)?: A Lot Help  from another person to put on and taking off regular upper body clothing?: A Lot Help from another person to put on and taking off regular lower body clothing?: Total 6 Click Score: 11   End of Session Equipment Utilized During Treatment: Gait belt  Activity Tolerance: Patient limited by lethargy Patient left: in bed;with call bell/phone within reach;with bed alarm set;with nursing/sitter in room;with restraints reapplied  OT Visit Diagnosis: Unsteadiness on feet (R26.81);Other abnormalities of gait and mobility (R26.89);Other symptoms and signs involving cognitive function;History of falling (Z91.81)                Time: 4098-1191 OT Time Calculation (min): 16 min Charges:  OT General Charges $OT Visit: 1 Visit OT Evaluation $OT Eval Moderate Complexity: 1 Mod G-Codes:     Evern Bio 08/30/2017, 11:53 AM  08/30/2017 Martie Round, OTR/L Pager: [phone]

## 2017-08-30 NOTE — Progress Notes (Signed)
PROGRESS NOTE    Theodore Brown  ZOX:[id number] DOB: Dec 29, 1965 DOA: 08/27/2017 PCP: Loletta Specter, PA-C   Brief Narrative: Patient is a 52 year old male with positive for alcohol abuse, alcoholic liver disease, pancytopenia, ventral hernia who presents to the emergency department with altered mental status.  Patient was admitted for further evaluation.  Alcohol intoxication suspected .There was also concern for Wernicke's encephalopathy.  Assessment & Plan:   Principal Problem:   Acute metabolic encephalopathy Active Problems:   Alcoholic liver disease (HCC)   Pancytopenia (HCC)   Elevated liver enzymes   Polysubstance abuse (HCC)   Acute encephalopathy  Acute metabolic encephalopathy:  Could be multifactorial.  Alcohol intoxication suspected but alcohol level undetectable.  Ammonia level normal.  Patient's UDS was also positive for cocaine and marijuana.  Possible Wernicke's encephalopathy versus alcohol withdrawal delirium. Started on thiamine supplementation.Also folic acid. Started on CIWA protocol. Corporate investment banker.  Physical therapy requested.  Recommended skilled nursing facility.  Social worker working on that.  We will also request social worker for outpatient alcohol rehab services on discharge. Mental status has improved today.  He is less agitated.  Polysubstance abuse: Continues to drink significant amount of alcohol.  UDS positive for cocaine and marijuana.  Pancytopenia: Currently H&H and platelets level stable.  We will continue to monitor .  Elevated Liver enzymes: Chronic.  Alcoholic pattern.  INR/PT normal.  Ventral hernia: Chronic.  Reducible  Hypokalemia: Supplemented and corrected.   DVT prophylaxis: SCD Code Status: Full Family Communication: None present at the bedside Disposition Plan: SNF as soon as the bed is available   Consultants: None  Procedures: None  Antimicrobials: None  Subjective: Patient seen and examined the bedside  this morning.  Remains comfortable.  Less agitated.  Alert and oriented. Encouraged to ambulate.  Objective: Vitals:   08/29/17 2358 08/30/17 0404 08/30/17 0826 08/30/17 1202  BP: 130/75 124/81 120/61   Pulse: 96 100 90   Resp: Temp: 98.1 F (36.7 C) 97.7 F (36.5 C)  98.6 F (37 C)  TempSrc: Oral Oral  Oral  SpO2: 100% 100%    Weight:      Height:        Intake/Output Summary (Last 24 hours) at 08/30/2017 1203 Last data filed at 08/30/2017 0847 Gross per 24 hour  Intake 260 ml  Output -  Net 260 ml   Filed Weights   08/27/17 1248  Weight: 63.5 kg (140 lb)    Examination:  General exam: Appears calm and comfortable ,Not in distress,average built HEENT:PERRL,Oral mucosa moist, Ear/Nose normal on gross exam Respiratory system: Bilateral equal air entry, normal vesicular breath sounds, no wheezes or crackles  Cardiovascular system: S1 & S2 heard, RRR. No JVD, murmurs, rubs, gallops or clicks. Gastrointestinal system: Abdomen is nondistended, soft and nontender. No organomegaly or masses felt. Normal bowel sounds heard.Ventral hernia Central nervous system: Alert and oriented. No focal neurological deficits. Extremities: No edema, no clubbing ,no cyanosis, distal peripheral pulses palpable. Skin: No rashes, lesions or ulcers,no icterus ,no pallor  Data Reviewed: I have personally reviewed following labs and imaging studies  CBC: Recent Labs  Lab 08/27/17 1249 08/28/17 0610  WBC 4.6 4.1  HGB 9.0* 8.4*  HCT 28.1* 26.1*  MCV 85.4 86.4  PLT 65* 55*   Basic Metabolic Panel: Recent Labs  Lab 08/27/17 1249 08/28/17 0610 08/28/17 1156 08/30/17 0906  NA 136 139 137 133*  K 3.8 3.3* 3.5 3.9  CL 105 108 106  104  CO2 22 22 20* 24  GLUCOSE 97 69 86 123*  BUN CREATININE 1.00 1.03 0.93 0.95  CALCIUM 9.3 8.6* 8.7* 8.7*   GFR: Estimated Creatinine Clearance: 83.6 mL/min (by C-G formula based on SCr of 0.95 mg/dL). Liver Function Tests: Recent  Labs  Lab 08/27/17 1249 08/28/17 0610 08/28/17 1156  AST 86* 80* 77*  ALT 35 30 31  ALKPHOS 161* 135* 133*  BILITOT 3.1* 2.9* 2.7*  PROT 8.0 6.8 7.0  ALBUMIN 2.7* 2.4* 2.4*   No results for input(s): LIPASE, AMYLASE in the last 168 hours. Recent Labs  Lab 08/27/17 1735  AMMONIA 26   Coagulation Profile: Recent Labs  Lab 08/27/17 2322  INR 1.38   Cardiac Enzymes: Recent Labs  Lab 08/28/17 0013 08/28/17 0610 08/28/17 1156  TROPONINI <0.03 <0.03 <0.03   BNP (last 3 results) No results for input(s): PROBNP in the last 8760 hours. HbA1C: No results for input(s): HGBA1C in the last 72 hours. CBG: Recent Labs  Lab 08/27/17 1305  GLUCAP 93   Lipid Profile: No results for input(s): CHOL, HDL, LDLCALC, TRIG, CHOLHDL, LDLDIRECT in the last 72 hours. Thyroid Function Tests: Recent Labs    08/27/17 2322  TSH 1.432   Anemia Panel: No results for input(s): VITAMINB12, FOLATE, FERRITIN, TIBC, IRON, RETICCTPCT in the last 72 hours. Sepsis Labs: No results for input(s): PROCALCITON, LATICACIDVEN in the last 168 hours.  No results found for this or any previous visit (from the past 240 hour(s)).       Radiology Studies: No results found.      Scheduled Meds: . folic acid  1 mg Oral Daily  . LORazepam  0-4 mg Intravenous Q12H  . multivitamin with minerals  1 tablet Oral Daily  . potassium chloride  40 mEq Oral Once   Continuous Infusions: . thiamine injection 0 mg (08/28/17 0417)     LOS: 1 day    Time spent: 25  mins.More than 50% of that time was spent in counseling and/or coordination of care.      Burnadette Pop, MD [facility] Pager [phone]  If 7PM-7AM, please contact night-coverage [email or web address] Password [facility] 08/30/2017, 12:03 PM

## 2017-08-31 MED ORDER — VITAMIN B-1 100 MG PO TABS
100.0000 mg | ORAL_TABLET | Freq: Every day | ORAL | Status: DC
  Administered 2017-08-31 – 2017-09-01 (×2): 100 mg via ORAL
  Filled 2017-08-31 (×2): qty 1

## 2017-08-31 NOTE — Progress Notes (Signed)
PROGRESS NOTE    Theodore Brown  ZOX:[id number] DOB: [birth year] DOA: 08/27/2017 PCP: Loletta Specter, PA-C   Brief Narrative: Patient is a 52 year old male with positive for alcohol abuse, alcoholic liver disease, pancytopenia, ventral hernia who presents to the emergency department with altered mental status.  Patient was admitted for further evaluation .patient has history of chronic alcohol abuse and has been admitted with same  scenario in the past .UDS was also positive for cocaine and marijuana .There was  concern for Wernicke's encephalopathy during presentation.  His hospital course was also remarkable for alcohol withdrawal.  Patient was initially treated with high-dose thiamine.  Currently his mental status is stable.  He  has been evaluated by physical therapy and recommended a skilled nursing facility on discharge.  Assessment & Plan:   Principal Problem:   Acute metabolic encephalopathy Active Problems:   Alcoholic liver disease (HCC)   Pancytopenia (HCC)   Elevated liver enzymes   Polysubstance abuse (HCC)   Acute encephalopathy  Acute metabolic encephalopathy:  Could be multifactorial.  Alcohol intoxication suspected but alcohol level undetectable.  Ammonia level normal.  Patient's UDS was also positive for cocaine and marijuana.  Possible Wernicke's encephalopathy versus alcohol withdrawal delirium. Started on thiamine supplementation.Also folic acid. Started on CIWA protocol.  Physical therapy requested.  Recommended skilled nursing facility.  Social worker working on that.  We will also request social worker for outpatient alcohol rehab services on discharge. Mental status has improved today.  He is not agitated.  Polysubstance abuse: Continues to drink significant amount of alcohol.  UDS positive for cocaine and marijuana.  Pancytopenia: Currently H&H and platelets level stable.  We will continue to monitor .  We will check FOBT, iron panel.  Most likely this is  associated with chronic alcohol abuse, nutritional deficiency of folic acid .  Elevated Liver enzymes: Chronic.  Alcoholic pattern.  INR/PT normal.  Ventral hernia: Chronic.  Reducible  Hypokalemia: Supplemented and corrected.  Patient is medically stable to be discharged to skilled nursing facility as soon as the bed is available.  DVT prophylaxis: SCD Code Status: Full Family Communication: Discussed with her daughter a few days ago. Disposition Plan: SNF as soon as the bed is available   Consultants: None  Procedures: None  Antimicrobials: None  Subjective: Patient seen and examined the bedside this morning.  Remains comfortable.  Not agitated.  Alert and oriented. Sitter discontinued  Objective: Vitals:   08/31/17 0035 08/31/17 0341 08/31/17 0800 08/31/17 1200  BP: 102/61 98/68 139/89 102/66  Pulse: (!) 105 (!) 101 (!) 101 (!) 110  Resp: Temp: 98 F (36.7 C) 97.8 F (36.6 C) 98.4 F (36.9 C) 98.3 F (36.8 C)  TempSrc: Oral Oral Oral Oral  SpO2: 100% 100% 100% 100%  Weight:      Height:        Intake/Output Summary (Last 24 hours) at 08/31/2017 1250 Last data filed at 08/31/2017 1030 Gross per 24 hour  Intake 670 ml  Output 1800 ml  Net -1130 ml   Filed Weights   08/27/17 1248  Weight: 63.5 kg (140 lb)    Examination:  General exam: Appears calm and comfortable ,Not in distress,average built HEENT:PERRL,Oral mucosa moist, Ear/Nose normal on gross exam Respiratory system: Bilateral equal air entry, normal vesicular breath sounds, no wheezes or crackles  Cardiovascular system: S1 & S2 heard, RRR. No JVD, murmurs, rubs, gallops or clicks. Gastrointestinal system: Abdomen is nondistended, soft and nontender. No  organomegaly or masses felt. Normal bowel sounds heard.ventral hernia Central nervous system: Alert and oriented. No focal neurological deficits. Extremities: No edema, no clubbing ,no cyanosis, distal peripheral pulses palpable. Skin:  No rashes, lesions or ulcers,no icterus ,no pallor    Data Reviewed: I have personally reviewed following labs and imaging studies  CBC: Recent Labs  Lab 08/27/17 1249 08/28/17 0610  WBC 4.6 4.1  HGB 9.0* 8.4*  HCT 28.1* 26.1*  MCV 85.4 86.4  PLT 65* 55*   Basic Metabolic Panel: Recent Labs  Lab 08/27/17 1249 08/28/17 0610 08/28/17 1156 08/30/17 0906  NA 136 139 137 133*  K 3.8 3.3* 3.5 3.9  CL 105 108 106 104  CO2 22 22 20* 24  GLUCOSE 97 69 86 123*  BUN CREATININE 1.00 1.03 0.93 0.95  CALCIUM 9.3 8.6* 8.7* 8.7*   GFR: Estimated Creatinine Clearance: 83.6 mL/min (by C-G formula based on SCr of 0.95 mg/dL). Liver Function Tests: Recent Labs  Lab 08/27/17 1249 08/28/17 0610 08/28/17 1156  AST 86* 80* 77*  ALT 35 30 31  ALKPHOS 161* 135* 133*  BILITOT 3.1* 2.9* 2.7*  PROT 8.0 6.8 7.0  ALBUMIN 2.7* 2.4* 2.4*   No results for input(s): LIPASE, AMYLASE in the last 168 hours. Recent Labs  Lab 08/27/17 1735  AMMONIA 26   Coagulation Profile: Recent Labs  Lab 08/27/17 2322  INR 1.38   Cardiac Enzymes: Recent Labs  Lab 08/28/17 0013 08/28/17 0610 08/28/17 1156  TROPONINI <0.03 <0.03 <0.03   BNP (last 3 results) No results for input(s): PROBNP in the last 8760 hours. HbA1C: No results for input(s): HGBA1C in the last 72 hours. CBG: Recent Labs  Lab 08/27/17 1305  GLUCAP 93   Lipid Profile: No results for input(s): CHOL, HDL, LDLCALC, TRIG, CHOLHDL, LDLDIRECT in the last 72 hours. Thyroid Function Tests: No results for input(s): TSH, T4TOTAL, FREET4, T3FREE, THYROIDAB in the last 72 hours. Anemia Panel: No results for input(s): VITAMINB12, FOLATE, FERRITIN, TIBC, IRON, RETICCTPCT in the last 72 hours. Sepsis Labs: No results for input(s): PROCALCITON, LATICACIDVEN in the last 168 hours.  No results found for this or any previous visit (from the past 240 hour(s)).       Radiology Studies: No results  found.      Scheduled Meds: . folic acid  1 mg Oral Daily  . LORazepam  0-4 mg Intravenous Q12H  . multivitamin with minerals  1 tablet Oral Daily  . potassium chloride  20 mEq Oral Daily  . thiamine  100 mg Oral Daily   Continuous Infusions:    LOS: 2 days    Time spent: 25  mins.More than 50% of that time was spent in counseling and/or coordination of care.      Burnadette Pop, MD [facility] Pager [phone]  If 7PM-7AM, please contact night-coverage [email or web address] Password [email or web address] 08/31/2017, 12:50 PM

## 2017-09-01 LAB — CBC WITH DIFFERENTIAL/PLATELET
BASOS ABS: 0 10*3/uL (ref 0.0–0.1)
BASOS PCT: 0 %
EOS ABS: 0.1 10*3/uL (ref 0.0–0.7)
Eosinophils Relative: 2 %
HCT: 29.6 % — ABNORMAL LOW (ref 39.0–52.0)
HEMOGLOBIN: 9.3 g/dL — AB (ref 13.0–17.0)
Lymphocytes Relative: 36 %
Lymphs Abs: 1.9 10*3/uL (ref 0.7–4.0)
MCH: 27.5 pg (ref 26.0–34.0)
MCHC: 31.4 g/dL (ref 30.0–36.0)
MCV: 87.6 fL (ref 78.0–100.0)
Monocytes Absolute: 0.8 10*3/uL (ref 0.1–1.0)
Monocytes Relative: 16 %
NEUTROS PCT: 46 %
Neutro Abs: 2.3 10*3/uL (ref 1.7–7.7)
Platelets: 63 10*3/uL — ABNORMAL LOW (ref 150–400)
RBC: 3.38 MIL/uL — AB (ref 4.22–5.81)
RDW: 20.2 % — ABNORMAL HIGH (ref 11.5–15.5)
WBC: 5.1 10*3/uL (ref 4.0–10.5)

## 2017-09-01 LAB — IRON AND TIBC
Iron: 23 ug/dL — ABNORMAL LOW (ref 45–182)
Saturation Ratios: 7 % — ABNORMAL LOW (ref 17.9–39.5)
TIBC: 332 ug/dL (ref 250–450)
UIBC: 309 ug/dL

## 2017-09-01 LAB — OCCULT BLOOD X 1 CARD TO LAB, STOOL: Fecal Occult Bld: NEGATIVE

## 2017-09-01 LAB — GLUCOSE, CAPILLARY: GLUCOSE-CAPILLARY: 141 mg/dL — AB (ref 65–99)

## 2017-09-01 LAB — FERRITIN: Ferritin: 23 ng/mL — ABNORMAL LOW (ref 24–336)

## 2017-09-01 MED ORDER — THIAMINE HCL 100 MG PO TABS: 100.0000 mg | ORAL_TABLET | Freq: Every day | ORAL | 0 refills | Status: DC

## 2017-09-01 MED ORDER — FERROUS SULFATE 325 (65 FE) MG PO TABS: 325.0000 mg | ORAL_TABLET | Freq: Two times a day (BID) | ORAL | 0 refills | Status: DC

## 2017-09-01 MED ORDER — FERROUS SULFATE 325 (65 FE) MG PO TABS: 325.0000 mg | ORAL_TABLET | Freq: Every day | ORAL | Status: DC

## 2017-09-01 MED ORDER — FOLIC ACID 5 MG PO CAPS: 1.0000 | ORAL_CAPSULE | Freq: Every day | ORAL | 0 refills | Status: DC

## 2017-09-01 NOTE — Care Management Note (Signed)
Case Management Note  Patient Details  Name: Theodore Brown MRN: [id number] Date of Birth: 03-Dec-1965  Subjective/Objective:                    Action/Plan: Pt discharging home with [facility] services. CM spoke to patient's sister, Theodore Brown, and informed her of discharge and of HH choices. She selected [facility]. Lupita Leash with [facility] notified and accepted the referral.  Pt is without power. CM provided the patients mother information on groups that assist with utilities. CM inquired with Theodore Brown if patient could go to one of the family's homes. She is asking her mother if patient can stay with her until utilities are back on.  Family to provide transportation home.    Expected Discharge Date:  09/01/17               Expected Discharge Plan:  Home/Self Care  In-House Referral:  Clinical Social Work  Discharge planning Services  CM Consult  Post Acute Care Choice:  Home Health Choice offered to:  Patient, Sibling, Parent  DME Arranged:    DME Agency:     HH Arranged:  RN, Social Work [employer] Agency:  [facility]  Status of Service:  Completed, signed off  If discussed at [employer], dates discussed:    Additional Comments:  Kermit Balo, RN 09/01/2017, 4:31 PM

## 2017-09-01 NOTE — Discharge Summary (Signed)
Physician Discharge Summary  Theodore Brown:[id number] DOB: 01-Nov-1965 DOA: 08/27/2017  PCP: Loletta Specter, PA-C  Admit date: 08/27/2017 Discharge date: 09/01/2017  Admitted From: Home Disposition:  Home  Discharge Condition:Stable CODE STATUS:FULL Diet recommendation: Regular  Brief/Interim Summary: Patient is a 52 year old male with positive for alcohol abuse, alcoholic liver disease, pancytopenia, ventral hernia who presents to the emergency department with altered mental status.  Patient was admitted for further evaluation .Patient has history of chronic alcohol abuse and has been admitted with same  scenario in the past .UDS was also positive for cocaine and marijuana .There was  concern for Wernicke's encephalopathy during presentation.  His hospital course was also remarkable for alcohol withdrawal.  Patient was initially treated with high-dose thiamine.  Currently his mental status is stable.  He is alert and oriented. Patient was evaluated by physical therapy and recommended a skilled nursing facility on discharge initially but on reevaluation he did very well and he does not need skilled nursing facility.  Patient also denies to go to skilled nursing facility. Social worker consulted for alcohol rehabilitation assistance.  Patient has been counseled several times regarding cessation of alcohol and polysubstance abuse. Patient is stable for discharge to home today.  He should continue on thiamine and folic acid on discharge.  Following problems were addressed during his hospitalization:  Acute metabolic encephalopathy:  Could be multifactorial.  Alcohol intoxication suspected but alcohol level undetectable.  Ammonia level normal.  Patient's UDS was also positive for cocaine and marijuana. Possible Wernicke's encephalopathy versus alcohol withdrawal delirium. Started on high  thiamine supplementation.Also on folic acid. Started on CIWA protocol. We have requested social  worker for outpatient alcohol rehab services on discharge. Mental status is currently stable.  He is alert and oriented x4.  Chronic alcohol abuse: Patient has been admitted in the past with similar scenario.  He has been counseled several times regarding alcohol cessation.  Continue thiamine and folic acid on discharge.  Polysubstance abuse:  UDS positive for cocaine and marijuana.  Advised for cessation.  Pancytopenia: History of chronic anemia and thrombocytopenia. Checked  iron panel.   Low iron and ferritin. Most likely this is associated with chronic alcohol abuse, nutritional deficiency.  Continue folic acid, iron supplementation.  Elevated Liver enzymes: Chronic.  Alcoholic pattern.  INR/PT normal.  Ventral hernia: Chronic.  Reducible.  No abdominal pain  Hypokalemia: Supplemented and corrected.    Discharge Diagnoses:  Principal Problem:   Acute metabolic encephalopathy Active Problems:   Alcoholic liver disease (HCC)   Pancytopenia (HCC)   Elevated liver enzymes   Polysubstance abuse (HCC)   Acute encephalopathy    Discharge Instructions  Discharge Instructions    Diet - low sodium heart healthy   Complete by:  As directed    Discharge instructions   Complete by:  As directed    1) Pleas stop consuming alcohol. 2)Follow up with your PCP in a week.Check CBC and BMP during the follow-up. 3)Take prescribed medications as instructed.   Increase activity slowly   Complete by:  As directed      Allergies as of 09/01/2017   No Known Allergies     Medication List    STOP taking these medications   bacitracin 500 UNIT/GM ointment   Diclofenac Sodium 3 % Crea   furosemide 40 MG tablet Commonly known as:  LASIX   gabapentin 300 MG capsule Commonly known as:  NEURONTIN   LORazepam 2 MG tablet Commonly known as:  ATIVAN  TAKE these medications   disulfiram 250 MG tablet Commonly known as:  ANTABUSE Take 1 tablet (250 mg total) by mouth daily.    ferrous sulfate 325 (65 FE) MG tablet Take 1 tablet (325 mg total) by mouth 2 (two) times daily with a meal.   Folic Acid 5 MG Caps Take 1 capsule (5 mg total) by mouth daily.   hydrOXYzine 25 MG tablet Commonly known as:  ATARAX/VISTARIL Take 1 tablet (25 mg total) by mouth 3 (three) times daily as needed.   naltrexone 50 MG tablet Commonly known as:  DEPADE Take 2 tablets (100 mg total) by mouth daily.   thiamine 100 MG tablet Take 1 tablet (100 mg total) by mouth daily.   vitamin B-12 250 MCG tablet Commonly known as:  CYANOCOBALAMIN Take 1 tablet (250 mcg total) by mouth daily.      Follow-up Information    Loletta Specter, PA-C. Schedule an appointment as soon as possible for a visit in 1 week(s).   Specialty:  Physician Assistant Contact information: [address] [place] [address] [phone]          No Known Allergies  Consultations:  None   Procedures/Studies: Dg Chest 2 View  Result Date: 08/27/2017 CLINICAL DATA:  Altered mental status EXAM: CHEST - 2 VIEW COMPARISON:  January 04, 2013 FINDINGS: There is scarring in the left mid lung region. There is no edema or consolidation. The heart size and pulmonary vascularity are normal. No adenopathy. No evident bone lesions. IMPRESSION: Scarring left mid lung. No edema or consolidation. Stable cardiac silhouette. Electronically Signed   By: Bretta Bang III M.D.   On: 08/27/2017 13:25   Ct Head Wo Contrast  Result Date: 08/27/2017 CLINICAL DATA:  Altered mental status.  Hypertension. EXAM: CT HEAD WITHOUT CONTRAST TECHNIQUE: Contiguous axial images were obtained from the base of the skull through the vertex without intravenous contrast. COMPARISON:  01/04/2013 FINDINGS: Brain: Generalized brain atrophy. Mild chronic small-vessel ischemic changes of the pons, basal ganglia and hemispheric white matter. No large vessel territory infarction. No sign of acute infarction. No mass lesion,  hemorrhage, hydrocephalus or extra-axial collection. Vascular: There is atherosclerotic calcification of the major vessels at the base of the brain. Skull: Negative Sinuses/Orbits: Sinuses are clear. Old right orbital floor/inferior lamina papyracea fracture. Old appearing nasal fractures. Other: None significant IMPRESSION: No acute finding.  Atrophy and chronic small-vessel ischemic change. Old right orbital and nasal fractures. Electronically Signed   By: Paulina Fusi M.D.   On: 08/27/2017 13:56      Subjective: Patient seen and examined at bedside this morning.  Remains comfortable.  Willing to go home.  Denies rehab. Stable for discharge to home.  Discharge Exam: Vitals:   09/01/17 0804 09/01/17 1241  BP: 105/62 108/76  Pulse: 98 88  Resp: 16 17  Temp: 98.6 F (37 C) 98.2 F (36.8 C)  SpO2: 100% 100%   Vitals:   08/31/17 2357 09/01/17 0236 09/01/17 0804 09/01/17 1241  BP: 119/65 105/68 105/62 108/76  Pulse: 94 100 98 88  Resp: Temp: 98.4 F (36.9 C) 98.3 F (36.8 C) 98.6 F (37 C) 98.2 F (36.8 C)  TempSrc: Oral Oral Axillary Axillary  SpO2: 100% 100% 100% 100%  Weight:      Height:        General: Pt is alert, awake, not in acute distress Cardiovascular: RRR, S1/S2 +, no rubs, no gallops Respiratory: CTA bilaterally, no wheezing,  no rhonchi Abdominal: Soft, NT, ND, bowel sounds +, ventral hernia Extremities: no edema, no cyanosis    The results of significant diagnostics from this hospitalization (including imaging, microbiology, ancillary and laboratory) are listed below for reference.     Microbiology: No results found for this or any previous visit (from the past 240 hour(s)).   Labs: BNP (last 3 results) No results for input(s): BNP in the last 8760 hours. Basic Metabolic Panel: Recent Labs  Lab 08/27/17 1249 08/28/17 0610 08/28/17 1156 08/30/17 0906  NA 136 139 137 133*  K 3.8 3.3* 3.5 3.9  CL 105 108 106 104  CO2 22 22 20* 24   GLUCOSE 97 69 86 123*  BUN CREATININE 1.00 1.03 0.93 0.95  CALCIUM 9.3 8.6* 8.7* 8.7*   Liver Function Tests: Recent Labs  Lab 08/27/17 1249 08/28/17 0610 08/28/17 1156  AST 86* 80* 77*  ALT 35 30 31  ALKPHOS 161* 135* 133*  BILITOT 3.1* 2.9* 2.7*  PROT 8.0 6.8 7.0  ALBUMIN 2.7* 2.4* 2.4*   No results for input(s): LIPASE, AMYLASE in the last 168 hours. Recent Labs  Lab 08/27/17 1735  AMMONIA 26   CBC: Recent Labs  Lab 08/27/17 1249 08/28/17 0610 09/01/17 0453  WBC 4.6 4.1 5.1  NEUTROABS  --   --  2.3  HGB 9.0* 8.4* 9.3*  HCT 28.1* 26.1* 29.6*  MCV 85.4 86.4 87.6  PLT 65* 55* 63*   Cardiac Enzymes: Recent Labs  Lab 08/28/17 0013 08/28/17 0610 08/28/17 1156  TROPONINI <0.03 <0.03 <0.03   BNP: Invalid input(s): POCBNP CBG: Recent Labs  Lab 08/27/17 1305 09/01/17 0619  GLUCAP 93 141*   D-Dimer No results for input(s): DDIMER in the last 72 hours. Hgb A1c No results for input(s): HGBA1C in the last 72 hours. Lipid Profile No results for input(s): CHOL, HDL, LDLCALC, TRIG, CHOLHDL, LDLDIRECT in the last 72 hours. Thyroid function studies No results for input(s): TSH, T4TOTAL, T3FREE, THYROIDAB in the last 72 hours.  Invalid input(s): FREET3 Anemia work up Recent Labs    09/01/17 0453  FERRITIN 23*  TIBC 332  IRON 23*   Urinalysis    Component Value Date/Time   COLORURINE AMBER (A) 08/27/2017 1845   APPEARANCEUR CLEAR 08/27/2017 1845   LABSPEC 1.020 08/27/2017 1845   PHURINE 5.0 08/27/2017 1845   GLUCOSEU NEGATIVE 08/27/2017 1845   HGBUR NEGATIVE 08/27/2017 1845   BILIRUBINUR NEGATIVE 08/27/2017 1845   BILIRUBINUR small 01/28/2017 1422   KETONESUR 5 (A) 08/27/2017 1845   PROTEINUR NEGATIVE 08/27/2017 1845   UROBILINOGEN >=8.0 (A) 01/28/2017 1422   UROBILINOGEN 0.2 01/05/2013 0716   NITRITE NEGATIVE 08/27/2017 1845   LEUKOCYTESUR NEGATIVE 08/27/2017 1845   Sepsis Labs Invalid input(s): PROCALCITONIN,  WBC,   LACTICIDVEN Microbiology No results found for this or any previous visit (from the past 240 hour(s)).   Time coordinating discharge: 35 minutes  SIGNED:   Burnadette Pop, MD  [facility] 09/01/2017, 2:29 PM Pager [phone]  If 7PM-7AM, please contact night-coverage [email or web address] Password [email or web address]

## 2017-09-01 NOTE — Progress Notes (Signed)
CSW following for discharge plan. PT has re-evaluated the patient now that his symptoms have resolved, and patient no longer meetings criteria for SNF placement. CSW alerted RNCM.  CSW signing off.  Blenda Nicely, [place] Clinical Social Worker [phone]

## 2017-09-01 NOTE — Progress Notes (Signed)
Physical Therapy Treatment Patient Details Name: Theodore Brown MRN: [id number] DOB: 02-14-1966 Today's Date: 09/01/2017    History of Present Illness Pt is a 52 y/o male admitted secondary to AMS and LE swelling. CT negative for acute abnormality. PMH includes polysubstance abuse and alcohol abuse.     PT Comments    Patient is progressing very well toward PT goals and overall mod I/I for bed mobility, transfers, and ambulation. Pt disoriented to date. Continue to progress as tolerated.    Follow Up Recommendations  No PT follow up     Equipment Recommendations  None recommended by PT(pt has SPC at home)    Recommendations for Other Services       Precautions / Restrictions      Mobility  Bed Mobility Overal bed mobility: Independent Bed Mobility: Supine to Sit;Sit to Supine              Transfers Overall transfer level: Modified independent   Transfers: Sit to/from Stand           General transfer comment: no AD to stand required but use of SPC upon standing  Ambulation/Gait Ambulation/Gait assistance: Modified independent (Device/Increase time) Ambulation Distance (Feet): 300 Feet Assistive device: Straight cane Gait Pattern/deviations: Step-through pattern     General Gait Details: pt tolerated horizontal and vertical head turns without gait deviations; use of SPC at baseline   Stairs Stairs: Yes Stairs assistance: Modified independent (Device/Increase time) Stair Management: No rails;Alternating pattern;With cane Number of Stairs: 2     Wheelchair Mobility    Modified Rankin (Stroke Patients Only)       Balance Overall balance assessment: Needs assistance Sitting-balance support: No upper extremity supported;Feet supported Sitting balance-Leahy Scale: Good Sitting balance - Comments: pt able to don socks EOB without UE support   Standing balance support: Single extremity supported;During functional activity Standing balance-Leahy  Scale: Fair Standing balance comment: pt able to static stand without UE support and uses SPC for ambulation                            Cognition Arousal/Alertness: Awake/alert Behavior During Therapy: WFL for tasks assessed/performed Overall Cognitive Status: Within Functional Limits for tasks assessed Area of Impairment: Orientation                 Orientation Level: Disoriented to;Time                    Exercises      General Comments        Pertinent Vitals/Pain Pain Assessment: No/denies pain    Home Living                      Prior Function            PT Goals (current goals can now be found in the care plan section) Acute Rehab PT Goals Patient Stated Goal: none stated  PT Goal Formulation: Patient unable to participate in goal setting Time For Goal Achievement: 09/11/17 Potential to Achieve Goals: Fair Progress towards PT goals: Progressing toward goals    Frequency    Min 2X/week      PT Plan Discharge plan needs to be updated    Co-evaluation              AM-PAC PT "6 Clicks" Daily Activity  Outcome Measure  Difficulty turning over in bed (including adjusting bedclothes, sheets and blankets)?: None  Difficulty moving from lying on back to sitting on the side of the bed? : None Difficulty sitting down on and standing up from a chair with arms (e.g., wheelchair, bedside commode, etc,.)?: A Little Help needed moving to and from a bed to chair (including a wheelchair)?: None Help needed walking in hospital room?: A Little Help needed climbing 3-5 steps with a railing? : A Little 6 Click Score: 21    End of Session Equipment Utilized During Treatment: Gait belt Activity Tolerance: Patient tolerated treatment well Patient left: with call bell/phone within reach;in bed Nurse Communication: Mobility status PT Visit Diagnosis: Unsteadiness on feet (R26.81);Other symptoms and signs involving the nervous system  (R29.898);Muscle weakness (generalized) (M62.81)     Time: 1610-9604 PT Time Calculation (min) (ACUTE ONLY): 23 min  Charges:  $Gait Training: 8-22 mins                    G Codes:       Erline Levine, PTA Pager: [phone]     Carolynne Edouard 09/01/2017, 12:02 PM

## 2017-09-09 ENCOUNTER — Ambulatory Visit (INDEPENDENT_AMBULATORY_CARE_PROVIDER_SITE_OTHER): Payer: Self-pay | Admitting: Physician Assistant

## 2018-03-03 ENCOUNTER — Encounter (HOSPITAL_COMMUNITY): Payer: Self-pay | Admitting: Emergency Medicine

## 2018-03-03 ENCOUNTER — Emergency Department (HOSPITAL_COMMUNITY): Payer: Self-pay

## 2018-03-03 ENCOUNTER — Emergency Department (HOSPITAL_COMMUNITY)
Admission: EM | Admit: 2018-03-03 | Discharge: 2018-03-04 | Disposition: A | Discharge status: Home / Self Care | Payer: Self-pay | Attending: Emergency Medicine | Admitting: Emergency Medicine

## 2018-03-03 ENCOUNTER — Other Ambulatory Visit: Payer: Self-pay

## 2018-03-03 DIAGNOSIS — R4182 Altered mental status, unspecified: Secondary | ICD-10-CM | POA: Insufficient documentation

## 2018-03-03 DIAGNOSIS — Z79899 Other long term (current) drug therapy: Secondary | ICD-10-CM | POA: Insufficient documentation

## 2018-03-03 DIAGNOSIS — R410 Disorientation, unspecified: Secondary | ICD-10-CM

## 2018-03-03 LAB — CBC WITH DIFFERENTIAL/PLATELET
Abs Immature Granulocytes: 0.01 10*3/uL (ref 0.00–0.07)
Basophils Absolute: 0 10*3/uL (ref 0.0–0.1)
Basophils Relative: 1 %
Eosinophils Absolute: 0.1 10*3/uL (ref 0.0–0.5)
Eosinophils Relative: 1 %
HCT: 31.8 % — ABNORMAL LOW (ref 39.0–52.0)
Hemoglobin: 10.2 g/dL — ABNORMAL LOW (ref 13.0–17.0)
Immature Granulocytes: 0 %
Lymphocytes Relative: 26 %
Lymphs Abs: 1.1 10*3/uL (ref 0.7–4.0)
MCH: 29.7 pg (ref 26.0–34.0)
MCHC: 32.1 g/dL (ref 30.0–36.0)
MCV: 92.4 fL (ref 80.0–100.0)
Monocytes Absolute: 0.8 10*3/uL (ref 0.1–1.0)
Monocytes Relative: 20 %
Neutro Abs: 2.2 10*3/uL (ref 1.7–7.7)
Neutrophils Relative %: 52 %
Platelets: 53 10*3/uL — ABNORMAL LOW (ref 150–400)
RBC: 3.44 MIL/uL — ABNORMAL LOW (ref 4.22–5.81)
RDW: 19.1 % — ABNORMAL HIGH (ref 11.5–15.5)
WBC: 4.2 10*3/uL (ref 4.0–10.5)
nRBC: 0 % (ref 0.0–0.2)

## 2018-03-03 LAB — COMPREHENSIVE METABOLIC PANEL
ALT: 41 U/L (ref 0–44)
AST: 97 U/L — ABNORMAL HIGH (ref 15–41)
Albumin: 2.6 g/dL — ABNORMAL LOW (ref 3.5–5.0)
Alkaline Phosphatase: 198 U/L — ABNORMAL HIGH (ref 38–126)
Anion gap: 6 (ref 5–15)
BUN: 12 mg/dL (ref 6–20)
CO2: 24 mmol/L (ref 22–32)
Calcium: 9 mg/dL (ref 8.9–10.3)
Chloride: 109 mmol/L (ref 98–111)
Creatinine, Ser: 0.92 mg/dL (ref 0.61–1.24)
GFR calc Af Amer: >60 mL/min (ref >60)
GFR calc non Af Amer: >60 mL/min (ref >60)
Glucose, Bld: 84 mg/dL (ref 70–99)
Potassium: 3.5 mmol/L (ref 3.5–5.1)
Sodium: 139 mmol/L (ref 135–145)
Total Bilirubin: 2.8 mg/dL — ABNORMAL HIGH (ref 0.3–1.2)
Total Protein: 6.7 g/dL (ref 6.5–8.1)

## 2018-03-03 LAB — AMMONIA: Ammonia: 28 umol/L (ref 9–35)

## 2018-03-03 LAB — PROTIME-INR
INR: 1.34
Prothrombin Time: 16.5 seconds — ABNORMAL HIGH (ref 11.4–15.2)

## 2018-03-03 LAB — ETHANOL: Alcohol, Ethyl (B): <10 mg/dL (ref <10)

## 2018-03-03 LAB — APTT: aPTT: 36 seconds (ref 24–36)

## 2018-03-03 MED ORDER — SODIUM CHLORIDE 0.9 % IV BOLUS
1000.0000 mL | Freq: Once | INTRAVENOUS | Status: AC
  Administered 2018-03-03: 1000 mL via INTRAVENOUS

## 2018-03-03 MED ORDER — SODIUM CHLORIDE 0.9 % IV SOLN
INTRAVENOUS | Status: DC
  Administered 2018-03-03: 23:00:00 via INTRAVENOUS

## 2018-03-03 MED ORDER — THIAMINE HCL 100 MG/ML IJ SOLN
100.0000 mg | Freq: Once | INTRAMUSCULAR | Status: AC
  Administered 2018-03-03: 100 mg via INTRAVENOUS
  Filled 2018-03-03: qty 2
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
## 2018-03-03 NOTE — ED Triage Notes (Addendum)
Per EMS, pt coming in from home with complains of AMS from his mother stating that he has been "off" today. Pt admits to having "4-5 beers" and taking a friends muscle relaxer. Pt ios currently a&o x3. VSS.

## 2018-03-04 LAB — URINALYSIS, COMPLETE (UACMP) WITH MICROSCOPIC
Bacteria, UA: NONE SEEN
Bilirubin Urine: NEGATIVE
Glucose, UA: NEGATIVE mg/dL
Hgb urine dipstick: NEGATIVE
Ketones, ur: 5 mg/dL — AB
Nitrite: NEGATIVE
Protein, ur: NEGATIVE mg/dL
Specific Gravity, Urine: 1.018 (ref 1.005–1.030)
pH: 5 (ref 5.0–8.0)

## 2018-03-04 LAB — RAPID URINE DRUG SCREEN, HOSP PERFORMED
Amphetamines: NOT DETECTED
Barbiturates: NOT DETECTED
Benzodiazepines: NOT DETECTED
Cocaine: POSITIVE — AB
Opiates: NOT DETECTED
Tetrahydrocannabinol: POSITIVE — AB

## 2018-03-04 MED ORDER — LORAZEPAM 1 MG PO TABS: 0.0000 mg | ORAL_TABLET | Freq: Four times a day (QID) | ORAL | Status: DC

## 2018-03-04 MED ORDER — LORAZEPAM 1 MG PO TABS: 0.0000 mg | ORAL_TABLET | Freq: Two times a day (BID) | ORAL | Status: DC

## 2018-03-04 MED ORDER — LORAZEPAM 2 MG/ML IJ SOLN: 0.0000 mg | Freq: Two times a day (BID) | INTRAMUSCULAR | Status: DC

## 2018-03-04 MED ORDER — LORAZEPAM 2 MG/ML IJ SOLN: 0.0000 mg | Freq: Four times a day (QID) | INTRAMUSCULAR | Status: DC

## 2018-03-04 NOTE — ED Notes (Signed)
Patient refused to be admitted despite multiple attempts by PA to convince pt . , she explained benefits and consequences if le leaves AMA.

## 2018-03-04 NOTE — Discharge Instructions (Signed)
Please follow-up with your doctor for further evaluation and treatment of your symptoms. We have recommended you stay in the hospital for further evaluation. Please understand you are leaving AGAINST MEDICAL ADVICE today and that leaving could result in worsening of your symptoms, including death.  Please feel free to return the emergency department if develop any new or worsening symptoms.

## 2018-12-30 ENCOUNTER — Encounter (HOSPITAL_COMMUNITY): Payer: Self-pay | Admitting: Emergency Medicine

## 2018-12-30 ENCOUNTER — Ambulatory Visit (HOSPITAL_COMMUNITY)
Admission: EM | Admit: 2018-12-30 | Discharge: 2018-12-30 | Disposition: A | Discharge status: Home / Self Care | Payer: Self-pay | Attending: Family Medicine | Admitting: Family Medicine

## 2018-12-30 ENCOUNTER — Other Ambulatory Visit: Payer: Self-pay

## 2018-12-30 DIAGNOSIS — S41101A Unspecified open wound of right upper arm, initial encounter: Secondary | ICD-10-CM

## 2018-12-30 MED ORDER — HYDROCODONE-ACETAMINOPHEN 5-325 MG PO TABS
ORAL_TABLET | ORAL | Status: AC
  Filled 2018-12-30: qty 1

## 2018-12-30 MED ORDER — SILVER SULFADIAZINE 1 % EX CREA: 1.0000 "application " | TOPICAL_CREAM | Freq: Every day | CUTANEOUS | 0 refills | Status: DC

## 2018-12-30 MED ORDER — NAPROXEN 500 MG PO TABS: 500.0000 mg | ORAL_TABLET | Freq: Two times a day (BID) | ORAL | 0 refills | Status: DC

## 2018-12-30 MED ORDER — SILVER SULFADIAZINE 1 % EX CREA
TOPICAL_CREAM | CUTANEOUS | Status: AC
  Filled 2018-12-30: qty 85

## 2018-12-30 MED ORDER — HYDROCODONE-ACETAMINOPHEN 5-325 MG PO TABS
2.0000 | ORAL_TABLET | Freq: Once | ORAL | Status: AC
  Administered 2018-12-30: 2 via ORAL
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
## 2018-12-30 NOTE — Discharge Instructions (Signed)
Clean wound once a day and apply the silvadene cream May take naproxen for pain\ Take with food Return for any complications or infections
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
## 2019-06-16 ENCOUNTER — Emergency Department (HOSPITAL_COMMUNITY): Payer: Self-pay

## 2019-06-16 ENCOUNTER — Inpatient Hospital Stay (HOSPITAL_COMMUNITY)
Admission: EM | Admit: 2019-06-16 | Discharge: 2019-06-24 | DRG: 871 | Discharge status: Against Medical Advice | Payer: Self-pay | Attending: Internal Medicine | Admitting: Internal Medicine

## 2019-06-16 ENCOUNTER — Other Ambulatory Visit: Payer: Self-pay

## 2019-06-16 ENCOUNTER — Encounter (HOSPITAL_COMMUNITY): Payer: Self-pay

## 2019-06-16 DIAGNOSIS — R64 Cachexia: Secondary | ICD-10-CM | POA: Diagnosis present

## 2019-06-16 DIAGNOSIS — Y92009 Unspecified place in unspecified non-institutional (private) residence as the place of occurrence of the external cause: Secondary | ICD-10-CM

## 2019-06-16 DIAGNOSIS — S1989XA Other specified injuries of other specified part of neck, initial encounter: Secondary | ICD-10-CM | POA: Diagnosis present

## 2019-06-16 DIAGNOSIS — Z6821 Body mass index (BMI) 21.0-21.9, adult: Secondary | ICD-10-CM

## 2019-06-16 DIAGNOSIS — K709 Alcoholic liver disease, unspecified: Secondary | ICD-10-CM | POA: Diagnosis present

## 2019-06-16 DIAGNOSIS — G9341 Metabolic encephalopathy: Secondary | ICD-10-CM | POA: Diagnosis present

## 2019-06-16 DIAGNOSIS — Z20822 Contact with and (suspected) exposure to covid-19: Secondary | ICD-10-CM | POA: Diagnosis present

## 2019-06-16 DIAGNOSIS — G9519 Other vascular myelopathies: Secondary | ICD-10-CM | POA: Diagnosis present

## 2019-06-16 DIAGNOSIS — F1010 Alcohol abuse, uncomplicated: Secondary | ICD-10-CM | POA: Diagnosis present

## 2019-06-16 DIAGNOSIS — Z5329 Procedure and treatment not carried out because of patient's decision for other reasons: Secondary | ICD-10-CM | POA: Diagnosis present

## 2019-06-16 DIAGNOSIS — D509 Iron deficiency anemia, unspecified: Secondary | ICD-10-CM | POA: Diagnosis present

## 2019-06-16 DIAGNOSIS — E876 Hypokalemia: Secondary | ICD-10-CM | POA: Diagnosis present

## 2019-06-16 DIAGNOSIS — R6520 Severe sepsis without septic shock: Secondary | ICD-10-CM | POA: Diagnosis present

## 2019-06-16 DIAGNOSIS — S14109A Unspecified injury at unspecified level of cervical spinal cord, initial encounter: Secondary | ICD-10-CM

## 2019-06-16 DIAGNOSIS — R7881 Bacteremia: Secondary | ICD-10-CM

## 2019-06-16 DIAGNOSIS — D6959 Other secondary thrombocytopenia: Secondary | ICD-10-CM | POA: Diagnosis present

## 2019-06-16 DIAGNOSIS — E872 Acidosis: Secondary | ICD-10-CM | POA: Diagnosis present

## 2019-06-16 DIAGNOSIS — I38 Endocarditis, valve unspecified: Secondary | ICD-10-CM | POA: Diagnosis present

## 2019-06-16 DIAGNOSIS — F1020 Alcohol dependence, uncomplicated: Secondary | ICD-10-CM

## 2019-06-16 DIAGNOSIS — M4802 Spinal stenosis, cervical region: Secondary | ICD-10-CM | POA: Diagnosis present

## 2019-06-16 DIAGNOSIS — M5412 Radiculopathy, cervical region: Secondary | ICD-10-CM | POA: Diagnosis present

## 2019-06-16 DIAGNOSIS — N179 Acute kidney failure, unspecified: Secondary | ICD-10-CM | POA: Diagnosis present

## 2019-06-16 DIAGNOSIS — D689 Coagulation defect, unspecified: Secondary | ICD-10-CM | POA: Diagnosis present

## 2019-06-16 DIAGNOSIS — K7010 Alcoholic hepatitis without ascites: Secondary | ICD-10-CM | POA: Diagnosis present

## 2019-06-16 DIAGNOSIS — W1830XA Fall on same level, unspecified, initial encounter: Secondary | ICD-10-CM | POA: Diagnosis present

## 2019-06-16 DIAGNOSIS — R296 Repeated falls: Secondary | ICD-10-CM | POA: Diagnosis present

## 2019-06-16 DIAGNOSIS — R6510 Systemic inflammatory response syndrome (SIRS) of non-infectious origin without acute organ dysfunction: Secondary | ICD-10-CM

## 2019-06-16 DIAGNOSIS — E860 Dehydration: Secondary | ICD-10-CM | POA: Diagnosis present

## 2019-06-16 DIAGNOSIS — D649 Anemia, unspecified: Secondary | ICD-10-CM

## 2019-06-16 DIAGNOSIS — M5031 Other cervical disc degeneration,  high cervical region: Secondary | ICD-10-CM | POA: Diagnosis present

## 2019-06-16 DIAGNOSIS — R161 Splenomegaly, not elsewhere classified: Secondary | ICD-10-CM | POA: Diagnosis present

## 2019-06-16 DIAGNOSIS — B955 Unspecified streptococcus as the cause of diseases classified elsewhere: Secondary | ICD-10-CM

## 2019-06-16 DIAGNOSIS — L989 Disorder of the skin and subcutaneous tissue, unspecified: Secondary | ICD-10-CM | POA: Diagnosis present

## 2019-06-16 DIAGNOSIS — D72829 Elevated white blood cell count, unspecified: Secondary | ICD-10-CM

## 2019-06-16 DIAGNOSIS — E871 Hypo-osmolality and hyponatremia: Secondary | ICD-10-CM | POA: Diagnosis present

## 2019-06-16 DIAGNOSIS — K7030 Alcoholic cirrhosis of liver without ascites: Secondary | ICD-10-CM | POA: Diagnosis present

## 2019-06-16 DIAGNOSIS — S12190A Other displaced fracture of second cervical vertebra, initial encounter for closed fracture: Secondary | ICD-10-CM | POA: Diagnosis present

## 2019-06-16 DIAGNOSIS — A408 Other streptococcal sepsis: Principal | ICD-10-CM | POA: Diagnosis present

## 2019-06-16 LAB — LACTIC ACID, PLASMA
Lactic Acid, Venous: 3.9 mmol/L (ref 0.5–1.9)
Lactic Acid, Venous: 3.1 mmol/L (ref 0.5–1.9)

## 2019-06-16 LAB — COMPREHENSIVE METABOLIC PANEL
ALT: 45 U/L — ABNORMAL HIGH (ref 0–44)
AST: 104 U/L — ABNORMAL HIGH (ref 15–41)
Albumin: 2.1 g/dL — ABNORMAL LOW (ref 3.5–5.0)
Alkaline Phosphatase: 85 U/L (ref 38–126)
Anion gap: 11 (ref 5–15)
BUN: 22 mg/dL — ABNORMAL HIGH (ref 6–20)
CO2: 22 mmol/L (ref 22–32)
Calcium: 8.1 mg/dL — ABNORMAL LOW (ref 8.9–10.3)
Chloride: 91 mmol/L — ABNORMAL LOW (ref 98–111)
Creatinine, Ser: 1.34 mg/dL — ABNORMAL HIGH (ref 0.61–1.24)
GFR calc Af Amer: >60 mL/min (ref >60)
GFR calc non Af Amer: >60 mL/min (ref >60)
Glucose, Bld: 96 mg/dL (ref 70–99)
Potassium: 3.4 mmol/L — ABNORMAL LOW (ref 3.5–5.1)
Sodium: 124 mmol/L — ABNORMAL LOW (ref 135–145)
Total Bilirubin: 2.4 mg/dL — ABNORMAL HIGH (ref 0.3–1.2)
Total Protein: 6.6 g/dL (ref 6.5–8.1)

## 2019-06-16 LAB — CBC
HCT: 23.6 % — ABNORMAL LOW (ref 39.0–52.0)
Hemoglobin: 7 g/dL — ABNORMAL LOW (ref 13.0–17.0)
MCH: 19.5 pg — ABNORMAL LOW (ref 26.0–34.0)
MCHC: 29.7 g/dL — ABNORMAL LOW (ref 30.0–36.0)
MCV: 65.7 fL — ABNORMAL LOW (ref 80.0–100.0)
Platelets: 52 10*3/uL — ABNORMAL LOW (ref 150–400)
RBC: 3.59 MIL/uL — ABNORMAL LOW (ref 4.22–5.81)
RDW: 21.8 % — ABNORMAL HIGH (ref 11.5–15.5)
WBC: 23.2 10*3/uL — ABNORMAL HIGH (ref 4.0–10.5)
nRBC: 0.6 % — ABNORMAL HIGH (ref 0.0–0.2)

## 2019-06-16 LAB — URINALYSIS, ROUTINE W REFLEX MICROSCOPIC
Bilirubin Urine: NEGATIVE
Glucose, UA: NEGATIVE mg/dL
Hgb urine dipstick: NEGATIVE
Ketones, ur: NEGATIVE mg/dL
Leukocytes,Ua: NEGATIVE
Nitrite: NEGATIVE
Protein, ur: NEGATIVE mg/dL
Specific Gravity, Urine: 1.019 (ref 1.005–1.030)
pH: 5 (ref 5.0–8.0)

## 2019-06-16 LAB — AMMONIA: Ammonia: 30 umol/L (ref 9–35)

## 2019-06-16 LAB — PROTIME-INR
INR: 1.5 — ABNORMAL HIGH (ref 0.8–1.2)
Prothrombin Time: 17.6 seconds — ABNORMAL HIGH (ref 11.4–15.2)

## 2019-06-16 LAB — ETHANOL: Alcohol, Ethyl (B): <10 mg/dL (ref <10)

## 2019-06-16 LAB — RESPIRATORY PANEL BY RT PCR (FLU A&B, COVID)
Influenza A by PCR: NEGATIVE
Influenza B by PCR: NEGATIVE
SARS Coronavirus 2 by RT PCR: NEGATIVE

## 2019-06-16 LAB — TROPONIN I (HIGH SENSITIVITY)
Troponin I (High Sensitivity): 5 ng/L (ref <18)
Troponin I (High Sensitivity): 6 ng/L (ref <18)

## 2019-06-16 LAB — PREPARE RBC (CROSSMATCH)

## 2019-06-16 LAB — CBG MONITORING, ED: Glucose-Capillary: 96 mg/dL (ref 70–99)

## 2019-06-16 LAB — RAPID URINE DRUG SCREEN, HOSP PERFORMED
Amphetamines: NOT DETECTED
Barbiturates: NOT DETECTED
Benzodiazepines: NOT DETECTED
Cocaine: NOT DETECTED
Opiates: NOT DETECTED
Tetrahydrocannabinol: NOT DETECTED

## 2019-06-16 LAB — POC OCCULT BLOOD, ED: Fecal Occult Bld: NEGATIVE

## 2019-06-16 MED ORDER — FOLIC ACID 1 MG PO TABS: 1.0000 mg | ORAL_TABLET | Freq: Every day | ORAL | Status: DC

## 2019-06-16 MED ORDER — LORAZEPAM 1 MG PO TABS: 1.0000 mg | ORAL_TABLET | ORAL | Status: AC | PRN

## 2019-06-16 MED ORDER — THIAMINE HCL 100 MG/ML IJ SOLN
100.0000 mg | Freq: Every day | INTRAMUSCULAR | Status: DC
  Administered 2019-06-17: 100 mg via INTRAVENOUS
  Filled 2019-06-16: qty 2

## 2019-06-16 MED ORDER — OXYCODONE-ACETAMINOPHEN 5-325 MG PO TABS
1.0000 | ORAL_TABLET | Freq: Once | ORAL | Status: AC
  Administered 2019-06-16: 1 via ORAL
  Filled 2019-06-16: qty 1

## 2019-06-16 MED ORDER — SODIUM CHLORIDE 0.9% IV SOLUTION
Freq: Once | INTRAVENOUS | Status: AC
  Administered 2019-06-16: 1 mL via INTRAVENOUS

## 2019-06-16 MED ORDER — POTASSIUM CHLORIDE IN NACL 20-0.9 MEQ/L-% IV SOLN
INTRAVENOUS | Status: DC
  Filled 2019-06-16: qty 1000

## 2019-06-16 MED ORDER — ADULT MULTIVITAMIN W/MINERALS CH
1.0000 | ORAL_TABLET | Freq: Every day | ORAL | Status: DC
  Administered 2019-06-17: 1 via ORAL
  Filled 2019-06-16: qty 1

## 2019-06-16 MED ORDER — LORAZEPAM 2 MG/ML IJ SOLN: 1.0000 mg | INTRAMUSCULAR | Status: AC | PRN

## 2019-06-16 MED ORDER — IOHEXOL 350 MG/ML SOLN: 100.0000 mL | Freq: Once | INTRAVENOUS | Status: DC | PRN

## 2019-06-16 MED ORDER — IOHEXOL 300 MG/ML  SOLN
100.0000 mL | Freq: Once | INTRAMUSCULAR | Status: AC | PRN
  Administered 2019-06-16: 100 mL via INTRAVENOUS

## 2019-06-16 MED ORDER — THIAMINE HCL 100 MG PO TABS: 100.0000 mg | ORAL_TABLET | Freq: Every day | ORAL | Status: DC

## 2019-06-16 MED ORDER — THIAMINE HCL 100 MG/ML IJ SOLN
Freq: Once | INTRAVENOUS | Status: AC
  Filled 2019-06-16: qty 1000

## 2019-06-16 MED ORDER — THIAMINE HCL 100 MG/ML IJ SOLN
INTRAVENOUS | Status: DC
  Filled 2019-06-16: qty 1000

## 2019-06-16 MED ORDER — FERROUS SULFATE 325 (65 FE) MG PO TABS
325.0000 mg | ORAL_TABLET | Freq: Two times a day (BID) | ORAL | Status: DC
Start: 2019-06-17 — End: 2019-06-24
  Administered 2019-06-17 – 2019-06-23 (×14): 325 mg via ORAL
  Filled 2019-06-16 (×14): qty 1

## 2019-06-16 MED ORDER — SODIUM CHLORIDE 0.9 % IV BOLUS
1000.0000 mL | Freq: Once | INTRAVENOUS | Status: AC
  Administered 2019-06-16: 1000 mL via INTRAVENOUS

## 2019-06-16 MED ORDER — FOLIC ACID 1 MG PO TABS
5.0000 mg | ORAL_TABLET | Freq: Every day | ORAL | Status: DC
  Administered 2019-06-17: 5 mg via ORAL
  Filled 2019-06-16: qty 5

## 2019-06-16 MED ORDER — POTASSIUM CHLORIDE CRYS ER 20 MEQ PO TBCR
40.0000 meq | EXTENDED_RELEASE_TABLET | Freq: Once | ORAL | Status: AC
  Administered 2019-06-17: 40 meq via ORAL
  Filled 2019-06-16: qty 2
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
## 2019-06-16 NOTE — ED Triage Notes (Signed)
Pt BIB [facility] for eval of back pain. Pt reports he woke up w/ back pain 3 days ago and its progressively gotten worse. Pt reports fall from standing this AM, reports that he was feeling dizzy standing up and fell to the side. Pt is not anticoagulated. EMS also reports some confusion, which mother reports is new the last month or so. Pt believes that it is 2010. CBG 140 w/ EMS. No obvious s/sx of trauma, aside from confusion, neuro intact in triage w/ no new deficits.

## 2019-06-16 NOTE — ED Provider Notes (Signed)
54 year old male increased confusion, frequent falls, headache, neck pain.  Work-up concerning for leukocytosis, anemia, elevated lactate.  Guaiac negative.  Given 1 unit PRBCs.  CT scans ordered to evaluate for possible trauma.  Initially hypotensive, now normotensive post fluids.  Anticipate admission once trauma work-up has been completed.  3:31 PM Received signout from Dr. Particia Nearing, resident Ephriam Knuckles  7:15 PM reviewed results, updated patient, BP remains stable  7:31 PM discussed with hospitalist for admission   Milagros Loll, MD 06/16/19 1931

## 2019-06-16 NOTE — ED Notes (Signed)
Visually checked on Pt. Vitals WDL 112/56, map 72, 99% RA 19 resp. HR 100. Pt. States that he feels better after eating

## 2019-06-17 ENCOUNTER — Inpatient Hospital Stay (HOSPITAL_COMMUNITY): Payer: Self-pay

## 2019-06-17 ENCOUNTER — Other Ambulatory Visit: Payer: Self-pay

## 2019-06-17 DIAGNOSIS — Y92009 Unspecified place in unspecified non-institutional (private) residence as the place of occurrence of the external cause: Secondary | ICD-10-CM

## 2019-06-17 DIAGNOSIS — A419 Sepsis, unspecified organism: Secondary | ICD-10-CM

## 2019-06-17 DIAGNOSIS — R748 Abnormal levels of other serum enzymes: Secondary | ICD-10-CM

## 2019-06-17 DIAGNOSIS — S064X0A Epidural hemorrhage without loss of consciousness, initial encounter: Secondary | ICD-10-CM

## 2019-06-17 DIAGNOSIS — R6520 Severe sepsis without septic shock: Secondary | ICD-10-CM

## 2019-06-17 DIAGNOSIS — B955 Unspecified streptococcus as the cause of diseases classified elsewhere: Secondary | ICD-10-CM

## 2019-06-17 DIAGNOSIS — F1010 Alcohol abuse, uncomplicated: Secondary | ICD-10-CM

## 2019-06-17 DIAGNOSIS — I959 Hypotension, unspecified: Secondary | ICD-10-CM

## 2019-06-17 DIAGNOSIS — D696 Thrombocytopenia, unspecified: Secondary | ICD-10-CM

## 2019-06-17 DIAGNOSIS — F1020 Alcohol dependence, uncomplicated: Secondary | ICD-10-CM

## 2019-06-17 DIAGNOSIS — I340 Nonrheumatic mitral (valve) insufficiency: Secondary | ICD-10-CM

## 2019-06-17 DIAGNOSIS — I361 Nonrheumatic tricuspid (valve) insufficiency: Secondary | ICD-10-CM

## 2019-06-17 DIAGNOSIS — K0889 Other specified disorders of teeth and supporting structures: Secondary | ICD-10-CM

## 2019-06-17 DIAGNOSIS — D509 Iron deficiency anemia, unspecified: Secondary | ICD-10-CM

## 2019-06-17 DIAGNOSIS — K7031 Alcoholic cirrhosis of liver with ascites: Secondary | ICD-10-CM

## 2019-06-17 DIAGNOSIS — R7881 Bacteremia: Secondary | ICD-10-CM

## 2019-06-17 DIAGNOSIS — N179 Acute kidney failure, unspecified: Secondary | ICD-10-CM

## 2019-06-17 DIAGNOSIS — E876 Hypokalemia: Secondary | ICD-10-CM

## 2019-06-17 DIAGNOSIS — Z9181 History of falling: Secondary | ICD-10-CM

## 2019-06-17 DIAGNOSIS — M542 Cervicalgia: Secondary | ICD-10-CM

## 2019-06-17 DIAGNOSIS — R202 Paresthesia of skin: Secondary | ICD-10-CM

## 2019-06-17 DIAGNOSIS — K7460 Unspecified cirrhosis of liver: Secondary | ICD-10-CM

## 2019-06-17 DIAGNOSIS — S14109A Unspecified injury at unspecified level of cervical spinal cord, initial encounter: Secondary | ICD-10-CM

## 2019-06-17 DIAGNOSIS — E871 Hypo-osmolality and hyponatremia: Secondary | ICD-10-CM

## 2019-06-17 LAB — MAGNESIUM
Magnesium: 2.2 mg/dL (ref 1.7–2.4)
Magnesium: 2 mg/dL (ref 1.7–2.4)

## 2019-06-17 LAB — BLOOD CULTURE ID PANEL (REFLEXED)

## 2019-06-17 LAB — CBC
HCT: 25.5 % — ABNORMAL LOW (ref 39.0–52.0)
HCT: 24.5 % — ABNORMAL LOW (ref 39.0–52.0)
Hemoglobin: 7.9 g/dL — ABNORMAL LOW (ref 13.0–17.0)
Hemoglobin: 7.7 g/dL — ABNORMAL LOW (ref 13.0–17.0)
MCH: 21 pg — ABNORMAL LOW (ref 26.0–34.0)
MCH: 20.9 pg — ABNORMAL LOW (ref 26.0–34.0)
MCHC: 31 g/dL (ref 30.0–36.0)
MCHC: 31.4 g/dL (ref 30.0–36.0)
MCV: 67.6 fL — ABNORMAL LOW (ref 80.0–100.0)
MCV: 66.6 fL — ABNORMAL LOW (ref 80.0–100.0)
Platelets: 51 10*3/uL — ABNORMAL LOW (ref 150–400)
Platelets: 50 10*3/uL — ABNORMAL LOW (ref 150–400)
RBC: 3.77 MIL/uL — ABNORMAL LOW (ref 4.22–5.81)
RBC: 3.68 MIL/uL — ABNORMAL LOW (ref 4.22–5.81)
RDW: 23.6 % — ABNORMAL HIGH (ref 11.5–15.5)
RDW: 23.5 % — ABNORMAL HIGH (ref 11.5–15.5)
WBC: 17.7 10*3/uL — ABNORMAL HIGH (ref 4.0–10.5)
WBC: 16.4 10*3/uL — ABNORMAL HIGH (ref 4.0–10.5)
nRBC: 1.7 % — ABNORMAL HIGH (ref 0.0–0.2)
nRBC: 2.1 % — ABNORMAL HIGH (ref 0.0–0.2)

## 2019-06-17 LAB — OSMOLALITY, URINE: Osmolality, Ur: 572 mOsm/kg (ref 300–900)

## 2019-06-17 LAB — COMPREHENSIVE METABOLIC PANEL
ALT: 40 U/L (ref 0–44)
ALT: 41 U/L (ref 0–44)
AST: 96 U/L — ABNORMAL HIGH (ref 15–41)
AST: 88 U/L — ABNORMAL HIGH (ref 15–41)
Albumin: 2 g/dL — ABNORMAL LOW (ref 3.5–5.0)
Albumin: 1.8 g/dL — ABNORMAL LOW (ref 3.5–5.0)
Alkaline Phosphatase: 92 U/L (ref 38–126)
Alkaline Phosphatase: 85 U/L (ref 38–126)
Anion gap: 10 (ref 5–15)
Anion gap: 8 (ref 5–15)
BUN: 22 mg/dL — ABNORMAL HIGH (ref 6–20)
BUN: 20 mg/dL (ref 6–20)
CO2: 22 mmol/L (ref 22–32)
CO2: 23 mmol/L (ref 22–32)
Calcium: 7.9 mg/dL — ABNORMAL LOW (ref 8.9–10.3)
Calcium: 7.7 mg/dL — ABNORMAL LOW (ref 8.9–10.3)
Chloride: 93 mmol/L — ABNORMAL LOW (ref 98–111)
Chloride: 97 mmol/L — ABNORMAL LOW (ref 98–111)
Creatinine, Ser: 1.18 mg/dL (ref 0.61–1.24)
Creatinine, Ser: 0.99 mg/dL (ref 0.61–1.24)
GFR calc Af Amer: >60 mL/min (ref >60)
GFR calc Af Amer: >60 mL/min (ref >60)
GFR calc non Af Amer: >60 mL/min (ref >60)
GFR calc non Af Amer: >60 mL/min (ref >60)
Glucose, Bld: 82 mg/dL (ref 70–99)
Glucose, Bld: 89 mg/dL (ref 70–99)
Potassium: 3.3 mmol/L — ABNORMAL LOW (ref 3.5–5.1)
Potassium: 3.4 mmol/L — ABNORMAL LOW (ref 3.5–5.1)
Sodium: 125 mmol/L — ABNORMAL LOW (ref 135–145)
Sodium: 128 mmol/L — ABNORMAL LOW (ref 135–145)
Total Bilirubin: 3.1 mg/dL — ABNORMAL HIGH (ref 0.3–1.2)
Total Bilirubin: 2.6 mg/dL — ABNORMAL HIGH (ref 0.3–1.2)
Total Protein: 6.1 g/dL — ABNORMAL LOW (ref 6.5–8.1)
Total Protein: 5.9 g/dL — ABNORMAL LOW (ref 6.5–8.1)

## 2019-06-17 LAB — IRON AND TIBC
Iron: 42 ug/dL — ABNORMAL LOW (ref 45–182)
Saturation Ratios: 12 % — ABNORMAL LOW (ref 17.9–39.5)
TIBC: 351 ug/dL (ref 250–450)
UIBC: 309 ug/dL

## 2019-06-17 LAB — PHOSPHORUS
Phosphorus: 1.7 mg/dL — ABNORMAL LOW (ref 2.5–4.6)
Phosphorus: <1 mg/dL — CL (ref 2.5–4.6)

## 2019-06-17 LAB — VITAMIN B12
Vitamin B-12: 824 pg/mL (ref 180–914)
Vitamin B-12: 1271 pg/mL — ABNORMAL HIGH (ref 180–914)

## 2019-06-17 LAB — TYPE AND SCREEN
ABO/RH(D): O POS
Antibody Screen: NEGATIVE
Unit division: 0

## 2019-06-17 LAB — PROTIME-INR
INR: 1.4 — ABNORMAL HIGH (ref 0.8–1.2)
Prothrombin Time: 16.8 seconds — ABNORMAL HIGH (ref 11.4–15.2)

## 2019-06-17 LAB — OSMOLALITY: Osmolality: 273 mOsm/kg — ABNORMAL LOW (ref 275–295)

## 2019-06-17 LAB — HEMOGLOBIN: Hemoglobin: 7.5 g/dL — ABNORMAL LOW (ref 13.0–17.0)

## 2019-06-17 LAB — RETICULOCYTES
Immature Retic Fract: 36.3 % — ABNORMAL HIGH (ref 2.3–15.9)
RBC.: 3.54 MIL/uL — ABNORMAL LOW (ref 4.22–5.81)
Retic Count, Absolute: 55.2 10*3/uL (ref 19.0–186.0)
Retic Ct Pct: 1.6 % (ref 0.4–3.1)

## 2019-06-17 LAB — ECHOCARDIOGRAM COMPLETE
Height: 67 in
Weight: 2136 oz

## 2019-06-17 LAB — BPAM RBC
Blood Product Expiration Date: 202103112359
ISSUE DATE / TIME: 202102111415
Unit Type and Rh: 5100

## 2019-06-17 LAB — FOLATE: Folate: 87 ng/mL (ref >5.9)

## 2019-06-17 LAB — GLUCOSE, CAPILLARY: Glucose-Capillary: 89 mg/dL (ref 70–99)

## 2019-06-17 LAB — TSH: TSH: 1.769 u[IU]/mL (ref 0.350–4.500)

## 2019-06-17 LAB — LACTIC ACID, PLASMA: Lactic Acid, Venous: 2.1 mmol/L (ref 0.5–1.9)

## 2019-06-17 LAB — SODIUM, URINE, RANDOM: Sodium, Ur: <10 mmol/L

## 2019-06-17 LAB — FERRITIN: Ferritin: 85 ng/mL (ref 24–336)

## 2019-06-17 LAB — AMMONIA: Ammonia: 33 umol/L (ref 9–35)

## 2019-06-17 LAB — HEMATOCRIT: HCT: 23.9 % — ABNORMAL LOW (ref 39.0–52.0)

## 2019-06-17 LAB — SARS CORONAVIRUS 2 (TAT 6-24 HRS): SARS Coronavirus 2: NEGATIVE

## 2019-06-17 LAB — HIV ANTIBODY (ROUTINE TESTING W REFLEX): HIV Screen 4th Generation wRfx: NONREACTIVE

## 2019-06-17 MED ORDER — POTASSIUM PHOSPHATES 15 MMOLE/5ML IV SOLN
30.0000 mmol | Freq: Once | INTRAVENOUS | Status: AC
  Administered 2019-06-17: 30 mmol via INTRAVENOUS
  Filled 2019-06-17: qty 10

## 2019-06-17 MED ORDER — SODIUM CHLORIDE 0.9 % IV SOLN
2.0000 g | INTRAVENOUS | Status: DC
  Administered 2019-06-17 – 2019-06-20 (×4): 2 g via INTRAVENOUS
  Filled 2019-06-17 (×3): qty 20
  Filled 2019-06-17 (×2): qty 2

## 2019-06-17 MED ORDER — VANCOMYCIN HCL 750 MG/150ML IV SOLN
750.0000 mg | Freq: Two times a day (BID) | INTRAVENOUS | Status: DC
  Administered 2019-06-18 – 2019-06-19 (×3): 750 mg via INTRAVENOUS
  Filled 2019-06-17 (×6): qty 150

## 2019-06-17 MED ORDER — THIAMINE HCL 100 MG/ML IJ SOLN: 100.0000 mg | Freq: Every day | INTRAMUSCULAR | Status: DC

## 2019-06-17 MED ORDER — THIAMINE HCL 100 MG PO TABS
100.0000 mg | ORAL_TABLET | Freq: Every day | ORAL | Status: DC
  Administered 2019-06-20 – 2019-06-23 (×4): 100 mg via ORAL
  Filled 2019-06-17 (×5): qty 1

## 2019-06-17 MED ORDER — VANCOMYCIN HCL 1250 MG/250ML IV SOLN
1250.0000 mg | Freq: Once | INTRAVENOUS | Status: AC
  Administered 2019-06-17: 1250 mg via INTRAVENOUS
  Filled 2019-06-17: qty 250

## 2019-06-17 MED ORDER — FOLIC ACID 1 MG PO TABS
5.0000 mg | ORAL_TABLET | Freq: Every day | ORAL | Status: DC
  Administered 2019-06-20 – 2019-06-23 (×4): 5 mg via ORAL
  Filled 2019-06-17 (×6): qty 5

## 2019-06-17 MED ORDER — THIAMINE HCL 100 MG/ML IJ SOLN
INTRAVENOUS | Status: AC
  Filled 2019-06-17 (×3): qty 1000

## 2019-06-17 MED ORDER — MORPHINE SULFATE (PF) 2 MG/ML IV SOLN
2.0000 mg | INTRAVENOUS | Status: DC | PRN
  Administered 2019-06-17: 2 mg via INTRAVENOUS
  Filled 2019-06-17: qty 1

## 2019-06-17 MED ORDER — POTASSIUM CHLORIDE CRYS ER 20 MEQ PO TBCR
40.0000 meq | EXTENDED_RELEASE_TABLET | ORAL | Status: AC
  Administered 2019-06-17 (×2): 40 meq via ORAL
  Filled 2019-06-17 (×2): qty 2

## 2019-06-17 MED ORDER — OXYCODONE-ACETAMINOPHEN 5-325 MG PO TABS
1.0000 | ORAL_TABLET | Freq: Once | ORAL | Status: AC
  Administered 2019-06-17: 1 via ORAL
  Filled 2019-06-17: qty 1

## 2019-06-17 MED ORDER — ACETAMINOPHEN 325 MG PO TABS
650.0000 mg | ORAL_TABLET | Freq: Four times a day (QID) | ORAL | Status: DC | PRN
  Administered 2019-06-17 – 2019-06-23 (×8): 650 mg via ORAL
  Filled 2019-06-17 (×8): qty 2

## 2019-06-17 MED ORDER — ADULT MULTIVITAMIN W/MINERALS CH
1.0000 | ORAL_TABLET | Freq: Every day | ORAL | Status: DC
  Administered 2019-06-20 – 2019-06-23 (×4): 1 via ORAL
  Filled 2019-06-17 (×5): qty 1

## 2019-06-17 MED ORDER — SODIUM PHOSPHATES 45 MMOLE/15ML IV SOLN
30.0000 mmol | Freq: Once | INTRAVENOUS | Status: DC
  Filled 2019-06-17: qty 10

## 2019-06-17 NOTE — Progress Notes (Signed)
Patient transferred to [facility] room 14 report given to Lisa,Rn.

## 2019-06-17 NOTE — Progress Notes (Signed)
Pharmacy Antibiotic Note  Theodore Brown is a 54 y.o. male admitted on 06/16/2019 s/p fall with neck pain along with paresthesias.  Patient was started on Rocephin for Streptococcus species bacteremia and Pharmacy now consulted to add vancomycin.  SCr 0.99, CrCL 75 ml/in, afebrile, WBC down to 16.4.  Plan: Vanc 1250mg  IV x 1, then 750mg  IV Q12H for AUC 477 using SCr 0.99 Rocephin 2gm IV Q24H per MD Monitor renal fxn, micro data, vanc AUC if not de-escalated by then  Height: 5\' 7"  (170.2 cm) Weight: 133 lb 8 oz (60.6 kg)(scale a) IBW/kg (Calculated) : 66.1  Temp (24hrs), Avg:98.7 F (37.1 C), Min:98.3 F (36.8 C), Max:98.9 F (37.2 C)  Recent Labs  Lab 06/16/19 1234 06/16/19 1344 06/16/19 1516 06/16/19 2350 06/17/19 0719  WBC 23.2*  --   --  17.7* 16.4*  CREATININE 1.34*  --   --  1.18 0.99  LATICACIDVEN  --  3.9* 3.1*  --   --     Estimated Creatinine Clearance: 74.8 mL/min (by C-G formula based on SCr of 0.99 mg/dL).    No Known Allergies  CTX 2/12 >> Vanc 2/12 >>  2/11 BCx - GPC (BCID Strep spp) 2/11 COVID: negative  Theodore Brown. 4/12, PharmD, BCPS, BCCCP 06/17/2019, 4:14 PM

## 2019-06-17 NOTE — Progress Notes (Signed)
Md paged ask about patient possibly needing to go to neuro/ortho floor await response. Charge nurse aware.

## 2019-06-17 NOTE — Progress Notes (Signed)
Echocardiogram 2D Echocardiogram has been performed.  Theodore Brown 06/17/2019, 1:14 PM

## 2019-06-17 NOTE — Progress Notes (Signed)
PHARMACY - PHYSICIAN COMMUNICATION CRITICAL VALUE ALERT - BLOOD CULTURE IDENTIFICATION (BCID)  Theodore Brown is an 54 y.o. male who presented to [facility] on 06/16/2019 with a chief complaint of AMS  Assessment:   2/2 blood cultures growing Staph species  Name of physician (or Provider) Contacted:  Dr. Alanda Slim  Current antibiotics: None  Changes to prescribed antibiotics recommended:  Start Rocephin 2 g IV q24h  Results for orders placed or performed during the hospital encounter of 06/16/19  Blood Culture ID Panel (Reflexed) (Collected: 06/16/2019  3:57 PM)  Result Value Ref Range   Enterococcus species NOT DETECTED NOT DETECTED   Listeria monocytogenes NOT DETECTED NOT DETECTED   Staphylococcus species NOT DETECTED NOT DETECTED   Staphylococcus aureus (BCID) NOT DETECTED NOT DETECTED   Streptococcus species DETECTED (A) NOT DETECTED   Streptococcus agalactiae NOT DETECTED NOT DETECTED   Streptococcus pneumoniae NOT DETECTED NOT DETECTED   Streptococcus pyogenes NOT DETECTED NOT DETECTED   Acinetobacter baumannii NOT DETECTED NOT DETECTED   Enterobacteriaceae species NOT DETECTED NOT DETECTED   Enterobacter cloacae complex NOT DETECTED NOT DETECTED   Escherichia coli NOT DETECTED NOT DETECTED   Klebsiella oxytoca NOT DETECTED NOT DETECTED   Klebsiella pneumoniae NOT DETECTED NOT DETECTED   Proteus species NOT DETECTED NOT DETECTED   Serratia marcescens NOT DETECTED NOT DETECTED   Haemophilus influenzae NOT DETECTED NOT DETECTED   Neisseria meningitidis NOT DETECTED NOT DETECTED   Pseudomonas aeruginosa NOT DETECTED NOT DETECTED   Candida albicans NOT DETECTED NOT DETECTED   Candida glabrata NOT DETECTED NOT DETECTED   Candida krusei NOT DETECTED NOT DETECTED   Candida parapsilosis NOT DETECTED NOT DETECTED   Candida tropicalis NOT DETECTED NOT DETECTED    [name] 06/17/2019  6:37 AM

## 2019-06-17 NOTE — Progress Notes (Signed)
C-Collar applied to patient via PT to immobilize his neck,he has neck fx has consult pending with Neurosurgeon. His neuro checks remain negative no acute distress noted with them. Patient reports pain relief with immobilizer and prn Morphine. His scds remain intact. He conts with condom cath due to bedrest. His appetite remains good. Mom has called check on been updated.

## 2019-06-17 NOTE — Consult Note (Signed)
[facility]    Date of Admission:  06/16/2019     Total days of antibiotics                Reason for Consult: Bacteremia   Referring Provider: Alanda Slim Primary Care Provider: Loletta Specter, PA-C   ASSESSMENT:  Theodore Brown is a 54 year old male with Streptococcus species bacteremia with acute cervical spine injury not requiring surgery following a mechanical fall likely associated with alcohol.  Primary source of infection unclear at present although does have poor dentition.  TTE negative for endocarditis.  Will need to repeat blood cultures to ensure clearance of bacteremia.  Continue current dose of ceftriaxone and add vancomycin pending species identification and sensitivities.  Healthcare maintenance shows HIV testing negative with previous hepatitis C testing on 01/2017 being negative  PLAN:  1. Continue current dose of ceftriaxone and add vancomycin. 2. Monitor renal function for nephrotoxicity while on vancomycin. 3. Repeat blood cultures to ensure clearance of bacteremia. 4. Continue cervical collar for stabilization per neurosurgery. 5. Electrolyte and mineral replacement per primary team.   Principal Problem:   Bacteremia due to Streptococcus Active Problems:   SIRS (systemic inflammatory response syndrome) (HCC)   Alcoholic liver disease (HCC)   Injury of cervical spine (HCC)   . ferrous sulfate  325 mg Oral BID WC  . folic acid  5 mg Oral Daily  . multivitamin with minerals  1 tablet Oral Daily  . thiamine  100 mg Oral Daily   Or  . thiamine  100 mg Intravenous Daily     HPI: Theodore Brown is a 54 y.o. male with previous medical history of alcohol abuse complicated by liver cirrhosis and previous neck surgery presenting with neck pain and bilateral upper extremity paresthesia following a fall 2 days prior to arrival.  He describes falling backwards and hitting his head without loss of consciousness.  Theodore Brown was  afebrile, tachycardic, and hypotensive in the ED which improved with fluids.  Blood work significant for hemoglobin of 7, white blood cell count of 23.2, hyponatremia with sodium 124, and creatinine of 1.34.  CT of the head and neck without acute findings.  CTA chest/abdomen/pelvis with cirrhosis of the liver with bulky abdominal varices, splenomegaly, and trace ascites with chronic bilateral rib fractures.  MRI of the cervical spine with acute cervical spine injury superimposed on severe chronic spinal degeneration and evidence of a traumatic disruption of the bilateral C2-C3 facets and suspected superimposed traumatic disruption of C5-C6 disc space.  There is also subsequent dorsal blood or fluid within spinal canal from C2-C3 to C6-C7 contributing to spinal stenosis with cord mass-effect at those levels.  Neurosurgery evaluation recommending conservative treatment without intervention as he is not a good surgical candidate.  Theodore Brown has been afebrile since admission with stable leukocytosis with most recent white blood cell count of 16.4.  Blood cultures drawn on admission positive for gram-positive cocci in chains with BCID identification of Streptococcus species (excluding Enterococcus, Streptococcus agalactiae, Streptococcus pyogenes or Streptococcus pneumoniae).  Theodore Brown continues to have neck pain and bilateral paresthesias.  Denies fevers, chills, or sweats.  TTE completed today with no obvious vegetations or signs of bacterial endocarditis.  He was started on ceftriaxone today.  ID has been consulted for antimicrobial recommendations.   Review of Systems: Review of Systems  Constitutional: Negative for chills, fever and weight loss.  Respiratory: Negative for cough, shortness of breath and wheezing.  Cardiovascular: Negative for chest pain and leg swelling.  Gastrointestinal: Negative for abdominal pain, constipation, diarrhea, nausea and vomiting.  Musculoskeletal: Positive for neck  pain.       Positive for bilateral paresthesias  Skin: Negative for rash.     Past Medical History:  Diagnosis Date  . Alcoholic (HCC)   . Anxiety   . Arthritis     Social History   Tobacco Use  . Smoking status: Never Smoker  . Smokeless tobacco: Never Used  Substance Use Topics  . Alcohol use: Yes    Alcohol/week: 5.0 standard drinks    Types: 5 Cans of beer per week    Comment: drank 2 beers today   . Drug use: Yes    Types: Marijuana    Comment: smoke marijuana once-twice a week    History reviewed. No pertinent family history.  No Known Allergies  OBJECTIVE: Blood pressure (!) 117/57, pulse 99, temperature 98.9 F (37.2 C), temperature source Oral, resp. rate 20, height 5\' 7"  (1.702 m), weight 60.6 kg, SpO2 100 %.  Physical Exam Constitutional:      General: He is not in acute distress.    Appearance: He is well-developed.     Interventions: Cervical collar in place.     Comments: Lying in bed; pleasant  Cardiovascular:     Rate and Rhythm: Normal rate and regular rhythm.     Heart sounds: Normal heart sounds.  Pulmonary:     Effort: Pulmonary effort is normal.     Breath sounds: Normal breath sounds.  Skin:    General: Skin is warm and dry.  Neurological:     Mental Status: He is alert and oriented to person, place, and time.  Psychiatric:        Behavior: Behavior normal.        Thought Content: Thought content normal.        Judgment: Judgment normal.     Lab Results Lab Results  Component Value Date   WBC 16.4 (H) 06/17/2019   HGB 7.5 (L) 06/17/2019   HCT 23.9 (L) 06/17/2019   MCV 66.6 (L) 06/17/2019   PLT 50 (L) 06/17/2019    Lab Results  Component Value Date   CREATININE 0.99 06/17/2019   BUN 20 06/17/2019   NA 128 (L) 06/17/2019   K 3.4 (L) 06/17/2019   CL 97 (L) 06/17/2019   CO2 23 06/17/2019    Lab Results  Component Value Date   ALT 41 06/17/2019   AST 88 (H) 06/17/2019   ALKPHOS 85 06/17/2019   BILITOT 2.6 (H)  06/17/2019     Microbiology: Recent Results (from the past 240 hour(s))  Respiratory Panel by RT PCR (Flu A&B, Covid) - Nasopharyngeal Swab     Status: None   Collection Time: 06/16/19  2:07 PM   Specimen: Nasopharyngeal Swab  Result Value Ref Range Status   SARS Coronavirus 2 by RT PCR NEGATIVE NEGATIVE Final    Comment: (NOTE) SARS-CoV-2 target nucleic acids are NOT DETECTED. The SARS-CoV-2 RNA is generally detectable in upper respiratoy specimens during the acute phase of infection. The lowest concentration of SARS-CoV-2 viral copies this assay can detect is 131 copies/mL. A negative result does not preclude SARS-Cov-2 infection and should not be used as the sole basis for treatment or other patient management decisions. A negative result may occur with  improper specimen collection/handling, submission of specimen other than nasopharyngeal swab, presence of viral mutation(s) within the areas targeted by this assay, and  inadequate number of viral copies (<131 copies/mL). A negative result must be combined with clinical observations, patient history, and epidemiological information. The expected result is Negative. Fact Sheet for Patients:  PinkCheek.be Fact Sheet for Healthcare Providers:  GravelBags.it This test is not yet ap proved or cleared by the [place] FDA and  has been authorized for detection and/or diagnosis of SARS-CoV-2 by FDA under an Emergency Use Authorization (EUA). This EUA will remain  in effect (meaning this test can be used) for the duration of the COVID-19 declaration under Section 564(b)(1) of the Act, 21 U.S.C. section 360bbb-3(b)(1), unless the authorization is terminated or revoked sooner.    Influenza A by PCR NEGATIVE NEGATIVE Final   Influenza B by PCR NEGATIVE NEGATIVE Final    Comment: (NOTE) The Xpert Xpress SARS-CoV-2/FLU/RSV assay is intended as an aid in  the diagnosis of  influenza from Nasopharyngeal swab specimens and  should not be used as a sole basis for treatment. Nasal washings and  aspirates are unacceptable for Xpert Xpress SARS-CoV-2/FLU/RSV  testing. Fact Sheet for Patients: PinkCheek.be Fact Sheet for Healthcare Providers: GravelBags.it This test is not yet approved or cleared by the [place] FDA and  has been authorized for detection and/or diagnosis of SARS-CoV-2 by  FDA under an Emergency Use Authorization (EUA). This EUA will remain  in effect (meaning this test can be used) for the duration of the  Covid-19 declaration under Section 564(b)(1) of the Act, 21  U.S.C. section 360bbb-3(b)(1), unless the authorization is  terminated or revoked. Performed at [facility], [place] [address] [place], [place] [address]   Culture, blood (routine x 2)     Status: None (Preliminary result)   Collection Time: 06/16/19  3:50 PM   Specimen: BLOOD RIGHT ARM  Result Value Ref Range Status   Specimen Description BLOOD RIGHT ARM  Final   Special Requests   Final    BOTTLES DRAWN AEROBIC AND ANAEROBIC Blood Culture adequate volume   Culture  Setup Time   Final    GRAM POSITIVE COCCI IN CHAINS IN BOTH AEROBIC AND ANAEROBIC BOTTLES CRITICAL RESULT CALLED TO, READ BACK BY AND VERIFIED WITH: Salli Real [id number] 06/17/2019 Mena Goes Performed at [facility], [place] [address], [place], [place] [address]    Culture GRAM POSITIVE COCCI  Final   Report Status PENDING  Incomplete  Culture, blood (routine x 2)     Status: None (Preliminary result)   Collection Time: 06/16/19  3:57 PM   Specimen: BLOOD RIGHT ARM  Result Value Ref Range Status   Specimen Description BLOOD RIGHT ARM  Final   Special Requests   Final    BOTTLES DRAWN AEROBIC AND ANAEROBIC Blood Culture adequate volume   Culture  Setup Time   Final    GRAM POSITIVE COCCI IN CHAINS IN BOTH AEROBIC AND ANAEROBIC  BOTTLES CRITICAL RESULT CALLED TO, READ BACK BY AND VERIFIED WITH: Salli Real [id number] 06/17/2019 Mena Goes Performed at [facility], [place] [address] [place], [place] [address]    Culture GRAM POSITIVE COCCI  Final   Report Status PENDING  Incomplete  Blood Culture ID Panel (Reflexed)     Status: Abnormal   Collection Time: 06/16/19  3:57 PM  Result Value Ref Range Status   Enterococcus species NOT DETECTED NOT DETECTED Final   Listeria monocytogenes NOT DETECTED NOT DETECTED Final   Staphylococcus species NOT DETECTED NOT DETECTED Final   Staphylococcus aureus (BCID) NOT DETECTED NOT  DETECTED Final   Streptococcus species DETECTED (A) NOT DETECTED Final    Comment: Not Enterococcus species, Streptococcus agalactiae, Streptococcus pyogenes, or Streptococcus pneumoniae. CRITICAL RESULT CALLED TO, READ BACK BY AND VERIFIED WITH: G. ABBOTT,PHARMD [id number] 06/17/2019 T. TYSOR    Streptococcus agalactiae NOT DETECTED NOT DETECTED Final   Streptococcus pneumoniae NOT DETECTED NOT DETECTED Final   Streptococcus pyogenes NOT DETECTED NOT DETECTED Final   Acinetobacter baumannii NOT DETECTED NOT DETECTED Final   Enterobacteriaceae species NOT DETECTED NOT DETECTED Final   Enterobacter cloacae complex NOT DETECTED NOT DETECTED Final   Escherichia coli NOT DETECTED NOT DETECTED Final   Klebsiella oxytoca NOT DETECTED NOT DETECTED Final   Klebsiella pneumoniae NOT DETECTED NOT DETECTED Final   Proteus species NOT DETECTED NOT DETECTED Final   Serratia marcescens NOT DETECTED NOT DETECTED Final   Haemophilus influenzae NOT DETECTED NOT DETECTED Final   Neisseria meningitidis NOT DETECTED NOT DETECTED Final   Pseudomonas aeruginosa NOT DETECTED NOT DETECTED Final   Candida albicans NOT DETECTED NOT DETECTED Final   Candida glabrata NOT DETECTED NOT DETECTED Final   Candida krusei NOT DETECTED NOT DETECTED Final   Candida parapsilosis NOT DETECTED NOT DETECTED Final   Candida tropicalis NOT  DETECTED NOT DETECTED Final    Comment: Performed at [facility], [address] [place], [place] [address]  SARS CORONAVIRUS 2 (TAT 6-24 HRS) Nasopharyngeal Nasopharyngeal Swab     Status: None   Collection Time: 06/16/19  7:13 PM   Specimen: Nasopharyngeal Swab  Result Value Ref Range Status   SARS Coronavirus 2 NEGATIVE NEGATIVE Final    Comment: (NOTE) SARS-CoV-2 target nucleic acids are NOT DETECTED. The SARS-CoV-2 RNA is generally detectable in upper and lower respiratory specimens during the acute phase of infection. Negative results do not preclude SARS-CoV-2 infection, do not rule out co-infections with other pathogens, and should not be used as the sole basis for treatment or other patient management decisions. Negative results must be combined with clinical observations, patient history, and epidemiological information. The expected result is Negative. Fact Sheet for Patients: HairSlick.no Fact Sheet for Healthcare Providers: quierodirigir.com This test is not yet approved or cleared by the [place] FDA and  has been authorized for detection and/or diagnosis of SARS-CoV-2 by FDA under an Emergency Use Authorization (EUA). This EUA will remain  in effect (meaning this test can be used) for the duration of the COVID-19 declaration under Section 56 4(b)(1) of the Act, 21 U.S.C. section 360bbb-3(b)(1), unless the authorization is terminated or revoked sooner. Performed at [facility], [address], [place], [place] [address]      Marcos Eke, NP [facility] [phone] Pager  06/17/2019  4:15 PM

## 2019-06-17 NOTE — Progress Notes (Signed)
Md informed of elevated temp 102.3 He has orders for IVPB Vancomycin in addition to Rocephin IVPB he had previously ordered. MD informed of critical phos level he ordered K Phos be given IVPB.  He is aware of his hgb 7.5 and platelette level 50. MD aware of lactic acid level 3.1. No further changes noted.

## 2019-06-17 NOTE — Progress Notes (Signed)
PROGRESS NOTE  Theodore Brown ZOX:[id number] DOB: January 21, 1966   PCP: Loletta Specter, PA-C  Patient is from: Home.  DOA: 06/16/2019 LOS: 1  Brief Narrative / Interim history: 54 year old male with history of alcohol abuse, liver cirrhosis, osteoarthritis and prior neck surgery presenting with neck pain and bilateral upper extremity paresthesia after fall 2 days ago.  Reportedly fell backward and hit his head without loss of consciousness.  Tred to manage his neck pain at home before he presented to ED.  In ED, hypotensive to 86/58 and tachycardic to 122.  But improved with IV fluid.  Afebrile.  Normal saturation on room air.  Hgb 7 (baseline 10.2).  MCV 66.  WBC 23.2. Na 124.  K3.4.  Cr 1.34 (b/l 0.92).  EKG LVH with bilateral atrial enlargement.  CTA head and neck without acute finding but degenerative disc and facet disease.  CTA chest/abdomen/pelvis revealed cirrhotic liver with bulky abdominal varices, splenomegaly, trace ascites and chronic bilateral rib fractures.  MRI cervical spine ordered.  Patient was admitted for SIRS, acute encephalopathy, volume depletion, cervical radiculopathy and electrolyte derangement.  The next morning, blood cultures grew GPC in chains.  Started on IV ceftriaxone.  Repeat blood culture ordered to be drawn at 24 hours after antibiotic initiation.  Echocardiogram ordered.  MRI cervical spine revealed acute cervical spine injury with evidence of bilateral C2-C3 facet disruption, C5-C6 disc space disruption, blood or fluid within the spinal canal from C2-C3 to C6-C7 contributing to spinal stenosis with cord mass-effect but no definite spinal cord contusion or edema and bulky prevertebral blood or fluid suspicious for anterior ligamentous injury.  Neurosurgery consulted and recommended Aspen collar, conservative management and outpatient follow-up in 6 weeks for flexion-extension C-spine x-rays.  Per neurosurgery, patient is not a great surgical  candidate.  Subjective: No major events overnight or this morning.  Complains pain in his neck.  Continues to endorse bilateral paresthesia.  Denies headache, weakness in his arm, bowel or bladder issue.  He denies chest pain, dyspnea, GI or UTI symptoms.  Objective: Vitals:   06/17/19 0052 06/17/19 0519 06/17/19 0735 06/17/19 1235  BP: 115/63 116/60 (!) 122/58 (!) 117/57  Pulse: 91 93 92 99  Resp:  17 18 20   Temp: 98.9 F (37.2 C) 98.3 F (36.8 C) 98.9 F (37.2 C) 98.9 F (37.2 C)  TempSrc:  Oral Oral Oral  SpO2: 99% 98% 96% 100%  Weight:      Height:        Intake/Output Summary (Last 24 hours) at 06/17/2019 1325 Last data filed at 06/17/2019 1000 Gross per 24 hour  Intake 1430 ml  Output 800 ml  Net 630 ml   Filed Weights   06/16/19 1228 06/17/19 0030  Weight: 63.5 kg 60.6 kg    Examination:  GENERAL: No acute distress.  Appears well.  HEENT: MMM.  Vision and hearing grossly intact.  NECK: Tenderness over cervical paraspinal muscles. RESP:  No IWOB. Good air movement bilaterally. CVS:  RRR. Heart sounds normal.  ABD/GI/GU: Bowel sounds present. Soft. Non tender.  MSK/EXT:  Moves extremities. No apparent deformity. No edema.  SKIN: no apparent skin lesion or wound NEURO: Awake, alert and oriented appropriately.  Motor 5/5 in both upper extremities and 4/5 in both lower extremities PSYCH: Calm. Normal affect.   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 -  Pain control -SCD for VTE prophylaxis. -PT/OT eval  Severe sepsis due to bacteremia: hypotensive, tachycardic with leukocytosis and lactic acidosis on admission.  Blood culture with GPCs any changes.  Suspect dental origin.  Denies IVDU.  TTE without significant finding of  vegetation. -Continue ceftriaxone -Repeat blood culture in the morning -ID consult -Recheck lactic acid.   Alcoholic liver cirrhosis/varices/splenomegaly Alcohol use disorder-admits drinking 3-4 beers a day. Elevated liver enzyme/hyperbilirubinemia-likely due to alcohol -Encourage cessation -Hepatitis panel.  HIV negative. -CIWA with as needed Ativan -Continue banana bag and vitamins.  Acute kidney injury: Likely due to sepsis and dehydration.  Resolved.  Hyponatremia/hypokalemia/hypophosphatemia: Likely due to alcohol abuse. -Replenish and recheck  Iron deficiency anemia: Baseline Hgb 8-9> 7.0 (admit) > 7.9.  Likely due to alcohol. -IV Feraheme -Continue monitoring.  Leukocytosis/bandemia: Likely due to bacteremia. -Continue monitoring   Thrombocytopenia: Likely due to alcohol. -Continue monitoring.               DVT prophylaxis: SCD due to epidural bleed and thrombocytopenia Code Status: Full code Family Communication: Patient and/or RN. Available if any question.   Discharge barrier: Sepsis/bacteremia/electrolyte derangement Patient is from: Home Final disposition: To be determined after PT/OT eval  Consultants: Neurosurgery   Microbiology summarized: COVID-19 negative. Influenza PCR negative. Blood cultures GPC in chains in 4 out of 4 bottles.  Sch Meds:  Scheduled Meds: . ferrous sulfate  325 mg Oral BID WC  . folic acid  5 mg Oral Daily  . multivitamin with minerals  1 tablet Oral Daily  . potassium chloride  40 mEq Oral Q4H  . thiamine  100 mg Oral Daily   Or  . thiamine  100 mg Intravenous Daily   Continuous Infusions: . cefTRIAXone (ROCEPHIN)  IV 2 g (06/17/19 0955)  . [START ON 06/18/2019] banana bag IV 1000 mL     PRN Meds:.acetaminophen, iohexol, LORazepam **OR** LORazepam, morphine injection  Antimicrobials: Anti-infectives (From admission, onward)   Start     Dose/Rate Route Frequency Ordered Stop   06/17/19 0800  cefTRIAXone  (ROCEPHIN) 2 g in sodium chloride 0.9 % 100 mL IVPB     2 g 200 mL/hr over 30 Minutes Intravenous Every 24 hours 06/17/19 0639         I have personally reviewed the following labs and images: CBC: Recent Labs  Lab 06/16/19 1234 06/16/19 2350 06/17/19 0719  WBC 23.2* 17.7* 16.4*  HGB 7.0* 7.9* 7.7*  HCT 23.6* 25.5* 24.5*  MCV 65.7* 67.6* 66.6*  PLT 52* 51* 50*   BMP &GFR Recent Labs  Lab 06/16/19 1234 06/16/19 2350 06/17/19 0719 06/17/19 1046  NA 124* 125* 128*  --   K 3.4* 3.3* 3.4*  --   CL 91* 93* 97*  --   CO2 22 22 23   --   GLUCOSE 96 82 89  --   BUN 22* 22* 20  --   CREATININE 1.34* 1.18 0.99  --   CALCIUM 8.1* 7.9* 7.7*  --   MG  --  2.2  --  2.0  PHOS  --  1.7*  --  <1.0*   Estimated Creatinine Clearance: 74.8 mL/min (by C-G formula based on SCr of 0.99 mg/dL). Liver & Pancreas: Recent Labs  Lab 06/16/19 1234 06/16/19 2350 06/17/19 0719  AST 104* 96* 88*  ALT 45* 40 41  ALKPHOS 85 92 85  BILITOT 2.4* 3.1* 2.6*  PROT 6.6 6.1* 5.9*  ALBUMIN 2.1* 2.0* 1.8*   No results for input(s): LIPASE, AMYLASE  in the last 168 hours. Recent Labs  Lab 06/16/19 1344  AMMONIA 30   Diabetic: No results for input(s): HGBA1C in the last 72 hours. Recent Labs  Lab 06/16/19 1350 06/17/19 0605  GLUCAP 96 89   Cardiac Enzymes: No results for input(s): CKTOTAL, CKMB, CKMBINDEX, TROPONINI in the last 168 hours. No results for input(s): PROBNP in the last 8760 hours. Coagulation Profile: Recent Labs  Lab 06/16/19 1344 06/17/19 1046  INR 1.5* 1.4*   Thyroid Function Tests: Recent Labs    06/16/19 2350  TSH 1.769   Lipid Profile: No results for input(s): CHOL, HDL, LDLCALC, TRIG, CHOLHDL, LDLDIRECT in the last 72 hours. Anemia Panel: Recent Labs    06/16/19 2350 06/17/19 1046  VITAMINB12 824 1,271*  FERRITIN  --  85  TIBC  --  351  IRON  --  42*  RETICCTPCT  --  1.6   Urine analysis:    Component Value Date/Time   COLORURINE AMBER (A)  06/16/2019 1317   APPEARANCEUR CLEAR 06/16/2019 1317   LABSPEC 1.019 06/16/2019 1317   PHURINE 5.0 06/16/2019 1317   GLUCOSEU NEGATIVE 06/16/2019 1317   HGBUR NEGATIVE 06/16/2019 1317   BILIRUBINUR NEGATIVE 06/16/2019 1317   BILIRUBINUR small 01/28/2017 1422   KETONESUR NEGATIVE 06/16/2019 1317   PROTEINUR NEGATIVE 06/16/2019 1317   UROBILINOGEN >=8.0 (A) 01/28/2017 1422   UROBILINOGEN 0.2 01/05/2013 0716   NITRITE NEGATIVE 06/16/2019 1317   LEUKOCYTESUR NEGATIVE 06/16/2019 1317   Sepsis Labs: Invalid input(s): PROCALCITONIN, LACTICIDVEN  Microbiology: Recent Results (from the past 240 hour(s))  Respiratory Panel by RT PCR (Flu A&B, Covid) - Nasopharyngeal Swab     Status: None   Collection Time: 06/16/19  2:07 PM   Specimen: Nasopharyngeal Swab  Result Value Ref Range Status   SARS Coronavirus 2 by RT PCR NEGATIVE NEGATIVE Final    Comment: (NOTE) SARS-CoV-2 target nucleic acids are NOT DETECTED. The SARS-CoV-2 RNA is generally detectable in upper respiratoy specimens during the acute phase of infection. The lowest concentration of SARS-CoV-2 viral copies this assay can detect is 131 copies/mL. A negative result does not preclude SARS-Cov-2 infection and should not be used as the sole basis for treatment or other patient management decisions. A negative result may occur with  improper specimen collection/handling, submission of specimen other than nasopharyngeal swab, presence of viral mutation(s) within the areas targeted by this assay, and inadequate number of viral copies (<131 copies/mL). A negative result must be combined with clinical observations, patient history, and epidemiological information. The expected result is Negative. Fact Sheet for Patients:  https://www.moore.com/https://www.fda.gov/media/142436/download Fact Sheet for Healthcare Providers:  https://www.young.biz/https://www.fda.gov/media/142435/download This test is not yet ap proved or cleared by the [place] FDA and  has been authorized  for detection and/or diagnosis of SARS-CoV-2 by FDA under an Emergency Use Authorization (EUA). This EUA will remain  in effect (meaning this test can be used) for the duration of the COVID-19 declaration under Section 564(b)(1) of the Act, 21 U.S.C. section 360bbb-3(b)(1), unless the authorization is terminated or revoked sooner.    Influenza A by PCR NEGATIVE NEGATIVE Final   Influenza B by PCR NEGATIVE NEGATIVE Final    Comment: (NOTE) The Xpert Xpress SARS-CoV-2/FLU/RSV assay is intended as an aid in  the diagnosis of influenza from Nasopharyngeal swab specimens and  should not be used as a sole basis for treatment. Nasal washings and  aspirates are unacceptable for Xpert Xpress SARS-CoV-2/FLU/RSV  testing. Fact Sheet for Patients: https://www.moore.com/https://www.fda.gov/media/142436/download Fact Sheet for Healthcare Providers:  GravelBags.it This test is not yet approved or cleared by the [place] and  has been authorized for detection and/or diagnosis of SARS-CoV-2 by  FDA under an Emergency Use Authorization (EUA). This EUA will remain  in effect (meaning this test can be used) for the duration of the  Covid-19 declaration under Section 564(b)(1) of the Act, 21  U.S.C. section 360bbb-3(b)(1), unless the authorization is  terminated or revoked. Performed at [facility], [place] [address], [place], [place] [address]   Culture, blood (routine x 2)     Status: None (Preliminary result)   Collection Time: 06/16/19  3:50 PM   Specimen: BLOOD RIGHT ARM  Result Value Ref Range Status   Specimen Description BLOOD RIGHT ARM  Final   Special Requests   Final    BOTTLES DRAWN AEROBIC AND ANAEROBIC Blood Culture adequate volume   Culture  Setup Time   Final    GRAM POSITIVE COCCI IN CHAINS IN BOTH AEROBIC AND ANAEROBIC BOTTLES CRITICAL RESULT CALLED TO, READ BACK BY AND VERIFIED WITH: Salli Real [id number] 06/17/2019 Mena Goes Performed at [place], [place] [address], [place], [place] [address]    Culture GRAM POSITIVE COCCI  Final   Report Status PENDING  Incomplete  Culture, blood (routine x 2)     Status: None (Preliminary result)   Collection Time: 06/16/19  3:57 PM   Specimen: BLOOD RIGHT ARM  Result Value Ref Range Status   Specimen Description BLOOD RIGHT ARM  Final   Special Requests   Final    BOTTLES DRAWN AEROBIC AND ANAEROBIC Blood Culture adequate volume   Culture  Setup Time   Final    GRAM POSITIVE COCCI IN CHAINS IN BOTH AEROBIC AND ANAEROBIC BOTTLES CRITICAL RESULT CALLED TO, READ BACK BY AND VERIFIED WITH: Salli Real [id number] 06/17/2019 Mena Goes Performed at [facility], [place] [address], [place], [place] [address]    Culture GRAM POSITIVE COCCI  Final   Report Status PENDING  Incomplete  Blood Culture ID Panel (Reflexed)     Status: Abnormal   Collection Time: 06/16/19  3:57 PM  Result Value Ref Range Status   Enterococcus species NOT DETECTED NOT DETECTED Final   Listeria monocytogenes NOT DETECTED NOT DETECTED Final   Staphylococcus species NOT DETECTED NOT DETECTED Final   Staphylococcus aureus (BCID) NOT DETECTED NOT DETECTED Final   Streptococcus species DETECTED (A) NOT DETECTED Final    Comment: Not Enterococcus species, Streptococcus agalactiae, Streptococcus pyogenes, or Streptococcus pneumoniae. CRITICAL RESULT CALLED TO, READ BACK BY AND VERIFIED WITH: G. ABBOTT,PHARMD [id number] 06/17/2019 T. TYSOR    Streptococcus agalactiae NOT DETECTED NOT DETECTED Final   Streptococcus pneumoniae NOT DETECTED NOT DETECTED Final   Streptococcus pyogenes NOT DETECTED NOT DETECTED Final   Acinetobacter baumannii NOT DETECTED NOT DETECTED Final   Enterobacteriaceae species NOT DETECTED NOT DETECTED Final   Enterobacter cloacae complex NOT DETECTED NOT DETECTED Final   Escherichia coli NOT DETECTED NOT DETECTED Final   Klebsiella oxytoca NOT DETECTED NOT DETECTED Final   Klebsiella pneumoniae NOT DETECTED NOT  DETECTED Final   Proteus species NOT DETECTED NOT DETECTED Final   Serratia marcescens NOT DETECTED NOT DETECTED Final   Haemophilus influenzae NOT DETECTED NOT DETECTED Final   Neisseria meningitidis NOT DETECTED NOT DETECTED Final   Pseudomonas aeruginosa NOT DETECTED NOT DETECTED Final   Candida albicans NOT DETECTED NOT DETECTED Final   Candida glabrata NOT DETECTED NOT DETECTED Final  Candida krusei NOT DETECTED NOT DETECTED Final   Candida parapsilosis NOT DETECTED NOT DETECTED Final   Candida tropicalis NOT DETECTED NOT DETECTED Final    Comment: Performed at [facility], [address], [place], [place] [address]  SARS CORONAVIRUS 2 (TAT 6-24 HRS) Nasopharyngeal Nasopharyngeal Swab     Status: None   Collection Time: 06/16/19  7:13 PM   Specimen: Nasopharyngeal Swab  Result Value Ref Range Status   SARS Coronavirus 2 NEGATIVE NEGATIVE Final    Comment: (NOTE) SARS-CoV-2 target nucleic acids are NOT DETECTED. The SARS-CoV-2 RNA is generally detectable in upper and lower respiratory specimens during the acute phase of infection. Negative results do not preclude SARS-CoV-2 infection, do not rule out co-infections with other pathogens, and should not be used as the sole basis for treatment or other patient management decisions. Negative results must be combined with clinical observations, patient history, and epidemiological information. The expected result is Negative. Fact Sheet for Patients: HairSlick.no Fact Sheet for Healthcare Providers: quierodirigir.com This test is not yet approved or cleared by the [place] FDA and  has been authorized for detection and/or diagnosis of SARS-CoV-2 by FDA under an Emergency Use Authorization (EUA). This EUA will remain  in effect (meaning this test can be used) for the duration of the COVID-19 declaration under Section 56 4(b)(1) of the Act, 21 U.S.C. section  360bbb-3(b)(1), unless the authorization is terminated or revoked sooner. Performed at [facility], [address], [place], [place] [address]     Radiology Studies: CT Head Wo Contrast  Result Date: 06/16/2019 CLINICAL DATA:  Fall. EXAM: CT HEAD WITHOUT CONTRAST CT CERVICAL SPINE WITHOUT CONTRAST TECHNIQUE: Multidetector CT imaging of the head and cervical spine was performed following the standard protocol without intravenous contrast. Multiplanar CT image reconstructions of the cervical spine were also generated. COMPARISON:  03/03/2018 FINDINGS: CT HEAD FINDINGS Brain: No acute intracranial abnormality. Specifically, no hemorrhage, hydrocephalus, mass lesion, acute infarction, or significant intracranial injury. Vascular: No hyperdense vessel or unexpected calcification. Skull: No acute calvarial abnormality. Sinuses/Orbits: Visualized paranasal sinuses and mastoids clear. Orbital soft tissues unremarkable. Other: None CT CERVICAL SPINE FINDINGS Alignment: Anterolisthesis of C2 on C3 and C3 on C4 related to facet disease. Skull base and vertebrae: No acute fracture. No primary bone lesion or focal pathologic process. Soft tissues and spinal canal: No prevertebral fluid or swelling. No visible canal hematoma. Disc levels: Advanced degenerative disc disease from C3-4 through C6-7. Advanced diffuse bilateral degenerative facet disease. Upper chest: No acute findings Other: None IMPRESSION: No acute intracranial abnormality. Advanced degenerative disc and facet disease. No acute bony abnormality. Electronically Signed   By: Charlett Nose M.D.   On: 06/16/2019 17:54   CT Chest W Contrast  Result Date: 06/16/2019 CLINICAL DATA:  54 year old male status post fall. EXAM: CT CHEST, ABDOMEN, AND PELVIS WITH CONTRAST TECHNIQUE: Multidetector CT imaging of the chest, abdomen and pelvis was performed following the standard protocol during bolus administration of intravenous contrast. CONTRAST:   OMNIPAQUE IOHEXOL 300 MG/ML  SOLN COMPARISON:  Cervical spine CT today reported separately FINDINGS: CT CHEST FINDINGS Cardiovascular: Mild cardiac pulsation. The thoracic aorta appears intact. Borderline to mild cardiomegaly. No pericardial effusion. Calcified coronary artery atherosclerosis. Mediastinum/Nodes: Negative, no mediastinal hematoma or lymphadenopathy. Lungs/Pleura: Trace retained secretions in the trachea. Otherwise the major airways are patent. No pneumothorax. No pleural effusion. No pulmonary contusion. Dependent opacity most resembles atelectasis. Some areas of superimposed peripheral lung scarring are noted (left lingula  series 4, image 80). Musculoskeletal: Normal thoracic segmentation. Thoracic vertebrae appear intact. Chronic right posterolateral 7th 8th and 10th rib fractures. Chronic left lateral 3rd through 10th rib fractures (9th rib chronically fractured in 2 places). No acute rib fracture identified. Sternum intact. CT ABDOMEN PELVIS FINDINGS Hepatobiliary: Nodular and cirrhotic liver. No discrete liver lesion. Negative gallbladder. Trace perihepatic free fluid. Pancreas: Negative. Spleen: Splenomegaly. No discrete splenic lesion. Adrenals/Urinary Tract: Normal adrenal glands. Bilateral renal enhancement and contrast excretion is symmetric and within normal limits. Decompressed proximal ureters. Mildly distended but otherwise unremarkable urinary bladder. Stomach/Bowel: No dilated large or small bowel. Evidence of a normal appendix on coronal image 74. Mostly decompressed stomach. No free air. Only trace perihepatic free fluid in the abdomen. Vascular/Lymphatic: Aortoiliac calcified atherosclerosis. Major arterial structures in the abdomen and pelvis are patent. No lymphadenopathy. There are bulky gastrosplenic varices. Bulky recanalized paraumbilical vein and ventral abdominal wall varices. Diminutive but patent main portal vein. Reproductive: Negative. Other: There is trace pelvic free  fluid on series 9, image 108 with simple fluid density. Musculoskeletal: Normal lumbar segmentation. IMPRESSION: 1. No acute traumatic injury identified in the chest, abdomen, or pelvis. 2. Cirrhotic Liver with bulky abdominal varices, splenomegaly, and trace ascites in the abdomen and pelvis. 3. Chronic bilateral rib fractures. 4. Aortic Atherosclerosis (ICD10-I70.0). Calcified coronary artery atherosclerosis. Electronically Signed   By: Odessa Fleming M.D.   On: 06/16/2019 19:02   CT Cervical Spine Wo Contrast  Result Date: 06/16/2019 CLINICAL DATA:  Fall. EXAM: CT HEAD WITHOUT CONTRAST CT CERVICAL SPINE WITHOUT CONTRAST TECHNIQUE: Multidetector CT imaging of the head and cervical spine was performed following the standard protocol without intravenous contrast. Multiplanar CT image reconstructions of the cervical spine were also generated. COMPARISON:  03/03/2018 FINDINGS: CT HEAD FINDINGS Brain: No acute intracranial abnormality. Specifically, no hemorrhage, hydrocephalus, mass lesion, acute infarction, or significant intracranial injury. Vascular: No hyperdense vessel or unexpected calcification. Skull: No acute calvarial abnormality. Sinuses/Orbits: Visualized paranasal sinuses and mastoids clear. Orbital soft tissues unremarkable. Other: None CT CERVICAL SPINE FINDINGS Alignment: Anterolisthesis of C2 on C3 and C3 on C4 related to facet disease. Skull base and vertebrae: No acute fracture. No primary bone lesion or focal pathologic process. Soft tissues and spinal canal: No prevertebral fluid or swelling. No visible canal hematoma. Disc levels: Advanced degenerative disc disease from C3-4 through C6-7. Advanced diffuse bilateral degenerative facet disease. Upper chest: No acute findings Other: None IMPRESSION: No acute intracranial abnormality. Advanced degenerative disc and facet disease. No acute bony abnormality. Electronically Signed   By: Charlett Nose M.D.   On: 06/16/2019 17:54   MR CERVICAL SPINE WO  CONTRAST  Addendum Date: 06/17/2019   ADDENDUM REPORT: 06/17/2019 09:33 ADDENDUM: Study discussed by telephone with Dr. Alanda Slim on 06/17/2019 at 0931 hours. He is contacting Neurosurgery. Electronically Signed   By: Odessa Fleming M.D.   On: 06/17/2019 09:33   Result Date: 06/17/2019 CLINICAL DATA:  54 year old male with multiple recent falls. Severe neck pain. EXAM: MRI CERVICAL SPINE WITHOUT CONTRAST TECHNIQUE: Multiplanar, multisequence MR imaging of the cervical spine was performed. No intravenous contrast was administered. COMPARISON:  Cervical spine CT yesterday. FINDINGS: Alignment: Reversal of cervical lordosis and trace anterolisthesis of C2 on C3 is stable since yesterday. Vertebrae: There is marrow edema in the chronically degenerated right C2-C3 facet (series 7, image 3), and possible small acute fracture fragments from that facet are identified on series 10, image 23 yesterday. Associated abnormal fluid within that facet joint, and also the contralateral  left C2-C3 facet. See series 8, image 13. No marrow edema identified elsewhere, but there is also conspicuous increased fluid signal in the C5-C6 disc space (series 7, image 11. Cord: Abnormal dorsal intraspinal blood or fluid tracking from C2-C3 to C6-C7 and contributes to spinal stenosis at those levels (series 5, image 8, series 8, image 23, series 10, image 14). The fluid appears to abate at the cervicothoracic junction. There is superimposed cervical spinal cord mass effect but no definite cord edema or intra-axial blood products. Posterior Fossa, vertebral arteries, paraspinal tissues: Cervicomedullary junction is within normal limits. Negative visible posterior fossa. Preserved major vascular flow voids in the neck. Bulky abnormal prevertebral fluid or edema along the entire ventral cervical spine, up to 13 mm in thickness (series 5, image 8 and series 8, image 17). No definite discontinuity of the anterior longitudinal ligament is identified. This  also abates at the cervicothoracic junction. There is muscular edema around the right C2-C3 facet some, but no definite interspinous ligament injury. No left-side muscle edema identified. Disc levels: C2-C3: Mild anterolisthesis with severe right facet degeneration. Both facets are abnormal as stated earlier. Disc bulging is superimposed on dorsal spinal canal blood or fluid. Mild spinal stenosis and spinal cord mass effect. Severe right C3 foraminal stenosis. C3-C4: Mild anterolisthesis with disc, endplate, and posterior element degeneration. Dorsal canal blood or fluid. Mild spinal stenosis and spinal cord mass effect. Moderate to severe bilateral C4 foraminal stenosis. C4-C5: Severe disc space loss with circumferential disc osteophyte complex. Dorsal bladder fluid. Spinal stenosis with mild spinal cord mass effect. Mild to moderate bilateral C5 foraminal stenosis. C5-C6: Abnormal signal within the disc space, underlying severe disc space loss with bulky anterior endplate osteophytes. Decreasing dorsal blood or fluid at this level. Mild spinal stenosis and spinal cord mass effect. Mild to moderate bilateral C6 foraminal stenosis. C6-C7: Disc space loss with circumferential disc osteophyte complex. Mild residual spinal canal blood or fluid. Mild spinal stenosis and spinal cord mass effect. Moderate bilateral C7 foraminal stenosis. C7-T1: Mild anterolisthesis and moderate facet hypertrophy. No spinal stenosis at this level. Mild to moderate bilateral C8 foraminal stenosis. No upper thoracic spinal stenosis. IMPRESSION: 1. 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 2. Evidence of a traumatic disruption of the bilateral C2-C3 facets (probably with small fracture fragments on the right), and suspected superimposed traumatic disruption of the C5-C6 disc space, although no definite fracture at that level. 3. Subsequent dorsal blood or fluid within the spinal canal from C2-C3 to C6-C7,  contributing to spinal stenosis with cord mass effect at those levels. But no definite spinal cord contusion or edema. 4. Bulky prevertebral blood or fluid highly suspicious for anterior ligamentous injury, although no discrete ALL disruption is identified. 5. Recommend spine precautions and Neurosurgery consultation. Electronically Signed: By: Odessa FlemingH  Hall M.D. On: 06/17/2019 09:07   CT Abdomen Pelvis W Contrast  Result Date: 06/16/2019 CLINICAL DATA:  54 year old male status post fall. EXAM: CT CHEST, ABDOMEN, AND PELVIS WITH CONTRAST TECHNIQUE: Multidetector CT imaging of the chest, abdomen and pelvis was performed following the standard protocol during bolus administration of intravenous contrast. CONTRAST:  100mL OMNIPAQUE IOHEXOL 300 MG/ML  SOLN COMPARISON:  Cervical spine CT today reported separately FINDINGS: CT CHEST FINDINGS Cardiovascular: Mild cardiac pulsation. The thoracic aorta appears intact. Borderline to mild cardiomegaly. No pericardial effusion. Calcified coronary artery atherosclerosis. Mediastinum/Nodes: Negative, no mediastinal hematoma or lymphadenopathy. Lungs/Pleura: Trace retained secretions in the trachea. Otherwise the major airways are patent. No  pneumothorax. No pleural effusion. No pulmonary contusion. Dependent opacity most resembles atelectasis. Some areas of superimposed peripheral lung scarring are noted (left lingula series 4, image 80). Musculoskeletal: Normal thoracic segmentation. Thoracic vertebrae appear intact. Chronic right posterolateral 7th 8th and 10th rib fractures. Chronic left lateral 3rd through 10th rib fractures (9th rib chronically fractured in 2 places). No acute rib fracture identified. Sternum intact. CT ABDOMEN PELVIS FINDINGS Hepatobiliary: Nodular and cirrhotic liver. No discrete liver lesion. Negative gallbladder. Trace perihepatic free fluid. Pancreas: Negative. Spleen: Splenomegaly. No discrete splenic lesion. Adrenals/Urinary Tract: Normal adrenal  glands. Bilateral renal enhancement and contrast excretion is symmetric and within normal limits. Decompressed proximal ureters. Mildly distended but otherwise unremarkable urinary bladder. Stomach/Bowel: No dilated large or small bowel. Evidence of a normal appendix on coronal image 74. Mostly decompressed stomach. No free air. Only trace perihepatic free fluid in the abdomen. Vascular/Lymphatic: Aortoiliac calcified atherosclerosis. Major arterial structures in the abdomen and pelvis are patent. No lymphadenopathy. There are bulky gastrosplenic varices. Bulky recanalized paraumbilical vein and ventral abdominal wall varices. Diminutive but patent main portal vein. Reproductive: Negative. Other: There is trace pelvic free fluid on series 9, image 108 with simple fluid density. Musculoskeletal: Normal lumbar segmentation. IMPRESSION: 1. No acute traumatic injury identified in the chest, abdomen, or pelvis. 2. Cirrhotic Liver with bulky abdominal varices, splenomegaly, and trace ascites in the abdomen and pelvis. 3. Chronic bilateral rib fractures. 4. Aortic Atherosclerosis (ICD10-I70.0). Calcified coronary artery atherosclerosis. Electronically Signed   By: Odessa Fleming M.D.   On: 06/16/2019 19:02   45 minutes with more than 50% spent in reviewing records, counseling patient/family and coordinating care.   Dianara Smullen T. Juell Radney [facility]  If 7PM-7AM, please contact night-coverage [email or web address] Password [email or web address] 06/17/2019, 1:25 PM

## 2019-06-17 NOTE — Progress Notes (Signed)
PT Cancellation Note  Patient Details Name: Theodore Brown MRN: [id number] DOB: March 18, 1966   Cancelled Treatment:    Reason Eval/Treat Not Completed: Patient at procedure or test/unavailable Pt off floor at MRI. Will follow.   Theodore Brown 06/17/2019, 8:27 AM [place], PT, DPT Acute Rehabilitation Services Pager [phone] Office [phone]

## 2019-06-17 NOTE — Consult Note (Signed)
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 Advanced diffuse bilateral degenerative facet disease. Upper chest: No acute findings  Other: None IMPRESSION: No acute intracranial abnormality. Advanced degenerative disc and facet disease. No acute bony abnormality. Electronically Signed   By: Charlett Nose M.D.   On: 06/16/2019 17:54   CT Chest W  Contrast  Result Date: 06/16/2019 CLINICAL DATA:  54 year old male status post fall. EXAM: CT CHEST, ABDOMEN, AND PELVIS WITH CONTRAST TECHNIQUE: Multidetector CT imaging of the chest, abdomen and pelvis was performed following the standard protocol during bolus administration of intravenous contrast. CONTRAST:  OMNIPAQUE IOHEXOL 300 MG/ML  SOLN COMPARISON:  Cervical spine CT today reported separately FINDINGS: CT CHEST FINDINGS Cardiovascular: Mild cardiac pulsation. The thoracic aorta appears intact. Borderline to mild cardiomegaly. No pericardial effusion. Calcified coronary artery atherosclerosis. Mediastinum/Nodes: Negative, no mediastinal hematoma or lymphadenopathy. Lungs/Pleura: Trace retained secretions in the trachea. Otherwise the major airways are patent. No pneumothorax. No pleural effusion. No pulmonary contusion. Dependent opacity most resembles atelectasis. Some areas of superimposed peripheral lung scarring are noted (left lingula series 4, image 80). Musculoskeletal: Normal thoracic segmentation. Thoracic vertebrae appear intact. Chronic right posterolateral 7th 8th and 10th rib fractures. Chronic left lateral 3rd through 10th rib fractures (9th rib chronically fractured in 2 places). No acute rib fracture identified. Sternum intact. CT ABDOMEN PELVIS FINDINGS Hepatobiliary: Nodular and cirrhotic liver. No discrete liver lesion. Negative gallbladder. Trace perihepatic free fluid. Pancreas: Negative. Spleen: Splenomegaly. No discrete splenic lesion. Adrenals/Urinary Tract: Normal adrenal glands. Bilateral renal enhancement and contrast excretion is symmetric and within normal limits. Decompressed proximal ureters. Mildly distended but otherwise unremarkable urinary bladder. Stomach/Bowel: No dilated large or small bowel. Evidence of a normal appendix on coronal image 74. Mostly decompressed stomach. No free air. Only trace perihepatic free fluid in the abdomen. Vascular/Lymphatic: Aortoiliac  calcified atherosclerosis. Major arterial structures in the abdomen and pelvis are patent. No lymphadenopathy. There are bulky gastrosplenic varices. Bulky recanalized paraumbilical vein and ventral abdominal wall varices. Diminutive but patent main portal vein. Reproductive: Negative. Other: There is trace pelvic free fluid on series 9, image 108 with simple fluid density. Musculoskeletal: Normal lumbar segmentation. IMPRESSION: 1. No acute traumatic injury identified in the chest, abdomen, or pelvis. 2. Cirrhotic Liver with bulky abdominal varices, splenomegaly, and trace ascites in the abdomen and pelvis. 3. Chronic bilateral rib fractures. 4. Aortic Atherosclerosis (ICD10-I70.0). Calcified coronary artery atherosclerosis. Electronically Signed   By: Odessa Fleming M.D.   On: 06/16/2019 19:02   CT Cervical Spine Wo Contrast  Result Date: 06/16/2019 CLINICAL DATA:  Fall. EXAM: CT HEAD WITHOUT CONTRAST CT CERVICAL SPINE WITHOUT CONTRAST TECHNIQUE: Multidetector CT imaging of the head and cervical spine was performed following the standard protocol without intravenous contrast. Multiplanar CT image reconstructions of the cervical spine were also generated. COMPARISON:  03/03/2018 FINDINGS: CT HEAD FINDINGS Brain: No acute intracranial abnormality. Specifically, no hemorrhage, hydrocephalus, mass lesion, acute infarction, or significant intracranial injury. Vascular: No hyperdense vessel or unexpected calcification. Skull: No acute calvarial abnormality. Sinuses/Orbits: Visualized paranasal sinuses and mastoids clear. Orbital soft tissues unremarkable. Other: None CT CERVICAL SPINE FINDINGS Alignment: Anterolisthesis of C2 on C3 and C3 on C4 related to facet disease. Skull base and vertebrae: No acute fracture. No primary bone lesion or focal pathologic process. Soft tissues and spinal canal: No prevertebral fluid or swelling. No visible canal hematoma. Disc levels: Advanced degenerative disc disease from C3-4 through  C6-7. Advanced diffuse bilateral degenerative facet disease. Upper chest: No acute findings Other: None IMPRESSION: No acute intracranial abnormality. Advanced degenerative disc and facet disease. No acute bony abnormality. Electronically Signed  By: Charlett Nose M.D.   On: 06/16/2019 17:54   MR CERVICAL SPINE WO CONTRAST  Addendum Date: 06/17/2019   ADDENDUM REPORT: 06/17/2019 09:33 ADDENDUM: Study discussed by telephone with Dr. Alanda Slim on 06/17/2019 at 0931 hours. He is contacting Neurosurgery. Electronically Signed   By: Odessa Fleming M.D.   On: 06/17/2019 09:33   Result Date: 06/17/2019 CLINICAL DATA:  54 year old male with multiple recent falls. Severe neck pain. EXAM: MRI CERVICAL SPINE WITHOUT CONTRAST TECHNIQUE: Multiplanar, multisequence MR imaging of the cervical spine was performed. No intravenous contrast was administered. COMPARISON:  Cervical spine CT yesterday. FINDINGS: Alignment: Reversal of cervical lordosis and trace anterolisthesis of C2 on C3 is stable since yesterday. Vertebrae: There is marrow edema in the chronically degenerated right C2-C3 facet (series 7, image 3), and possible small acute fracture fragments from that facet are identified on series 10, image 23 yesterday. Associated abnormal fluid within that facet joint, and also the contralateral left C2-C3 facet. See series 8, image 13. No marrow edema identified elsewhere, but there is also conspicuous increased fluid signal in the C5-C6 disc space (series 7, image 11. Cord: Abnormal dorsal intraspinal blood or fluid tracking from C2-C3 to C6-C7 and contributes to spinal stenosis at those levels (series 5, image 8, series 8, image 23, series 10, image 14). The fluid appears to abate at the cervicothoracic junction. There is superimposed cervical spinal cord mass effect but no definite cord edema or intra-axial blood products. Posterior Fossa, vertebral arteries, paraspinal tissues: Cervicomedullary junction is within normal limits.  Negative visible posterior fossa. Preserved major vascular flow voids in the neck. Bulky abnormal prevertebral fluid or edema along the entire ventral cervical spine, up to 13 mm in thickness (series 5, image 8 and series 8, image 17). No definite discontinuity of the anterior longitudinal ligament is identified. This also abates at the cervicothoracic junction. There is muscular edema around the right C2-C3 facet some, but no definite interspinous ligament injury. No left-side muscle edema identified. Disc levels: C2-C3: Mild anterolisthesis with severe right facet degeneration. Both facets are abnormal as stated earlier. Disc bulging is superimposed on dorsal spinal canal blood or fluid. Mild spinal stenosis and spinal cord mass effect. Severe right C3 foraminal stenosis. C3-C4: Mild anterolisthesis with disc, endplate, and posterior element degeneration. Dorsal canal blood or fluid. Mild spinal stenosis and spinal cord mass effect. Moderate to severe bilateral C4 foraminal stenosis. C4-C5: Severe disc space loss with circumferential disc osteophyte complex. Dorsal bladder fluid. Spinal stenosis with mild spinal cord mass effect. Mild to moderate bilateral C5 foraminal stenosis. C5-C6: Abnormal signal within the disc space, underlying severe disc space loss with bulky anterior endplate osteophytes. Decreasing dorsal blood or fluid at this level. Mild spinal stenosis and spinal cord mass effect. Mild to moderate bilateral C6 foraminal stenosis. C6-C7: Disc space loss with circumferential disc osteophyte complex. Mild residual spinal canal blood or fluid. Mild spinal stenosis and spinal cord mass effect. Moderate bilateral C7 foraminal stenosis. C7-T1: Mild anterolisthesis and moderate facet hypertrophy. No spinal stenosis at this level. Mild to moderate bilateral C8 foraminal stenosis. No upper thoracic spinal stenosis. IMPRESSION: 1. 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  2. Evidence of a traumatic disruption of the bilateral C2-C3 facets (probably with small fracture fragments on the right), and suspected superimposed traumatic disruption of the C5-C6 disc space, although no definite fracture at that level. 3. Subsequent dorsal blood or fluid within the spinal canal from C2-C3 to C6-C7, contributing to  spinal stenosis with cord mass effect at those levels. But no definite spinal cord contusion or edema. 4. Bulky prevertebral blood or fluid highly suspicious for anterior ligamentous injury, although no discrete ALL disruption is identified. 5. Recommend spine precautions and Neurosurgery consultation. Electronically Signed: By: Odessa Fleming M.D. On: 06/17/2019 09:07   CT Abdomen Pelvis W Contrast  Result Date: 06/16/2019 CLINICAL DATA:  54 year old male status post fall. EXAM: CT CHEST, ABDOMEN, AND PELVIS WITH CONTRAST TECHNIQUE: Multidetector CT imaging of the chest, abdomen and pelvis was performed following the standard protocol during bolus administration of intravenous contrast. CONTRAST:  OMNIPAQUE IOHEXOL 300 MG/ML  SOLN COMPARISON:  Cervical spine CT today reported separately FINDINGS: CT CHEST FINDINGS Cardiovascular: Mild cardiac pulsation. The thoracic aorta appears intact. Borderline to mild cardiomegaly. No pericardial effusion. Calcified coronary artery atherosclerosis. Mediastinum/Nodes: Negative, no mediastinal hematoma or lymphadenopathy. Lungs/Pleura: Trace retained secretions in the trachea. Otherwise the major airways are patent. No pneumothorax. No pleural effusion. No pulmonary contusion. Dependent opacity most resembles atelectasis. Some areas of superimposed peripheral lung scarring are noted (left lingula series 4, image 80). Musculoskeletal: Normal thoracic segmentation. Thoracic vertebrae appear intact. Chronic right posterolateral 7th 8th and 10th rib fractures. Chronic left lateral 3rd through 10th rib fractures (9th rib chronically fractured in 2  places). No acute rib fracture identified. Sternum intact. CT ABDOMEN PELVIS FINDINGS Hepatobiliary: Nodular and cirrhotic liver. No discrete liver lesion. Negative gallbladder. Trace perihepatic free fluid. Pancreas: Negative. Spleen: Splenomegaly. No discrete splenic lesion. Adrenals/Urinary Tract: Normal adrenal glands. Bilateral renal enhancement and contrast excretion is symmetric and within normal limits. Decompressed proximal ureters. Mildly distended but otherwise unremarkable urinary bladder. Stomach/Bowel: No dilated large or small bowel. Evidence of a normal appendix on coronal image 74. Mostly decompressed stomach. No free air. Only trace perihepatic free fluid in the abdomen. Vascular/Lymphatic: Aortoiliac calcified atherosclerosis. Major arterial structures in the abdomen and pelvis are patent. No lymphadenopathy. There are bulky gastrosplenic varices. Bulky recanalized paraumbilical vein and ventral abdominal wall varices. Diminutive but patent main portal vein. Reproductive: Negative. Other: There is trace pelvic free fluid on series 9, image 108 with simple fluid density. Musculoskeletal: Normal lumbar segmentation. IMPRESSION: 1. No acute traumatic injury identified in the chest, abdomen, or pelvis. 2. Cirrhotic Liver with bulky abdominal varices, splenomegaly, and trace ascites in the abdomen and pelvis. 3. Chronic bilateral rib fractures. 4. Aortic Atherosclerosis (ICD10-I70.0). Calcified coronary artery atherosclerosis. Electronically Signed   By: Odessa Fleming M.D.   On: 06/16/2019 19:02    ROS Blood pressure (!) 122/58, pulse 92, temperature 98.9 F (37.2 C), temperature source Oral, resp. rate 18, height 5\' 7"  (1.702 m), weight 60.6 kg, SpO2 96 %. Estimated body mass index is 20.91 kg/m as calculated from the following:   Height as of this encounter: 5\' 7"  (1.702 m).   Weight as of this encounter: 60.6 kg.  Physical Exam  General: A somnolent but easily arousable 54 year old thin black  male in an Aspen collar in no apparent distress.  HEENT: Normocephalic  Neck: No obvious deformities.  He is in an Biochemist, clinical.  Thorax: Symmetric  Abdomen: Soft  Extremities: Unremarkable, except for arthritic changes  Neurologic exam: The patient is alert and oriented.  He is not completely cooperative.  His motor strength is 5/5 in his bilateral deltoid, bicep, tricep, handgrip, gastrocnemius.  I have reviewed the patient's  CT and cervical MRI performed at [facility].  He has advanced degenerative changes for his age.  He  has a kyphotic cervical spine.  The MRI scan demonstrates some degenerative changes versus mild fractures in the facets.  He has evidence of a small epidural hematoma with moderate spinal stenosis.    Assessment/Plan: Cervical kyphosis, cervical epidural hematoma, cervical stenosis, cervicalgia, cervical facet fracture: I have reviewed the patient's cervical CT and cervical MRI.  I recommend he be placed in an Aspen collar as he will likely heal without intervention.  He is not a great surgical candidate.  I will plan to see him in the office in about 6 weeks for flexion-extension C-spine x-rays to make sure he does not have instability.  Cristi LoronJeffrey D Candace Begue 06/17/2019, 12:04 PM

## 2019-06-17 NOTE — Progress Notes (Signed)
CRITICAL VALUE ALERT  Critical Value: lactic acid 2.1  Date & Time Notied:  06/17/19 1740  Provider Notified: 06/17/19 1745  Orders Received/Actions taken: Provider made aware.

## 2019-06-17 NOTE — Progress Notes (Signed)
Patient's mom informed of transfer to 3 [facility] room 14 was updated on status via Clinical research associate and also Dr.Gonfa talked with her. No further changes noted.

## 2019-06-17 NOTE — Progress Notes (Signed)
Orthostatic vitals held due to orders for bedrest with recent dx of new neck fracture(patient with recent fall prior to hospitalization).

## 2019-06-18 LAB — HEPATIC FUNCTION PANEL
ALT: 40 U/L (ref 0–44)
AST: 97 U/L — ABNORMAL HIGH (ref 15–41)
Albumin: 1.7 g/dL — ABNORMAL LOW (ref 3.5–5.0)
Alkaline Phosphatase: 106 U/L (ref 38–126)
Bilirubin, Direct: 1.7 mg/dL — ABNORMAL HIGH (ref 0.0–0.2)
Indirect Bilirubin: 1.1 mg/dL — ABNORMAL HIGH (ref 0.3–0.9)
Total Bilirubin: 2.8 mg/dL — ABNORMAL HIGH (ref 0.3–1.2)
Total Protein: 6 g/dL — ABNORMAL LOW (ref 6.5–8.1)

## 2019-06-18 LAB — PHOSPHORUS: Phosphorus: 1.7 mg/dL — ABNORMAL LOW (ref 2.5–4.6)

## 2019-06-18 LAB — RENAL FUNCTION PANEL
Albumin: 1.7 g/dL — ABNORMAL LOW (ref 3.5–5.0)
Anion gap: 9 (ref 5–15)
BUN: 10 mg/dL (ref 6–20)
CO2: 21 mmol/L — ABNORMAL LOW (ref 22–32)
Calcium: 7.8 mg/dL — ABNORMAL LOW (ref 8.9–10.3)
Chloride: 97 mmol/L — ABNORMAL LOW (ref 98–111)
Creatinine, Ser: 0.8 mg/dL (ref 0.61–1.24)
GFR calc Af Amer: >60 mL/min (ref >60)
GFR calc non Af Amer: >60 mL/min (ref >60)
Glucose, Bld: 94 mg/dL (ref 70–99)
Phosphorus: 1.6 mg/dL — ABNORMAL LOW (ref 2.5–4.6)
Potassium: 4 mmol/L (ref 3.5–5.1)
Sodium: 127 mmol/L — ABNORMAL LOW (ref 135–145)

## 2019-06-18 LAB — CBC
HCT: 25.1 % — ABNORMAL LOW (ref 39.0–52.0)
Hemoglobin: 7.6 g/dL — ABNORMAL LOW (ref 13.0–17.0)
MCH: 20.7 pg — ABNORMAL LOW (ref 26.0–34.0)
MCHC: 30.3 g/dL (ref 30.0–36.0)
MCV: 68.2 fL — ABNORMAL LOW (ref 80.0–100.0)
Platelets: 56 10*3/uL — ABNORMAL LOW (ref 150–400)
RBC: 3.68 MIL/uL — ABNORMAL LOW (ref 4.22–5.81)
RDW: 24.1 % — ABNORMAL HIGH (ref 11.5–15.5)
WBC: 17.1 10*3/uL — ABNORMAL HIGH (ref 4.0–10.5)
nRBC: 1.8 % — ABNORMAL HIGH (ref 0.0–0.2)

## 2019-06-18 LAB — HEPATITIS PANEL, ACUTE
HCV Ab: NONREACTIVE
Hep A IgM: NONREACTIVE
Hep B C IgM: NONREACTIVE
Hepatitis B Surface Ag: NONREACTIVE

## 2019-06-18 LAB — PROTIME-INR
INR: 1.4 — ABNORMAL HIGH (ref 0.8–1.2)
Prothrombin Time: 16.7 seconds — ABNORMAL HIGH (ref 11.4–15.2)

## 2019-06-18 LAB — CK: Total CK: 38 U/L — ABNORMAL LOW (ref 49–397)

## 2019-06-18 LAB — LACTATE DEHYDROGENASE: LDH: 261 U/L — ABNORMAL HIGH (ref 98–192)

## 2019-06-18 LAB — MAGNESIUM: Magnesium: 1.9 mg/dL (ref 1.7–2.4)

## 2019-06-18 MED ORDER — MORPHINE SULFATE (PF) 2 MG/ML IV SOLN
2.0000 mg | Freq: Once | INTRAVENOUS | Status: AC
  Administered 2019-06-18: 2 mg via INTRAVENOUS
  Filled 2019-06-18: qty 1

## 2019-06-18 MED ORDER — SODIUM CHLORIDE 0.9 % IV SOLN
510.0000 mg | Freq: Once | INTRAVENOUS | Status: AC
  Administered 2019-06-18: 510 mg via INTRAVENOUS
  Filled 2019-06-18: qty 17

## 2019-06-18 MED ORDER — SODIUM PHOSPHATES 45 MMOLE/15ML IV SOLN
30.0000 mmol | Freq: Once | INTRAVENOUS | Status: AC
  Administered 2019-06-18: 30 mmol via INTRAVENOUS
  Filled 2019-06-18: qty 10

## 2019-06-18 MED ORDER — OXYCODONE HCL 5 MG PO TABS
5.0000 mg | ORAL_TABLET | Freq: Four times a day (QID) | ORAL | Status: DC | PRN
  Administered 2019-06-18 – 2019-06-20 (×5): 5 mg via ORAL
  Filled 2019-06-18 (×5): qty 1

## 2019-06-18 MED ORDER — FENTANYL CITRATE (PF) 100 MCG/2ML IJ SOLN: 50.0000 ug | INTRAMUSCULAR | Status: DC | PRN

## 2019-06-18 MED ORDER — CYCLOBENZAPRINE HCL 10 MG PO TABS
10.0000 mg | ORAL_TABLET | Freq: Three times a day (TID) | ORAL | Status: DC | PRN
  Administered 2019-06-18 – 2019-06-20 (×4): 10 mg via ORAL
  Filled 2019-06-18 (×4): qty 1

## 2019-06-18 NOTE — Evaluation (Signed)
Physical Therapy Evaluation Patient Details Name: Theodore Brown MRN: [id number] DOB: December 12, 1965 Today's Date: 06/18/2019   History of Present Illness  54 yo admitted with neck pain and paraesthesias after 2 recent falls. Pt with metabolic encephalopathy and cervical facet fx. PMHx: ETOH abuse, arthritis, cirrhosis  Clinical Impression  Pt supine on arrival with chin tucked well inside craddle of collar and pt educated for collar wear and positioning. Pt educated for cervical precautions, transfers and mobility. PT with decreased strength, coordination and fine motor control of bil UE limited by pain. Pt also with decreased balance with history of falls and decreased gait tolerance who will benefit from acute therapy to maximize independence and safety to decrease falls. Pt does not drive and either gets assist or public transportation and reports he would be able to travel to [place].      Follow Up Recommendations Outpatient PT    Equipment Recommendations  None recommended by PT    Recommendations for Other Services       Precautions / Restrictions Precautions Precautions: Cervical;Fall Required Braces or Orthoses: Cervical Brace Cervical Brace: Hard collar;At all times      Mobility  Bed Mobility Overal bed mobility: Needs Assistance Bed Mobility: Supine to Sit;Sit to Supine     Supine to sit: Supervision Sit to supine: Supervision   General bed mobility comments: supervision with cues for technique with cervical precautions  Transfers Overall transfer level: Needs assistance Equipment used: None Transfers: Sit to/from Stand Sit to Stand: Supervision         General transfer comment: supervision for safety  Ambulation/Gait Ambulation/Gait assistance: Supervision Gait Distance (Feet): 400 Feet Assistive device: None Gait Pattern/deviations: Step-through pattern;Decreased stride length   Gait velocity interpretation: >2.62 ft/sec, indicative of community  ambulatory General Gait Details: pt able to perform gait without noted LOB but unable to perform significant balance challenges due to collar and bil UE weakness/pain  Stairs            Wheelchair Mobility    Modified Rankin (Stroke Patients Only)       Balance Overall balance assessment: History of Falls                                           Pertinent Vitals/Pain Pain Assessment: 0-10 Pain Score: 7  Pain Location: bil arms and neck Pain Descriptors / Indicators: Constant;Throbbing Pain Intervention(s): Limited activity within patient's tolerance;Monitored during session;Repositioned    Home Living Family/patient expects to be discharged to:: Private residence Living Arrangements: Alone Available Help at Discharge: Family;Friend(s);Available PRN/intermittently Type of Home: Apartment Home Access: Level entry     Home Layout: One level Home Equipment: None      Prior Function Level of Independence: Independent         Comments: doesn't drive or work currently     Hand Dominance   Dominant Hand: Right    Extremity/Trunk Assessment   Upper Extremity Assessment Upper Extremity Assessment: Defer to OT evaluation(pt with noted decreased shoulder flexion, grip strength and decreased fine motor and coordination)    Lower Extremity Assessment Lower Extremity Assessment: Overall WFL for tasks assessed    Cervical / Trunk Assessment Cervical / Trunk Assessment: Other exceptions Cervical / Trunk Exceptions: cervical collar  Communication      Cognition Arousal/Alertness: Awake/alert Behavior During Therapy: WFL for tasks assessed/performed Overall Cognitive Status: Within Functional Limits for  tasks assessed                                 General Comments: pt needing cues to maintain chin on craddle of collar      General Comments      Exercises     Assessment/Plan    PT Assessment Patient needs continued PT  services  PT Problem List Decreased mobility;Decreased activity tolerance;Decreased balance;Pain       PT Treatment Interventions Gait training;Balance training;Functional mobility training;Therapeutic activities;Patient/family education    PT Goals (Current goals can be found in the Care Plan section)  Acute Rehab PT Goals Patient Stated Goal: return home and get medicaid started PT Goal Formulation: With patient Time For Goal Achievement: 07/02/19 Potential to Achieve Goals: Good    Frequency Min 3X/week   Barriers to discharge Decreased caregiver support      Co-evaluation               AM-PAC PT "6 Clicks" Mobility  Outcome Measure Help needed turning from your back to your side while in a flat bed without using bedrails?: A Little Help needed moving from lying on your back to sitting on the side of a flat bed without using bedrails?: A Little Help needed moving to and from a bed to a chair (including a wheelchair)?: A Little Help needed standing up from a chair using your arms (e.g., wheelchair or bedside chair)?: A Little Help needed to walk in hospital room?: A Little Help needed climbing 3-5 steps with a railing? : A Little 6 Click Score: 18    End of Session Equipment Utilized During Treatment: Gait belt;Cervical collar Activity Tolerance: Patient tolerated treatment well Patient left: in bed;with call bell/phone within reach;with bed alarm set Nurse Communication: Mobility status;Precautions PT Visit Diagnosis: Other abnormalities of gait and mobility (R26.89)    Time: 6720-9470 PT Time Calculation (min) (ACUTE ONLY): 21 min   Charges:   PT Evaluation $PT Eval Moderate Complexity: 1 Mod          Ho Parisi P, PT Acute Rehabilitation Services Pager: [phone] Office: [phone]   Enedina Finner Channell Quattrone 06/18/2019, 12:58 PM

## 2019-06-18 NOTE — Progress Notes (Signed)
Providing Compassionate, Quality Care - Together   Subjective: Patient reports significant cervical spine pain with radiation into bilateral shoulders. Denies numbness, tingling, or weakness in BUE orBLE.  Objective: Vital signs in last 24 hours: Temp:  [98 F (36.7 C)-102.4 F (39.1 C)] 100.3 F (37.9 C) (02/13 0829) Pulse Rate:  [92-187] 187 (02/13 0829) Resp:  [16-20] 20 (02/13 0829) BP: (116-152)/(57-75) 152/75 (02/13 0829) SpO2:  [97 %-100 %] 100 % (02/13 0829)  Intake/Output from previous day: 02/12 0701 - 02/13 0700 In: 2791.3 [P.O.:1800; I.V.:600; IV Piggyback:391.3] Out: 1800 [Urine:1800] Intake/Output this shift: No intake/output data recorded.  Alert and oriented x 4 PERRLA CN II-XII grossly intact MAE, Strength and sensation intact     Lab Results: Recent Labs    06/17/19 0719 06/17/19 0719 06/17/19 1501 06/18/19 0739  WBC 16.4*  --   --  17.1*  HGB 7.7*   < > 7.5* 7.6*  HCT 24.5*   < > 23.9* 25.1*  PLT 50*  --   --  56*   < > = values in this interval not displayed.   BMET Recent Labs    06/17/19 0719 06/18/19 0739  NA 128* 127*  K 3.4* 4.0  CL 97* 97*  CO2 23 21*  GLUCOSE 89 94  BUN 20 10  CREATININE 0.99 0.80  CALCIUM 7.7* 7.8*    Studies/Results: CT Head Wo Contrast  Result Date: 06/16/2019 CLINICAL DATA:  Fall. EXAM: CT HEAD WITHOUT CONTRAST CT CERVICAL SPINE WITHOUT CONTRAST TECHNIQUE: Multidetector CT imaging of the head and cervical spine was performed following the standard protocol without intravenous contrast. Multiplanar CT image reconstructions of the cervical spine were also generated. COMPARISON:  03/03/2018 FINDINGS: CT HEAD FINDINGS Brain: No acute intracranial abnormality. Specifically, no hemorrhage, hydrocephalus, mass lesion, acute infarction, or significant intracranial injury. Vascular: No hyperdense vessel or unexpected calcification. Skull: No acute calvarial abnormality. Sinuses/Orbits: Visualized paranasal  sinuses and mastoids clear. Orbital soft tissues unremarkable. Other: None CT CERVICAL SPINE FINDINGS Alignment: Anterolisthesis of C2 on C3 and C3 on C4 related to facet disease. Skull base and vertebrae: No acute fracture. No primary bone lesion or focal pathologic process. Soft tissues and spinal canal: No prevertebral fluid or swelling. No visible canal hematoma. Disc levels: Advanced degenerative disc disease from C3-4 through C6-7. Advanced diffuse bilateral degenerative facet disease. Upper chest: No acute findings Other: None IMPRESSION: No acute intracranial abnormality. Advanced degenerative disc and facet disease. No acute bony abnormality. Electronically Signed   By: Charlett Nose M.D.   On: 06/16/2019 17:54   CT Chest W Contrast  Result Date: 06/16/2019 CLINICAL DATA:  54 year old male status post fall. EXAM: CT CHEST, ABDOMEN, AND PELVIS WITH CONTRAST TECHNIQUE: Multidetector CT imaging of the chest, abdomen and pelvis was performed following the standard protocol during bolus administration of intravenous contrast. CONTRAST:  OMNIPAQUE IOHEXOL 300 MG/ML  SOLN COMPARISON:  Cervical spine CT today reported separately FINDINGS: CT CHEST FINDINGS Cardiovascular: Mild cardiac pulsation. The thoracic aorta appears intact. Borderline to mild cardiomegaly. No pericardial effusion. Calcified coronary artery atherosclerosis. Mediastinum/Nodes: Negative, no mediastinal hematoma or lymphadenopathy. Lungs/Pleura: Trace retained secretions in the trachea. Otherwise the major airways are patent. No pneumothorax. No pleural effusion. No pulmonary contusion. Dependent opacity most resembles atelectasis. Some areas of superimposed peripheral lung scarring are noted (left lingula series 4, image 80). Musculoskeletal: Normal thoracic segmentation. Thoracic vertebrae appear intact. Chronic right posterolateral 7th 8th and 10th rib fractures. Chronic left lateral 3rd through 10th rib fractures (  9th rib chronically  fractured in 2 places). No acute rib fracture identified. Sternum intact. CT ABDOMEN PELVIS FINDINGS Hepatobiliary: Nodular and cirrhotic liver. No discrete liver lesion. Negative gallbladder. Trace perihepatic free fluid. Pancreas: Negative. Spleen: Splenomegaly. No discrete splenic lesion. Adrenals/Urinary Tract: Normal adrenal glands. Bilateral renal enhancement and contrast excretion is symmetric and within normal limits. Decompressed proximal ureters. Mildly distended but otherwise unremarkable urinary bladder. Stomach/Bowel: No dilated large or small bowel. Evidence of a normal appendix on coronal image 74. Mostly decompressed stomach. No free air. Only trace perihepatic free fluid in the abdomen. Vascular/Lymphatic: Aortoiliac calcified atherosclerosis. Major arterial structures in the abdomen and pelvis are patent. No lymphadenopathy. There are bulky gastrosplenic varices. Bulky recanalized paraumbilical vein and ventral abdominal wall varices. Diminutive but patent main portal vein. Reproductive: Negative. Other: There is trace pelvic free fluid on series 9, image 108 with simple fluid density. Musculoskeletal: Normal lumbar segmentation. IMPRESSION: 1. No acute traumatic injury identified in the chest, abdomen, or pelvis. 2. Cirrhotic Liver with bulky abdominal varices, splenomegaly, and trace ascites in the abdomen and pelvis. 3. Chronic bilateral rib fractures. 4. Aortic Atherosclerosis (ICD10-I70.0). Calcified coronary artery atherosclerosis. Electronically Signed   By: Odessa Fleming M.D.   On: 06/16/2019 19:02   CT Cervical Spine Wo Contrast  Result Date: 06/16/2019 CLINICAL DATA:  Fall. EXAM: CT HEAD WITHOUT CONTRAST CT CERVICAL SPINE WITHOUT CONTRAST TECHNIQUE: Multidetector CT imaging of the head and cervical spine was performed following the standard protocol without intravenous contrast. Multiplanar CT image reconstructions of the cervical spine were also generated. COMPARISON:  03/03/2018 FINDINGS:  CT HEAD FINDINGS Brain: No acute intracranial abnormality. Specifically, no hemorrhage, hydrocephalus, mass lesion, acute infarction, or significant intracranial injury. Vascular: No hyperdense vessel or unexpected calcification. Skull: No acute calvarial abnormality. Sinuses/Orbits: Visualized paranasal sinuses and mastoids clear. Orbital soft tissues unremarkable. Other: None CT CERVICAL SPINE FINDINGS Alignment: Anterolisthesis of C2 on C3 and C3 on C4 related to facet disease. Skull base and vertebrae: No acute fracture. No primary bone lesion or focal pathologic process. Soft tissues and spinal canal: No prevertebral fluid or swelling. No visible canal hematoma. Disc levels: Advanced degenerative disc disease from C3-4 through C6-7. Advanced diffuse bilateral degenerative facet disease. Upper chest: No acute findings Other: None IMPRESSION: No acute intracranial abnormality. Advanced degenerative disc and facet disease. No acute bony abnormality. Electronically Signed   By: Charlett Nose M.D.   On: 06/16/2019 17:54   MR CERVICAL SPINE WO CONTRAST  Addendum Date: 06/17/2019   ADDENDUM REPORT: 06/17/2019 09:33 ADDENDUM: Study discussed by telephone with Dr. Alanda Slim on 06/17/2019 at 0931 hours. He is contacting Neurosurgery. Electronically Signed   By: Odessa Fleming M.D.   On: 06/17/2019 09:33   Result Date: 06/17/2019 CLINICAL DATA:  54 year old male with multiple recent falls. Severe neck pain. EXAM: MRI CERVICAL SPINE WITHOUT CONTRAST TECHNIQUE: Multiplanar, multisequence MR imaging of the cervical spine was performed. No intravenous contrast was administered. COMPARISON:  Cervical spine CT yesterday. FINDINGS: Alignment: Reversal of cervical lordosis and trace anterolisthesis of C2 on C3 is stable since yesterday. Vertebrae: There is marrow edema in the chronically degenerated right C2-C3 facet (series 7, image 3), and possible small acute fracture fragments from that facet are identified on series 10, image 23  yesterday. Associated abnormal fluid within that facet joint, and also the contralateral left C2-C3 facet. See series 8, image 13. No marrow edema identified elsewhere, but there is also conspicuous increased fluid signal in the C5-C6 disc space (series 7,  image 11. Cord: Abnormal dorsal intraspinal blood or fluid tracking from C2-C3 to C6-C7 and contributes to spinal stenosis at those levels (series 5, image 8, series 8, image 23, series 10, image 14). The fluid appears to abate at the cervicothoracic junction. There is superimposed cervical spinal cord mass effect but no definite cord edema or intra-axial blood products. Posterior Fossa, vertebral arteries, paraspinal tissues: Cervicomedullary junction is within normal limits. Negative visible posterior fossa. Preserved major vascular flow voids in the neck. Bulky abnormal prevertebral fluid or edema along the entire ventral cervical spine, up to 13 mm in thickness (series 5, image 8 and series 8, image 17). No definite discontinuity of the anterior longitudinal ligament is identified. This also abates at the cervicothoracic junction. There is muscular edema around the right C2-C3 facet some, but no definite interspinous ligament injury. No left-side muscle edema identified. Disc levels: C2-C3: Mild anterolisthesis with severe right facet degeneration. Both facets are abnormal as stated earlier. Disc bulging is superimposed on dorsal spinal canal blood or fluid. Mild spinal stenosis and spinal cord mass effect. Severe right C3 foraminal stenosis. C3-C4: Mild anterolisthesis with disc, endplate, and posterior element degeneration. Dorsal canal blood or fluid. Mild spinal stenosis and spinal cord mass effect. Moderate to severe bilateral C4 foraminal stenosis. C4-C5: Severe disc space loss with circumferential disc osteophyte complex. Dorsal bladder fluid. Spinal stenosis with mild spinal cord mass effect. Mild to moderate bilateral C5 foraminal stenosis. C5-C6:  Abnormal signal within the disc space, underlying severe disc space loss with bulky anterior endplate osteophytes. Decreasing dorsal blood or fluid at this level. Mild spinal stenosis and spinal cord mass effect. Mild to moderate bilateral C6 foraminal stenosis. C6-C7: Disc space loss with circumferential disc osteophyte complex. Mild residual spinal canal blood or fluid. Mild spinal stenosis and spinal cord mass effect. Moderate bilateral C7 foraminal stenosis. C7-T1: Mild anterolisthesis and moderate facet hypertrophy. No spinal stenosis at this level. Mild to moderate bilateral C8 foraminal stenosis. No upper thoracic spinal stenosis. IMPRESSION: 1. 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 2. Evidence of a traumatic disruption of the bilateral C2-C3 facets (probably with small fracture fragments on the right), and suspected superimposed traumatic disruption of the C5-C6 disc space, although no definite fracture at that level. 3. Subsequent dorsal blood or fluid within the spinal canal from C2-C3 to C6-C7, contributing to spinal stenosis with cord mass effect at those levels. But no definite spinal cord contusion or edema. 4. Bulky prevertebral blood or fluid highly suspicious for anterior ligamentous injury, although no discrete ALL disruption is identified. 5. Recommend spine precautions and Neurosurgery consultation. Electronically Signed: By: Odessa Fleming M.D. On: 06/17/2019 09:07   CT Abdomen Pelvis W Contrast  Result Date: 06/16/2019 CLINICAL DATA:  54 year old male status post fall. EXAM: CT CHEST, ABDOMEN, AND PELVIS WITH CONTRAST TECHNIQUE: Multidetector CT imaging of the chest, abdomen and pelvis was performed following the standard protocol during bolus administration of intravenous contrast. CONTRAST:  OMNIPAQUE IOHEXOL 300 MG/ML  SOLN COMPARISON:  Cervical spine CT today reported separately FINDINGS: CT CHEST FINDINGS Cardiovascular: Mild cardiac pulsation.  The thoracic aorta appears intact. Borderline to mild cardiomegaly. No pericardial effusion. Calcified coronary artery atherosclerosis. Mediastinum/Nodes: Negative, no mediastinal hematoma or lymphadenopathy. Lungs/Pleura: Trace retained secretions in the trachea. Otherwise the major airways are patent. No pneumothorax. No pleural effusion. No pulmonary contusion. Dependent opacity most resembles atelectasis. Some areas of superimposed peripheral lung scarring are noted (left lingula series 4, image 80). Musculoskeletal:  Normal thoracic segmentation. Thoracic vertebrae appear intact. Chronic right posterolateral 7th 8th and 10th rib fractures. Chronic left lateral 3rd through 10th rib fractures (9th rib chronically fractured in 2 places). No acute rib fracture identified. Sternum intact. CT ABDOMEN PELVIS FINDINGS Hepatobiliary: Nodular and cirrhotic liver. No discrete liver lesion. Negative gallbladder. Trace perihepatic free fluid. Pancreas: Negative. Spleen: Splenomegaly. No discrete splenic lesion. Adrenals/Urinary Tract: Normal adrenal glands. Bilateral renal enhancement and contrast excretion is symmetric and within normal limits. Decompressed proximal ureters. Mildly distended but otherwise unremarkable urinary bladder. Stomach/Bowel: No dilated large or small bowel. Evidence of a normal appendix on coronal image 74. Mostly decompressed stomach. No free air. Only trace perihepatic free fluid in the abdomen. Vascular/Lymphatic: Aortoiliac calcified atherosclerosis. Major arterial structures in the abdomen and pelvis are patent. No lymphadenopathy. There are bulky gastrosplenic varices. Bulky recanalized paraumbilical vein and ventral abdominal wall varices. Diminutive but patent main portal vein. Reproductive: Negative. Other: There is trace pelvic free fluid on series 9, image 108 with simple fluid density. Musculoskeletal: Normal lumbar segmentation. IMPRESSION: 1. No acute traumatic injury identified in the  chest, abdomen, or pelvis. 2. Cirrhotic Liver with bulky abdominal varices, splenomegaly, and trace ascites in the abdomen and pelvis. 3. Chronic bilateral rib fractures. 4. Aortic Atherosclerosis (ICD10-I70.0). Calcified coronary artery atherosclerosis. Electronically Signed   By: Odessa Fleming M.D.   On: 06/16/2019 19:02   ECHOCARDIOGRAM COMPLETE  Result Date: 06/17/2019    ECHOCARDIOGRAM REPORT   Patient Name:   Theodore Brown Date of Exam: 06/17/2019 Medical Rec #:  [id number]           Height:       67.0 in Accession #:    [id number]          Weight:       133.5 lb Date of Birth:  08/07/1965            BSA:          1.70 m Patient Age:    53 years            BP:           122/58 mmHg Patient Gender: M                   HR:           96 bpm. Exam Location:  Inpatient Procedure: 2D Echo, Color Doppler and Cardiac Doppler Indications:    Bacteremia R78.81  History:        Patient has no prior history of Echocardiogram examinations.                 Signs/Symptoms:Bacteremia.  Sonographer:    Irving Burton Senior RDCS Referring Phys: [id number] Boyce Medici GONFA IMPRESSIONS  1. No obvious SBE if suspision high can consider TEE.  2. Left ventricular ejection fraction, by estimation, is 60 to 65%. The left ventricle has normal function. The left ventricle has no regional wall motion abnormalities. Left ventricular diastolic parameters were normal.  3. Right ventricular systolic function is normal. The right ventricular size is normal. There is moderately elevated pulmonary artery systolic pressure.  4. No subcostal imaging due to neck brace.  5. The mitral valve is normal in structure and function. Mild mitral valve regurgitation. No evidence of mitral stenosis.  6. The aortic valve is tricuspid. Aortic valve regurgitation is not visualized. No aortic stenosis is present. FINDINGS  Left Ventricle: Left ventricular ejection fraction, by estimation, is 60 to 65%. The left  ventricle has normal function. The left ventricle has no  regional wall motion abnormalities. The left ventricular internal cavity size was normal in size. There is  no left ventricular hypertrophy. Left ventricular diastolic parameters were normal. Right Ventricle: The right ventricular size is normal. No increase in right ventricular wall thickness. Right ventricular systolic function is normal. There is moderately elevated pulmonary artery systolic pressure. The tricuspid regurgitant velocity is 2.63 m/s, and with an assumed right atrial pressure of 15 mmHg, the estimated right ventricular systolic pressure is 42.7 mmHg. Left Atrium: Left atrial size was normal in size. Right Atrium: Right atrial size was normal in size. Pericardium: There is no evidence of pericardial effusion. Mitral Valve: The mitral valve is normal in structure and function. There is moderate thickening of the mitral valve leaflet(s). Normal mobility of the mitral valve leaflets. Mild mitral valve regurgitation. No evidence of mitral valve stenosis. There is  no evidence of mitral valve vegetation. Tricuspid Valve: The tricuspid valve is normal in structure. Tricuspid valve regurgitation is mild . No evidence of tricuspid stenosis. There is no evidence of tricuspid valve vegetation. Aortic Valve: The aortic valve is tricuspid. Aortic valve regurgitation is not visualized. No aortic stenosis is present. Aortic valve mean gradient measures 9.0 mmHg. Aortic valve peak gradient measures 18.0 mmHg. Aortic valve area, by VTI measures 3.11  cm. There is no evidence of aortic valve vegetation. Pulmonic Valve: The pulmonic valve was normal in structure. Pulmonic valve regurgitation is trivial. No evidence of pulmonic stenosis. There is no evidence of pulmonic valve vegetation. Aorta: The aortic root is normal in size and structure. IAS/Shunts: The interatrial septum was not well visualized.  LEFT VENTRICLE PLAX 2D LVIDd:         5.00 cm   Diastology LVIDs:         3.10 cm   LV e' lateral:   10.40 cm/s LV  PW:         1.10 cm   LV E/e' lateral: 7.6 LV IVS:        0.80 cm   LV e' medial:    13.20 cm/s LVOT diam:     2.10 cm   LV E/e' medial:  6.0 LV SV:         104.95 ml LV SV Index:   47.54 LVOT Area:     3.46 cm  RIGHT VENTRICLE RV S prime:     15.30 cm/s TAPSE (M-mode): 2.7 cm LEFT ATRIUM             Index       RIGHT ATRIUM           Index LA diam:        3.50 cm 2.06 cm/m  RA Area:     15.30 cm LA Vol (A2C):   73.1 ml 42.92 ml/m RA Volume:   38.30 ml  22.49 ml/m LA Vol (A4C):   57.2 ml 33.59 ml/m LA Biplane Vol: 71.1 ml 41.75 ml/m  AORTIC VALVE AV Area (Vmax):    3.35 cm AV Area (Vmean):   3.19 cm AV Area (VTI):     3.11 cm AV Vmax:           212.00 cm/s AV Vmean:          140.000 cm/s AV VTI:            0.337 m AV Peak Grad:      18.0 mmHg AV Mean Grad:      9.0  mmHg LVOT Vmax:         205.00 cm/s LVOT Vmean:        129.000 cm/s LVOT VTI:          0.303 m LVOT/AV VTI ratio: 0.90  AORTA Ao Root diam: 3.10 cm Ao Asc diam:  2.90 cm MITRAL VALVE               TRICUSPID VALVE MV Area (PHT): 3.65 cm    TR Peak grad:   27.7 mmHg MV Decel Time: 208 msec    TR Vmax:        263.00 cm/s MV E velocity: 79.20 cm/s MV A velocity: 65.60 cm/s  SHUNTS MV E/A ratio:  1.21        Systemic VTI:  0.30 m                            Systemic Diam: 2.10 cm Jenkins Rouge MD Electronically signed by Jenkins Rouge MD Signature Date/Time: 06/17/2019/2:11:51 PM    Final     Assessment/Plan: Patient with multiple medical problems was admitted following multiple falls, with complaints of neck pain. Patient's CT demonstrated a kyphotic cervical spine, mild facet fractures, and an epidural hematoma with spinal stenosis. No pain medication aside from Tylenol and IV fentanyl ordered.   LOS: 2 days    -Started patient on 5 mg oxycodone PRN for pain and Flexeril 10 mg for muscle spasms. -Will plan to see him in the office in about 6 weeks for flexion-extension C-spine x-rays to make sure he does not have instability.  Neurosurgery  will sign off at this time. Please reconsult if we can be of further assistance.   Viona Gilmore, DNP, AGNP-C Nurse Practitioner  [facility] [place] [address], [address], [place], [place] [address] P: [phone]    F: [phone]  06/18/2019, 10:29 AM

## 2019-06-18 NOTE — Progress Notes (Signed)
PROGRESS NOTE  Theodore Brown MBT:[id number] DOB: 09-Jul-1965   PCP: Loletta Specter, PA-C  Patient is from: Home.  DOA: 06/16/2019 LOS: 2  Brief Narrative / Interim history: 54 year old male with history of alcohol abuse, liver cirrhosis, osteoarthritis and prior neck surgery presenting with neck pain and bilateral upper extremity paresthesia after fall 2 days ago.  Reportedly fell backward and hit his head without loss of consciousness.  Tred to manage his neck pain at home before he presented to ED.  In ED, hypotensive to 86/58 and tachycardic to 122.  But improved with IV fluid.  Afebrile.  Normal saturation on room air.  Hgb 7 (baseline 10.2).  MCV 66.  WBC 23.2. Na 124.  K3.4.  Cr 1.34 (b/l 0.92).  EKG LVH with bilateral atrial enlargement.  CTA head and neck without acute finding but degenerative disc and facet disease.  CTA chest/abdomen/pelvis revealed cirrhotic liver with bulky abdominal varices, splenomegaly, trace ascites and chronic bilateral rib fractures.  MRI cervical spine ordered.  Patient was admitted for SIRS, acute encephalopathy, volume depletion, cervical radiculopathy and electrolyte derangement.  The next morning, blood cultures grew GPC in chains.  ID consulted.  Started on IV ceftriaxone and vancomycin.  Repeat blood culture ordered to be drawn at 24 hours after antibiotic initiation.  Echocardiogram with normal EF and no vegetation.  MRI cervical spine revealed acute cervical spine injury with evidence of bilateral C2-C3 facet disruption, C5-C6 disc space disruption, blood or fluid within the spinal canal from C2-C3 to C6-C7 contributing to spinal stenosis with cord mass-effect but no definite spinal cord contusion or edema and bulky prevertebral blood or fluid suspicious for anterior ligamentous injury.  Neurosurgery consulted and recommended Aspen collar, conservative management and outpatient follow-up in 6 weeks for flexion-extension C-spine x-rays.      Subjective: Complaining neck and bilateral shoulder pain down to his lower back.  He says his pain is severe and constant.  He denies numbness, tingling, focal weakness, bowel or bladder issues.  Denies chest pain or dyspnea.   Objective: Vitals:   06/17/19 1930 06/17/19 2317 06/18/19 0304 06/18/19 0829  BP: 121/67 (!) 131/58 124/68 (!) 152/75  Pulse: 92 (!) 106 95 (!) 187  Resp: 16 20 16 20   Temp: 98 F (36.7 C) 100 F (37.8 C) 99.3 F (37.4 C) 100.3 F (37.9 C)  TempSrc:  Oral Oral Oral  SpO2: 100% 98% 97% 100%  Weight:      Height:        Intake/Output Summary (Last 24 hours) at 06/18/2019 1042 Last data filed at 06/18/2019 0500 Gross per 24 hour  Intake 2311.3 ml  Output 1200 ml  Net 1111.3 ml   Filed Weights   06/16/19 1228 06/17/19 0030  Weight: 63.5 kg 60.6 kg    Examination:  GENERAL: Appears to be in distress from pain. HEENT: MMM.  Vision and hearing grossly intact.  NECK: Aspen collar in place. RESP:  No IWOB. Good air movement bilaterally. CVS:  RRR. Heart sounds normal.  ABD/GI/GU: Bowel sounds present. Soft. Non tender.  MSK/EXT:  Moves extremities. No apparent deformity. No edema.  SKIN: no apparent skin lesion or wound NEURO: Awake, alert and oriented appropriately.  CN grossly intact.  Motor 4/5 in BUEs and 5/5 in BLEs.  Patellar reflex symmetric.  Light sensation intact. PSYCH: Appears to be in distress from pain.  Appropriate affect.  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 -Pain control-Tylenol, Flexeril, oxycodone and fentanyl -SCD for VTE prophylaxis in the setting of bleeding and thrombocytopenia -PT/OT eval  Severe sepsis due to Streptococcus bacteremia: Sepsis physiology improving.  Likely odontogenic source.   Denies IVDU.  TTE without significant finding or vegetation. -Appreciate guidance by ID -Continue ceftriaxone and vancomycin 2/12> -Follow cultures speciation and sensitivity -Cannot do TEE due to cervical fracture.  Alcoholic liver cirrhosis/varices/splenomegaly Alcohol use disorder-admits drinking 3-4 beers a day. Elevated liver enzyme/hyperbilirubinemia-likely due to alcohol.  CK, acute hepatitis panel and HIV negative. Coagulopathy: INR 1.4.  Likely due to liver cirrhosis. -Encourage cessation -CIWA with as needed Ativan -Overall multivitamins and thiamine after banana bag  Acute metabolic encephalopathy: Likely due to the above.  Resolved. -Treat treatable causes  Acute kidney injury: Likely due to sepsis and dehydration.  Resolved.  Hyponatremia/hypophosphatemia: Likely due to alcohol abuse. -Replenish and recheck  Hypokalemia/hypomagnesemia-resolved.  Iron deficiency anemia: Baseline Hgb 8-9> 7.0 (admit) > 7.6.  Likely due to alcohol. -IV Feraheme -Check LDH and haptoglobin. -Continue monitoring.  Leukocytosis/bandemia: Likely due to bacteremia.  Improving. -Continue monitoring   Thrombocytopenia: Likely due to alcohol.  About baseline. -Continue monitoring.               DVT prophylaxis: SCD due to epidural bleed and thrombocytopenia Code Status: Full code Family Communication: Patient and/or RN.  Updated patient's mother 2/12.  Discharge barrier: Sepsis/bacteremia/electrolyte derangement Patient is from: Home Final disposition: To be determined after PT/OT eval  Consultants: Neurosurgery   Microbiology summarized: [id number] negative. Influenza PCR negative. Blood cultures GPC in chains in 4 out of 4 bottles.  Sch Meds:  Scheduled Meds: . ferrous sulfate  325 mg Oral BID WC  . [START ON 9/52/8413] folic acid  5 mg Oral Daily  . [START ON 06/20/2019] multivitamin with minerals  1 tablet Oral Daily  . [START ON 06/20/2019] thiamine  100 mg Oral  Daily   Or  . [START ON 06/20/2019] thiamine  100 mg Intravenous Daily   Continuous Infusions: . cefTRIAXone (ROCEPHIN)  IV 2 g (06/18/19 1026)  . ferumoxytol    . banana bag IV 1000 mL 100 mL/hr at 06/18/19 2440  . sodium phosphate  Dextrose 5% IVPB    . vancomycin 750 mg (06/18/19 0605)   PRN Meds:.acetaminophen, cyclobenzaprine, fentaNYL (SUBLIMAZE) injection, iohexol, LORazepam **OR** LORazepam, oxyCODONE  Antimicrobials: Anti-infectives (From admission, onward)   Start     Dose/Rate Route Frequency Ordered Stop   06/18/19 0600  vancomycin (VANCOREADY) IVPB 750 mg/150 mL     750 mg 150 mL/hr over 60 Minutes Intravenous Every 12 hours 06/17/19 1615     06/17/19 1630  vancomycin (VANCOREADY) IVPB 1250 mg/250 mL     1,250 mg 166.7 mL/hr over 90 Minutes Intravenous  Once 06/17/19 1615 06/17/19 1837   06/17/19 0800  cefTRIAXone (ROCEPHIN) 2 g in sodium chloride 0.9 % 100 mL IVPB     2 g 200 mL/hr over 30 Minutes Intravenous Every 24 hours 06/17/19 0639         I have personally reviewed the following labs and images: CBC: Recent Labs  Lab 06/16/19 1234 06/16/19 2350 06/17/19 0719 06/17/19 1501 06/18/19 0739  WBC 23.2* 17.7* 16.4*  --  17.1*  HGB 7.0* 7.9* 7.7* 7.5* 7.6*  HCT 23.6* 25.5* 24.5* 23.9* 25.1*  MCV 65.7* 67.6* 66.6*  --  68.2*  PLT 52* 51* 50*  --  56*   BMP &GFR Recent Labs  Lab 06/16/19 1234 06/16/19 2350 06/17/19 0719 06/17/19 1046 06/18/19 0739  NA 124* 125* 128*  --  127*  K 3.4* 3.3* 3.4*  --  4.0  CL 91* 93* 97*  --  97*  CO2 22 22 23   --  21*  GLUCOSE 96 82 89  --  94  BUN 22* 22* 20  --  10  CREATININE 1.34* 1.18 0.99  --  0.80  CALCIUM 8.1* 7.9* 7.7*  --  7.8*  MG  --  2.2  --  2.0 1.9  PHOS  --  1.7*  --  <1.0* 1.7*  1.6*   Estimated Creatinine Clearance: 92.6 mL/min (by C-G formula based on SCr of 0.8 mg/dL). Liver & Pancreas: Recent Labs  Lab 06/16/19 1234 06/16/19 2350 06/17/19 0719 06/18/19 0739  AST 104* 96* 88* 97*   ALT 45* 40 41 40  ALKPHOS 85 92 85 106  BILITOT 2.4* 3.1* 2.6* 2.8*  PROT 6.6 6.1* 5.9* 6.0*  ALBUMIN 2.1* 2.0* 1.8* 1.7*  1.7*   No results for input(s): LIPASE, AMYLASE in the last 168 hours. Recent Labs  Lab 06/16/19 1344 06/17/19 2119  AMMONIA 30 33   Diabetic: No results for input(s): HGBA1C in the last 72 hours. Recent Labs  Lab 06/16/19 1350 06/17/19 0605  GLUCAP 96 89   Cardiac Enzymes: Recent Labs  Lab 06/18/19 0739  CKTOTAL 38*   No results for input(s): PROBNP in the last 8760 hours. Coagulation Profile: Recent Labs  Lab 06/16/19 1344 06/17/19 1046 06/18/19 0739  INR 1.5* 1.4* 1.4*   Thyroid Function Tests: Recent Labs    06/16/19 2350  TSH 1.769   Lipid Profile: No results for input(s): CHOL, HDL, LDLCALC, TRIG, CHOLHDL, LDLDIRECT in the last 72 hours. Anemia Panel: Recent Labs    06/16/19 2350 06/17/19 1046  VITAMINB12 824 1,271*  FOLATE  --  87.0  FERRITIN  --  85  TIBC  --  351  IRON  --  42*  RETICCTPCT  --  1.6   Urine analysis:    Component Value Date/Time   COLORURINE AMBER (A) 06/16/2019 1317   APPEARANCEUR CLEAR 06/16/2019 1317   LABSPEC 1.019 06/16/2019 1317   PHURINE 5.0 06/16/2019 1317   GLUCOSEU NEGATIVE 06/16/2019 1317   HGBUR NEGATIVE 06/16/2019 1317   BILIRUBINUR NEGATIVE 06/16/2019 1317   BILIRUBINUR small 01/28/2017 1422   KETONESUR NEGATIVE 06/16/2019 1317   PROTEINUR NEGATIVE 06/16/2019 1317   UROBILINOGEN >=8.0 (A) 01/28/2017 1422   UROBILINOGEN 0.2 01/05/2013 0716   NITRITE NEGATIVE 06/16/2019 1317   LEUKOCYTESUR NEGATIVE 06/16/2019 1317   Sepsis Labs: Invalid input(s): PROCALCITONIN, LACTICIDVEN  Microbiology: Recent Results (from the past 240 hour(s))  Respiratory Panel by RT PCR (Flu A&B, Covid) - Nasopharyngeal Swab     Status: None   Collection Time: 06/16/19  2:07 PM   Specimen: Nasopharyngeal Swab  Result Value Ref Range Status   SARS Coronavirus 2 by RT PCR NEGATIVE NEGATIVE Final     Comment: (NOTE) SARS-CoV-2 target nucleic acids are NOT DETECTED. The SARS-CoV-2 RNA is generally detectable in upper respiratoy specimens during the acute phase of infection. The lowest concentration of SARS-CoV-2 viral copies this assay can detect is 131 copies/mL. A negative result does not preclude SARS-Cov-2 infection and should not be used as the sole basis for treatment or other patient management decisions. A negative result may occur with  improper specimen collection/handling, submission of specimen other than nasopharyngeal  swab, presence of viral mutation(s) within the areas targeted by this assay, and inadequate number of viral copies (<131 copies/mL). A negative result must be combined with clinical observations, patient history, and epidemiological information. The expected result is Negative. Fact Sheet for Patients:  https://www.moore.com/ Fact Sheet for Healthcare Providers:  https://www.young.biz/ This test is not yet ap proved or cleared by the [place] FDA and  has been authorized for detection and/or diagnosis of SARS-CoV-2 by FDA under an Emergency Use Authorization (EUA). This EUA will remain  in effect (meaning this test can be used) for the duration of the COVID-19 declaration under Section 564(b)(1) of the Act, 21 U.S.C. section 360bbb-3(b)(1), unless the authorization is terminated or revoked sooner.    Influenza A by PCR NEGATIVE NEGATIVE Final   Influenza B by PCR NEGATIVE NEGATIVE Final    Comment: (NOTE) The Xpert Xpress SARS-CoV-2/FLU/RSV assay is intended as an aid in  the diagnosis of influenza from Nasopharyngeal swab specimens and  should not be used as a sole basis for treatment. Nasal washings and  aspirates are unacceptable for Xpert Xpress SARS-CoV-2/FLU/RSV  testing. Fact Sheet for Patients: https://www.moore.com/ Fact Sheet for Healthcare  Providers: https://www.young.biz/ This test is not yet approved or cleared by the [place] FDA and  has been authorized for detection and/or diagnosis of SARS-CoV-2 by  FDA under an Emergency Use Authorization (EUA). This EUA will remain  in effect (meaning this test can be used) for the duration of the  Covid-19 declaration under Section 564(b)(1) of the Act, 21  U.S.C. section 360bbb-3(b)(1), unless the authorization is  terminated or revoked. Performed at [facility], [address] [place], [place] [address]   Culture, blood (routine x 2)     Status: None (Preliminary result)   Collection Time: 06/16/19  3:50 PM   Specimen: BLOOD RIGHT ARM  Result Value Ref Range Status   Specimen Description BLOOD RIGHT ARM  Final   Special Requests   Final    BOTTLES DRAWN AEROBIC AND ANAEROBIC Blood Culture adequate volume   Culture  Setup Time   Final    GRAM POSITIVE COCCI IN CHAINS IN BOTH AEROBIC AND ANAEROBIC BOTTLES CRITICAL RESULT CALLED TO, READ BACK BY AND VERIFIED WITH: Lieutenant Diego [id number] 06/17/2019 Girtha Hake Performed at [facility], [address] [place], [place] [address]    Culture GRAM POSITIVE COCCI  Final   Report Status PENDING  Incomplete  Culture, blood (routine x 2)     Status: None (Preliminary result)   Collection Time: 06/16/19  3:57 PM   Specimen: BLOOD RIGHT ARM  Result Value Ref Range Status   Specimen Description BLOOD RIGHT ARM  Final   Special Requests   Final    BOTTLES DRAWN AEROBIC AND ANAEROBIC Blood Culture adequate volume   Culture  Setup Time   Final    GRAM POSITIVE COCCI IN CHAINS IN BOTH AEROBIC AND ANAEROBIC BOTTLES CRITICAL RESULT CALLED TO, READ BACK BY AND VERIFIED WITH: Lieutenant Diego [id number] 06/17/2019 Girtha Hake Performed at [facility], [address] [place], [place] [address]    Culture GRAM POSITIVE COCCI  Final   Report Status PENDING  Incomplete  Blood Culture ID Panel (Reflexed)      Status: Abnormal   Collection Time: 06/16/19  3:57 PM  Result Value Ref Range Status   Enterococcus species NOT DETECTED NOT DETECTED Final   Listeria monocytogenes NOT DETECTED NOT DETECTED Final   Staphylococcus species  NOT DETECTED NOT DETECTED Final   Staphylococcus aureus (BCID) NOT DETECTED NOT DETECTED Final   Streptococcus species DETECTED (A) NOT DETECTED Final    Comment: Not Enterococcus species, Streptococcus agalactiae, Streptococcus pyogenes, or Streptococcus pneumoniae. CRITICAL RESULT CALLED TO, READ BACK BY AND VERIFIED WITH: G. ABBOTT,PHARMD [id number] 06/17/2019 T. TYSOR    Streptococcus agalactiae NOT DETECTED NOT DETECTED Final   Streptococcus pneumoniae NOT DETECTED NOT DETECTED Final   Streptococcus pyogenes NOT DETECTED NOT DETECTED Final   Acinetobacter baumannii NOT DETECTED NOT DETECTED Final   Enterobacteriaceae species NOT DETECTED NOT DETECTED Final   Enterobacter cloacae complex NOT DETECTED NOT DETECTED Final   Escherichia coli NOT DETECTED NOT DETECTED Final   Klebsiella oxytoca NOT DETECTED NOT DETECTED Final   Klebsiella pneumoniae NOT DETECTED NOT DETECTED Final   Proteus species NOT DETECTED NOT DETECTED Final   Serratia marcescens NOT DETECTED NOT DETECTED Final   Haemophilus influenzae NOT DETECTED NOT DETECTED Final   Neisseria meningitidis NOT DETECTED NOT DETECTED Final   Pseudomonas aeruginosa NOT DETECTED NOT DETECTED Final   Candida albicans NOT DETECTED NOT DETECTED Final   Candida glabrata NOT DETECTED NOT DETECTED Final   Candida krusei NOT DETECTED NOT DETECTED Final   Candida parapsilosis NOT DETECTED NOT DETECTED Final   Candida tropicalis NOT DETECTED NOT DETECTED Final    Comment: Performed at [facility], [address] [place], [place] [address]  SARS CORONAVIRUS 2 (TAT 6-24 HRS) Nasopharyngeal Nasopharyngeal Swab     Status: None   Collection Time: 06/16/19  7:13 PM   Specimen: Nasopharyngeal Swab  Result Value Ref Range  Status   SARS Coronavirus 2 NEGATIVE NEGATIVE Final    Comment: (NOTE) SARS-CoV-2 target nucleic acids are NOT DETECTED. The SARS-CoV-2 RNA is generally detectable in upper and lower respiratory specimens during the acute phase of infection. Negative results do not preclude SARS-CoV-2 infection, do not rule out co-infections with other pathogens, and should not be used as the sole basis for treatment or other patient management decisions. Negative results must be combined with clinical observations, patient history, and epidemiological information. The expected result is Negative. Fact Sheet for Patients: HairSlick.nohttps://www.fda.gov/media/138098/download Fact Sheet for Healthcare Providers: quierodirigir.comhttps://www.fda.gov/media/138095/download This test is not yet approved or cleared by the [place] FDA and  has been authorized for detection and/or diagnosis of SARS-CoV-2 by FDA under an Emergency Use Authorization (EUA). This EUA will remain  in effect (meaning this test can be used) for the duration of the COVID-19 declaration under Section 56 4(b)(1) of the Act, 21 U.S.C. section 360bbb-3(b)(1), unless the authorization is terminated or revoked sooner. Performed at [facility], [address] [place], [place] [address]     Radiology Studies: ECHOCARDIOGRAM COMPLETE  Result Date: 06/17/2019    ECHOCARDIOGRAM REPORT   Patient Name:   Meda CoffeeSTANFORD N Zayas Date of Exam: 06/17/2019 Medical Rec #:  [id number]           Height:       67.0 in Accession #:    [id number]          Weight:       133.5 lb Date of Birth:  09/18/1965            BSA:          1.70 m Patient Age:    53 years            BP:           122/58 mmHg Patient  Gender: M                   HR:           96 bpm. Exam Location:  Inpatient Procedure: 2D Echo, Color Doppler and Cardiac Doppler Indications:    Bacteremia R78.81  History:        Patient has no prior history of Echocardiogram examinations.                  Signs/Symptoms:Bacteremia.  Sonographer:    Irving Burton Senior RDCS Referring Phys: [id number] Boyce Medici Emmauel Hallums IMPRESSIONS  1. No obvious SBE if suspision high can consider TEE.  2. Left ventricular ejection fraction, by estimation, is 60 to 65%. The left ventricle has normal function. The left ventricle has no regional wall motion abnormalities. Left ventricular diastolic parameters were normal.  3. Right ventricular systolic function is normal. The right ventricular size is normal. There is moderately elevated pulmonary artery systolic pressure.  4. No subcostal imaging due to neck brace.  5. The mitral valve is normal in structure and function. Mild mitral valve regurgitation. No evidence of mitral stenosis.  6. The aortic valve is tricuspid. Aortic valve regurgitation is not visualized. No aortic stenosis is present. FINDINGS  Left Ventricle: Left ventricular ejection fraction, by estimation, is 60 to 65%. The left ventricle has normal function. The left ventricle has no regional wall motion abnormalities. The left ventricular internal cavity size was normal in size. There is  no left ventricular hypertrophy. Left ventricular diastolic parameters were normal. Right Ventricle: The right ventricular size is normal. No increase in right ventricular wall thickness. Right ventricular systolic function is normal. There is moderately elevated pulmonary artery systolic pressure. The tricuspid regurgitant velocity is 2.63 m/s, and with an assumed right atrial pressure of 15 mmHg, the estimated right ventricular systolic pressure is 42.7 mmHg. Left Atrium: Left atrial size was normal in size. Right Atrium: Right atrial size was normal in size. Pericardium: There is no evidence of pericardial effusion. Mitral Valve: The mitral valve is normal in structure and function. There is moderate thickening of the mitral valve leaflet(s). Normal mobility of the mitral valve leaflets. Mild mitral valve regurgitation. No evidence of mitral valve  stenosis. There is  no evidence of mitral valve vegetation. Tricuspid Valve: The tricuspid valve is normal in structure. Tricuspid valve regurgitation is mild . No evidence of tricuspid stenosis. There is no evidence of tricuspid valve vegetation. Aortic Valve: The aortic valve is tricuspid. Aortic valve regurgitation is not visualized. No aortic stenosis is present. Aortic valve mean gradient measures 9.0 mmHg. Aortic valve peak gradient measures 18.0 mmHg. Aortic valve area, by VTI measures 3.11  cm. There is no evidence of aortic valve vegetation. Pulmonic Valve: The pulmonic valve was normal in structure. Pulmonic valve regurgitation is trivial. No evidence of pulmonic stenosis. There is no evidence of pulmonic valve vegetation. Aorta: The aortic root is normal in size and structure. IAS/Shunts: The interatrial septum was not well visualized.  LEFT VENTRICLE PLAX 2D LVIDd:         5.00 cm   Diastology LVIDs:         3.10 cm   LV e' lateral:   10.40 cm/s LV PW:         1.10 cm   LV E/e' lateral: 7.6 LV IVS:        0.80 cm   LV e' medial:    13.20 cm/s LVOT diam:  2.10 cm   LV E/e' medial:  6.0 LV SV:         104.95 ml LV SV Index:   47.54 LVOT Area:     3.46 cm  RIGHT VENTRICLE RV S prime:     15.30 cm/s TAPSE (M-mode): 2.7 cm LEFT ATRIUM             Index       RIGHT ATRIUM           Index LA diam:        3.50 cm 2.06 cm/m  RA Area:     15.30 cm LA Vol (A2C):   73.1 ml 42.92 ml/m RA Volume:   38.30 ml  22.49 ml/m LA Vol (A4C):   57.2 ml 33.59 ml/m LA Biplane Vol: 71.1 ml 41.75 ml/m  AORTIC VALVE AV Area (Vmax):    3.35 cm AV Area (Vmean):   3.19 cm AV Area (VTI):     3.11 cm AV Vmax:           212.00 cm/s AV Vmean:          140.000 cm/s AV VTI:            0.337 m AV Peak Grad:      18.0 mmHg AV Mean Grad:      9.0 mmHg LVOT Vmax:         205.00 cm/s LVOT Vmean:        129.000 cm/s LVOT VTI:          0.303 m LVOT/AV VTI ratio: 0.90  AORTA Ao Root diam: 3.10 cm Ao Asc diam:  2.90 cm MITRAL VALVE                TRICUSPID VALVE MV Area (PHT): 3.65 cm    TR Peak grad:   27.7 mmHg MV Decel Time: 208 msec    TR Vmax:        263.00 cm/s MV E velocity: 79.20 cm/s MV A velocity: 65.60 cm/s  SHUNTS MV E/A ratio:  1.21        Systemic VTI:  0.30 m                            Systemic Diam: 2.10 cm Charlton HawsPeter Nishan MD Electronically signed by Charlton HawsPeter Nishan MD Signature Date/Time: 06/17/2019/2:11:51 PM    Final      Jolisa Intriago T. Kden Wagster [facility]  If 7PM-7AM, please contact night-coverage [email or web address] Password [email or web address] 06/18/2019, 10:42 AM

## 2019-06-18 NOTE — Progress Notes (Signed)
Occupational Therapy Evaluation Patient Details Name: Theodore Brown MRN: [id number] DOB: 08-24-1965 Today's Date: 06/18/2019    History of Present Illness 54 yo admitted with neck pain and paraesthesias after 2 recent falls. Pt with metabolic encephalopathy and cervical facet fx. PMHx: ETOH abuse, arthritis, cirrhosis   Clinical Impression   PTA, pt was living at home alone, he reports he was independent with ADL/IADL and was independent with functional mobility. Pt reports he was not driving and would either have assistance from others or use public transportation. Pt currently requires minA for ADL completion and functional mobiltiy. He requires mod cues for adherence to cervical precautions and minA for stability, pt with minor loss of balance upon initial standing. Pt presents with decreased knowledge of precautions, instability, and history of falls and history of ETOH abuse putting him at an increased safety risk.  Due to decline in current level of function, pt would benefit from acute OT to address established goals to facilitate safe D/C to venue listed below. At this time, recommend SNF follow-up. Should pt return home he would need 24/7 assistance. Will continue to follow acutely.     Follow Up Recommendations  SNF;Supervision/Assistance - 24 hour    Equipment Recommendations  None recommended by OT    Recommendations for Other Services       Precautions / Restrictions Precautions Precautions: Cervical;Fall Precaution Booklet Issued: Yes (comment) Required Braces or Orthoses: Cervical Brace Cervical Brace: Hard collar;At all times Restrictions Weight Bearing Restrictions: No      Mobility Bed Mobility Overal bed mobility: Needs Assistance Bed Mobility: Rolling;Sidelying to Sit;Sit to Sidelying Rolling: Supervision Sidelying to sit: Min assist Supine to sit: Supervision Sit to supine: Supervision Sit to sidelying: Supervision General bed mobility comments: minA  to progress trunk upright  Transfers Overall transfer level: Needs assistance Equipment used: None Transfers: Sit to/from Stand Sit to Stand: Min assist         General transfer comment: minA for stability in standing    Balance Overall balance assessment: History of Falls                                         ADL either performed or assessed with clinical judgement   ADL Overall ADL's : Needs assistance/impaired Eating/Feeding: Independent   Grooming: Min guard Grooming Details (indicate cue type and reason): standing at sink level;cues for safety and cervical precautions Upper Body Bathing: Sitting;Supervision/ safety   Lower Body Bathing: Minimal assistance;Sit to/from stand   Upper Body Dressing : Minimal assistance Upper Body Dressing Details (indicate cue type and reason): don/doff cervical collar Lower Body Dressing: Sit to/from stand;Minimal assistance   Toilet Transfer: Ambulation;Minimal assistance Toilet Transfer Details (indicate cue type and reason): simulated in room;pt used urinal upon arrival Toileting- Clothing Manipulation and Hygiene: Sit to/from stand;Minimal assistance       Functional mobility during ADLs: Cueing for safety;Supervision/safety General ADL Comments: reviewed cervical precautions with pt with provided handout;pt required mod vc for adherence to precautions during ADL;pt with instability with initial standing requiring assist from therapist to stabilize;unsteady during ambulation     Vision Patient Visual Report: No change from baseline       Perception     Praxis      Pertinent Vitals/Pain Pain Assessment: Faces Pain Score: 7  Faces Pain Scale: Hurts little more Pain Location: pt declined pain but grimacing with movement  of arms; Pain Descriptors / Indicators: Grimacing;Guarding Pain Intervention(s): Monitored during session;Limited activity within patient's tolerance     Hand Dominance Right    Extremity/Trunk Assessment Upper Extremity Assessment Upper Extremity Assessment: Generalized weakness;RUE deficits/detail;LUE deficits/detail RUE Deficits / Details: grip strength 4/5;able to maintain grasp and coordinated movements during ADL;no reports of tingling or numbness;limited assessment due to cervical precautions;pt reports feels close to baseline RUE Sensation: WNL RUE Coordination: WNL LUE Deficits / Details: grip strength 4/5;limited assessment due to cervical precautions LUE Sensation: WNL LUE Coordination: WNL   Lower Extremity Assessment Lower Extremity Assessment: Defer to PT evaluation   Cervical / Trunk Assessment Cervical / Trunk Assessment: Other exceptions Cervical / Trunk Exceptions: cervical collar   Communication Communication Communication: No difficulties   Cognition Arousal/Alertness: Awake/alert Behavior During Therapy: WFL for tasks assessed/performed Overall Cognitive Status: Within Functional Limits for tasks assessed                                 General Comments: pt needing cues to maintain chin on craddle of collar   General Comments  HR up to 126 with mobility    Exercises     Shoulder Instructions      Home Living Family/patient expects to be discharged to:: Private residence Living Arrangements: Alone Available Help at Discharge: Family;Friend(s);Available PRN/intermittently Type of Home: Apartment Home Access: Level entry     Home Layout: One level     Bathroom Shower/Tub: Occupational psychologist: Standard     Home Equipment: None          Prior Functioning/Environment Level of Independence: Independent        Comments: doesn't drive or work currently        OT Problem List: Decreased activity tolerance;Impaired balance (sitting and/or standing);Decreased safety awareness;Decreased knowledge of precautions;Cardiopulmonary status limiting activity;Pain      OT Treatment/Interventions:  Self-care/ADL training;Therapeutic exercise;Energy conservation;DME and/or AE instruction;Therapeutic activities;Patient/family education;Balance training    OT Goals(Current goals can be found in the care plan section) Acute Rehab OT Goals Patient Stated Goal: return home and get medicaid started OT Goal Formulation: With patient Time For Goal Achievement: 07/02/19 Potential to Achieve Goals: Good ADL Goals Pt Will Perform Grooming: with modified independence;standing Pt Will Perform Lower Body Dressing: with modified independence;sit to/from stand Pt Will Transfer to Toilet: with modified independence;ambulating Pt Will Perform Tub/Shower Transfer: Tub transfer;with modified independence Additional ADL Goal #1: Pt will demonstrate independence with 3 fall prevention strategies. Additional ADL Goal #2: Pt will demonstrate independence with adherence to 3/3 cervical precautions during ADL/IADL and functional mobiltiy.  OT Frequency: Min 2X/week   Barriers to D/C: Decreased caregiver support  pt lives alone       Co-evaluation              AM-PAC OT "6 Clicks" Daily Activity     Outcome Measure Help from another person eating meals?: A Little Help from another person taking care of personal grooming?: A Little Help from another person toileting, which includes using toliet, bedpan, or urinal?: A Little Help from another person bathing (including washing, rinsing, drying)?: A Little Help from another person to put on and taking off regular upper body clothing?: A Little Help from another person to put on and taking off regular lower body clothing?: A Little 6 Click Score: 18   End of Session Equipment Utilized During Treatment: Cervical collar Nurse Communication: Mobility  status  Activity Tolerance: Patient tolerated treatment well Patient left: in bed;with call bell/phone within reach;with bed alarm set;with family/visitor present  OT Visit Diagnosis: Unsteadiness on feet  (R26.81);Other abnormalities of gait and mobility (R26.89);Muscle weakness (generalized) (M62.81);History of falling (Z91.81)                Time: 9924-2683 OT Time Calculation (min): 15 min Charges:  OT General Charges $OT Visit: 1 Visit OT Evaluation $OT Eval Moderate Complexity: 1 Mod  Theodore Brown OTR/L Acute Rehabilitation Services Office: [phone]   Theodore Brown 06/18/2019, 1:36 PM

## 2019-06-19 LAB — RENAL FUNCTION PANEL
Albumin: 1.6 g/dL — ABNORMAL LOW (ref 3.5–5.0)
Anion gap: 10 (ref 5–15)
BUN: 9 mg/dL (ref 6–20)
CO2: 21 mmol/L — ABNORMAL LOW (ref 22–32)
Calcium: 7.9 mg/dL — ABNORMAL LOW (ref 8.9–10.3)
Chloride: 99 mmol/L (ref 98–111)
Creatinine, Ser: 0.85 mg/dL (ref 0.61–1.24)
GFR calc Af Amer: >60 mL/min (ref >60)
GFR calc non Af Amer: >60 mL/min (ref >60)
Glucose, Bld: 93 mg/dL (ref 70–99)
Phosphorus: 2.2 mg/dL — ABNORMAL LOW (ref 2.5–4.6)
Potassium: 3.2 mmol/L — ABNORMAL LOW (ref 3.5–5.1)
Sodium: 130 mmol/L — ABNORMAL LOW (ref 135–145)

## 2019-06-19 LAB — CBC
HCT: 21.9 % — ABNORMAL LOW (ref 39.0–52.0)
Hemoglobin: 7 g/dL — ABNORMAL LOW (ref 13.0–17.0)
MCH: 21.1 pg — ABNORMAL LOW (ref 26.0–34.0)
MCHC: 32 g/dL (ref 30.0–36.0)
MCV: 66.2 fL — ABNORMAL LOW (ref 80.0–100.0)
Platelets: 69 10*3/uL — ABNORMAL LOW (ref 150–400)
RBC: 3.31 MIL/uL — ABNORMAL LOW (ref 4.22–5.81)
RDW: 25 % — ABNORMAL HIGH (ref 11.5–15.5)
WBC: 17.7 10*3/uL — ABNORMAL HIGH (ref 4.0–10.5)
nRBC: 2.8 % — ABNORMAL HIGH (ref 0.0–0.2)

## 2019-06-19 LAB — VANCOMYCIN, TROUGH: Vancomycin Tr: 6 ug/mL — ABNORMAL LOW (ref 15–20)

## 2019-06-19 LAB — HEMOGLOBIN AND HEMATOCRIT, BLOOD
HCT: 23 % — ABNORMAL LOW (ref 39.0–52.0)
HCT: 23.9 % — ABNORMAL LOW (ref 39.0–52.0)
Hemoglobin: 7.1 g/dL — ABNORMAL LOW (ref 13.0–17.0)
Hemoglobin: 7.5 g/dL — ABNORMAL LOW (ref 13.0–17.0)

## 2019-06-19 LAB — PROTIME-INR
INR: 1.4 — ABNORMAL HIGH (ref 0.8–1.2)
Prothrombin Time: 16.6 seconds — ABNORMAL HIGH (ref 11.4–15.2)

## 2019-06-19 LAB — FOLATE RBC
Folate, Hemolysate: 377 ng/mL
Folate, RBC: 1439 ng/mL (ref >498)
Hematocrit: 26.2 % — ABNORMAL LOW (ref 37.5–51.0)

## 2019-06-19 LAB — HAPTOGLOBIN: Haptoglobin: 136 mg/dL (ref 29–370)

## 2019-06-19 LAB — VANCOMYCIN, PEAK: Vancomycin Pk: 21 ug/mL — ABNORMAL LOW (ref 30–40)

## 2019-06-19 LAB — MAGNESIUM: Magnesium: 1.9 mg/dL (ref 1.7–2.4)

## 2019-06-19 MED ORDER — POTASSIUM CHLORIDE CRYS ER 20 MEQ PO TBCR
40.0000 meq | EXTENDED_RELEASE_TABLET | Freq: Three times a day (TID) | ORAL | Status: AC
  Administered 2019-06-19 (×2): 40 meq via ORAL
  Filled 2019-06-19 (×2): qty 2

## 2019-06-19 MED ORDER — VANCOMYCIN HCL 1250 MG/250ML IV SOLN
1250.0000 mg | Freq: Two times a day (BID) | INTRAVENOUS | Status: DC
  Administered 2019-06-19 – 2019-06-20 (×2): 1250 mg via INTRAVENOUS
  Filled 2019-06-19 (×2): qty 250

## 2019-06-19 NOTE — Progress Notes (Signed)
Pharmacy Antibiotic Note  Theodore Brown is a 54 y.o. male admitted on 06/16/2019 s/p fall with neck pain along with paresthesias.  Patient was started on Rocephin for Streptococcus species bacteremia and Pharmacy now consulted to add vancomycin.  Cr stable, vancomycin levels show subtherapeutic AUC of 287.  Plan: -Increase vancomycin to 1250mg  IV q12h - est AUC 476  Height: 5\' 7"  (170.2 cm) Weight: 133 lb 8 oz (60.6 kg)(scale a) IBW/kg (Calculated) : 66.1  Temp (24hrs), Avg:99.2 F (37.3 C), Min:98.1 F (36.7 C), Max:100.1 F (37.8 C)  Recent Labs  Lab 06/16/19 1234 06/16/19 1344 06/16/19 1516 06/16/19 2350 06/17/19 0719 06/17/19 1642 06/18/19 0739 06/19/19 0416 06/19/19 0742 06/19/19 1630  WBC 23.2*  --   --  17.7* 16.4*  --  17.1* 17.7*  --   --   CREATININE 1.34*  --   --  1.18 0.99  --  0.80 0.85  --   --   LATICACIDVEN  --  3.9* 3.1*  --   --  2.1*  --   --   --   --   VANCOTROUGH  --   --   --   --   --   --   --   --   --  6*  VANCOPEAK  --   --   --   --   --   --   --   --  21*  --     Estimated Creatinine Clearance: 87.1 mL/min (by C-G formula based on SCr of 0.85 mg/dL).    No Known Allergies  CTX 2/12 >> Vanc 2/12 >>  2/11 BCx - [facility] (BCID Strep spp) 2/11 COVID: negative   4/11, PharmD, BCPS Clinical Pharmacist [phone] Please check AMION for all [facility] Pharmacy numbers 06/19/2019

## 2019-06-19 NOTE — Progress Notes (Signed)
PROGRESS NOTE  Theodore Brown JAS:[id number] DOB: [birth year]   PCP: Loletta Specter, PA-C  Patient is from: Home.  DOA: 06/16/2019 LOS: 3  Brief Narrative / Interim history: 54 year old male with history of alcohol abuse, liver cirrhosis, osteoarthritis and prior neck surgery presenting with neck pain and bilateral upper extremity paresthesia after fall 2 days ago.  Reportedly fell backward and hit his head without loss of consciousness.  Tred to manage his neck pain at home before he presented to ED.  In ED, hypotensive to 86/58 and tachycardic to 122.  But improved with IV fluid.  Afebrile.  Normal saturation on room air.  Hgb 7 (baseline 10.2).  MCV 66.  WBC 23.2. Na 124.  K3.4.  Cr 1.34 (b/l 0.92).  EKG LVH with bilateral atrial enlargement.  CTA head and neck without acute finding but degenerative disc and facet disease.  CTA chest/abdomen/pelvis revealed cirrhotic liver with bulky abdominal varices, splenomegaly, trace ascites and chronic bilateral rib fractures.  MRI cervical spine ordered.  Patient was admitted for SIRS, acute encephalopathy, volume depletion, cervical radiculopathy and electrolyte derangement.  The next morning, blood cultures grew GPC in chains.  ID consulted.  Started on IV ceftriaxone and vancomycin.  Repeat blood culture ordered to be drawn at 24 hours after antibiotic initiation.  Echocardiogram with normal EF and no vegetation.  MRI cervical spine revealed acute cervical spine injury with evidence of bilateral C2-C3 facet disruption, C5-C6 disc space disruption, blood or fluid within the spinal canal from C2-C3 to C6-C7 contributing to spinal stenosis with cord mass-effect but no definite spinal cord contusion or edema and bulky prevertebral blood or fluid suspicious for anterior ligamentous injury.  Neurosurgery consulted and recommended Aspen collar, conservative management and outpatient follow-up in 6 weeks for flexion-extension C-spine x-rays.     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 -Still with pain but improving. Continue pain control-Tylenol, Flexeril, oxycodone and fentanyl -SCD for VTE prophylaxis in the setting of bleeding and thrombocytopenia -PT/OT eval. interestingly, OT recommends SNF but PT recommends outpatient PT.  Severe sepsis due to Streptococcus bacteremia: Sepsis physiology improving.  Likely odontogenic source.  Denies IVDU.  TTE without significant finding or vegetation, unable to have TEE due to cervical fracture. -Appreciate guidance by ID -Continue ceftriaxone and vancomycin 2/12> repeat blood culture from yesterday are negative thus far. -Follow cultures speciation and sensitivity  Alcoholic liver cirrhosis/varices/splenomegaly Alcohol use disorder-admits drinking 3-4 beers a day. Elevated liver enzyme/hyperbilirubinemia-likely due to alcohol.  CK, acute hepatitis panel and HIV negative. Coagulopathy: INR 1.4.  Likely due to liver cirrhosis. -Encouraged cessation -CIWA with as needed Ativan. No signs of withdrawal currently. -Continue multivitamins and thiamine after banana bag  Acute metabolic encephalopathy: Likely due to the above.  Resolved. -Treat treatable causes  Acute kidney injury: Likely due to sepsis and dehydration.  Resolved.  Hyponatremia/hypophosphatemia: Likely due to alcohol abuse. -Replenish and recheck  Hypokalemia/hypomagnesemia-hypomagnesia resolved. Potassium 3.2 again today. Will replace today.  Iron deficiency anemia: Baseline Hgb 8-9> 7.0 (admit) > 7.9>7.7>7.5 >7.6 >7.0>7.1.  Likely due to alcohol. LDH elevated. Haptoglobin pending. So far work-up indicates iron deficiency anemia. Has microcytosis as well. Continue oral ferrous sulfate. Monitor CBC and transfuse if  hemoglobin drops less than 7.  Leukocytosis/bandemia: Likely due to bacteremia.  Improving. -Continue monitoring  Thrombocytopenia: Likely due to alcohol.  About baseline. No signs of bleeding. -  Continue monitoring.   DVT prophylaxis: SCD due to epidural bleed and thrombocytopenia Code Status: Full code Family Communication: Discussed in length with patient. He is alert, oriented and competent. No family present.  Discharge barrier: Sepsis/bacteremia/electrolyte derangement/final blood culture sensitivities Patient is from: Home Final disposition: To be determined after PT/OT eval. so far it is confusing. OT recommends SNF however PT recommends outpatient PT.  Consultants: Neurosurgery    Subjective: Seen and examined. Continues to complain of neck pain and bilateral shoulder pain but improving compared to yesterday. No other complaint.  Objective: Vitals:   06/18/19 1926 06/18/19 2325 06/19/19 0415 06/19/19 0854  BP: (!) 147/71 (!) 153/69 120/70 (!) 141/65  Pulse: 91 (!) 104 100 88  Resp: 18 16  18   Temp: 99.4 F (37.4 C) 99.6 F (37.6 C) 99.3 F (37.4 C) 98.6 F (37 C)  TempSrc: Oral Oral Oral Oral  SpO2: 96% 95% 100% 97%  Weight:      Height:        Intake/Output Summary (Last 24 hours) at 06/19/2019 1250 Last data filed at 06/19/2019 0400 Gross per 24 hour  Intake 2467.35 ml  Output 1025 ml  Net 1442.35 ml   Filed Weights   06/16/19 1228 06/17/19 0030  Weight: 63.5 kg 60.6 kg    Examination:  General exam: Appears calm and comfortable, Aspen collar in place Respiratory system: Diminished breath sounds at bases due to poor inspiratory effort Cardiovascular system: S1 & S2 heard, RRR. No JVD, murmurs, rubs, gallops or clicks. No pedal edema. Gastrointestinal system: Abdomen is nondistended, soft and nontender. No organomegaly or masses felt. Normal bowel sounds heard. Central nervous system: Alert and oriented. No focal neurological deficits. Extremities:  Symmetric 5 x 5 power. Skin: No rashes, lesions or ulcers.  Psychiatry: Judgement and insight appear normal. Mood & affect appropriate.   Procedures:  None   Microbiology summarized: COVID-19 negative. Influenza PCR negative. Blood cultures GPC in chains in 4 out of 4 bottles.  Sch Meds:  Scheduled Meds: . ferrous sulfate  325 mg Oral BID WC  . [START ON 06/20/2019] folic acid  5 mg Oral Daily  . [START ON 06/20/2019] multivitamin with minerals  1 tablet Oral Daily  . [START ON 06/20/2019] thiamine  100 mg Oral Daily   Or  . [START ON 06/20/2019] thiamine  100 mg Intravenous Daily   Continuous Infusions: . cefTRIAXone (ROCEPHIN)  IV 2 g (06/19/19 0844)  . vancomycin 750 mg (06/19/19 0559)   PRN Meds:.acetaminophen, cyclobenzaprine, fentaNYL (SUBLIMAZE) injection, iohexol, LORazepam **OR** LORazepam, oxyCODONE  Antimicrobials: Anti-infectives (From admission, onward)   Start     Dose/Rate Route Frequency Ordered Stop   06/18/19 0600  vancomycin (VANCOREADY) IVPB 750 mg/150 mL     750 mg 150 mL/hr over 60 Minutes Intravenous Every 12 hours 06/17/19 1615     06/17/19 1630  vancomycin (VANCOREADY) IVPB 1250 mg/250 mL     1,250 mg 166.7 mL/hr over 90 Minutes Intravenous  Once 06/17/19 1615 06/17/19 1837   06/17/19 0800  cefTRIAXone (ROCEPHIN) 2 g in sodium chloride 0.9 % 100 mL IVPB     2 g 200 mL/hr over 30 Minutes Intravenous Every 24 hours 06/17/19 0639         I have personally reviewed the following labs and images: CBC: Recent Labs  Lab 06/16/19 1234 06/16/19 1234 06/16/19 2350 06/16/19 2350 06/17/19 0719 06/17/19 1501 06/18/19 0739 06/19/19 0416 06/19/19 1203  WBC 23.2*  --  17.7*  --  16.4*  --  17.1* 17.7*  --   HGB 7.0*   < > 7.9*   < > 7.7* 7.5* 7.6* 7.0* 7.1*  HCT 23.6*   < > 25.5*   < > 24.5* 23.9* 25.1* 21.9* 23.0*  MCV 65.7*  --  67.6*  --  66.6*  --  68.2* 66.2*  --   PLT 52*  --  51*  --  50*  --  56* 69*  --    < > = values in this interval not  displayed.   BMP &GFR Recent Labs  Lab 06/16/19 1234 06/16/19 2350 06/17/19 0719 06/17/19 1046 06/18/19 0739 06/19/19 0416  NA 124* 125* 128*  --  127* 130*  K 3.4* 3.3* 3.4*  --  4.0 3.2*  CL 91* 93* 97*  --  97* 99  CO2 22 22 23   --  21* 21*  GLUCOSE 96 82 89  --  94 93  BUN 22* 22* 20  --  10 9  CREATININE 1.34* 1.18 0.99  --  0.80 0.85  CALCIUM 8.1* 7.9* 7.7*  --  7.8* 7.9*  MG  --  2.2  --  2.0 1.9 1.9  PHOS  --  1.7*  --  <1.0* 1.7*  1.6* 2.2*   Estimated Creatinine Clearance: 87.1 mL/min (by C-G formula based on SCr of 0.85 mg/dL). Liver & Pancreas: Recent Labs  Lab 06/16/19 1234 06/16/19 2350 06/17/19 0719 06/18/19 0739 06/19/19 0416  AST 104* 96* 88* 97*  --   ALT 45* 40 41 40  --   ALKPHOS 85 92 85 106  --   BILITOT 2.4* 3.1* 2.6* 2.8*  --   PROT 6.6 6.1* 5.9* 6.0*  --   ALBUMIN 2.1* 2.0* 1.8* 1.7*  1.7* 1.6*   No results for input(s): LIPASE, AMYLASE in the last 168 hours. Recent Labs  Lab 06/16/19 1344 06/17/19 2119  AMMONIA 30 33   Diabetic: No results for input(s): HGBA1C in the last 72 hours. Recent Labs  Lab 06/16/19 1350 06/17/19 0605  GLUCAP 96 89   Cardiac Enzymes: Recent Labs  Lab 06/18/19 0739  CKTOTAL 38*   No results for input(s): PROBNP in the last 8760 hours. Coagulation Profile: Recent Labs  Lab 06/16/19 1344 06/17/19 1046 06/18/19 0739 06/19/19 0416  INR 1.5* 1.4* 1.4* 1.4*   Thyroid Function Tests: Recent Labs    06/16/19 2350  TSH 1.769   Lipid Profile: No results for input(s): CHOL, HDL, LDLCALC, TRIG, CHOLHDL, LDLDIRECT in the last 72 hours. Anemia Panel: Recent Labs    06/16/19 2350 06/17/19 1046  VITAMINB12 824 1,271*  FOLATE  --  87.0  FERRITIN  --  85  TIBC  --  351  IRON  --  42*  RETICCTPCT  --  1.6   Urine analysis:    Component Value Date/Time   COLORURINE AMBER (A) 06/16/2019 1317   APPEARANCEUR CLEAR 06/16/2019 1317   LABSPEC 1.019 06/16/2019 1317   PHURINE 5.0 06/16/2019 1317    GLUCOSEU NEGATIVE 06/16/2019 1317   HGBUR NEGATIVE 06/16/2019 1317   BILIRUBINUR NEGATIVE 06/16/2019 1317   BILIRUBINUR small 01/28/2017 1422   KETONESUR NEGATIVE 06/16/2019 1317   PROTEINUR NEGATIVE 06/16/2019 1317   UROBILINOGEN >=8.0 (A) 01/28/2017 1422   UROBILINOGEN 0.2 01/05/2013 0716   NITRITE NEGATIVE 06/16/2019 1317   LEUKOCYTESUR NEGATIVE 06/16/2019 1317   Sepsis Labs: Invalid input(s): PROCALCITONIN, LACTICIDVEN  Microbiology: Recent Results (from the past 240 hour(s))  Respiratory Panel by RT PCR (Flu A&B, Covid) -  Nasopharyngeal Swab     Status: None   Collection Time: 06/16/19  2:07 PM   Specimen: Nasopharyngeal Swab  Result Value Ref Range Status   SARS Coronavirus 2 by RT PCR NEGATIVE NEGATIVE Final    Comment: (NOTE) SARS-CoV-2 target nucleic acids are NOT DETECTED. The SARS-CoV-2 RNA is generally detectable in upper respiratoy specimens during the acute phase of infection. The lowest concentration of SARS-CoV-2 viral copies this assay can detect is 131 copies/mL. A negative result does not preclude SARS-Cov-2 infection and should not be used as the sole basis for treatment or other patient management decisions. A negative result may occur with  improper specimen collection/handling, submission of specimen other than nasopharyngeal swab, presence of viral mutation(s) within the areas targeted by this assay, and inadequate number of viral copies (<131 copies/mL). A negative result must be combined with clinical observations, patient history, and epidemiological information. The expected result is Negative. Fact Sheet for Patients:  https://www.moore.com/ Fact Sheet for Healthcare Providers:  https://www.young.biz/ This test is not yet ap proved or cleared by the [place] FDA and  has been authorized for detection and/or diagnosis of SARS-CoV-2 by FDA under an Emergency Use Authorization (EUA). This EUA will remain   in effect (meaning this test can be used) for the duration of the COVID-19 declaration under Section 564(b)(1) of the Act, 21 U.S.C. section 360bbb-3(b)(1), unless the authorization is terminated or revoked sooner.    Influenza A by PCR NEGATIVE NEGATIVE Final   Influenza B by PCR NEGATIVE NEGATIVE Final    Comment: (NOTE) The Xpert Xpress SARS-CoV-2/FLU/RSV assay is intended as an aid in  the diagnosis of influenza from Nasopharyngeal swab specimens and  should not be used as a sole basis for treatment. Nasal washings and  aspirates are unacceptable for Xpert Xpress SARS-CoV-2/FLU/RSV  testing. Fact Sheet for Patients: https://www.moore.com/ Fact Sheet for Healthcare Providers: https://www.young.biz/ This test is not yet approved or cleared by the [place] FDA and  has been authorized for detection and/or diagnosis of SARS-CoV-2 by  FDA under an Emergency Use Authorization (EUA). This EUA will remain  in effect (meaning this test can be used) for the duration of the  Covid-19 declaration under Section 564(b)(1) of the Act, 21  U.S.C. section 360bbb-3(b)(1), unless the authorization is  terminated or revoked. Performed at [facility], [address], [place], [place] [address]   Culture, blood (routine x 2)     Status: None (Preliminary result)   Collection Time: 06/16/19  3:50 PM   Specimen: BLOOD RIGHT ARM  Result Value Ref Range Status   Specimen Description BLOOD RIGHT ARM  Final   Special Requests   Final    BOTTLES DRAWN AEROBIC AND ANAEROBIC Blood Culture adequate volume   Culture  Setup Time   Final    GRAM POSITIVE COCCI IN CHAINS IN BOTH AEROBIC AND ANAEROBIC BOTTLES CRITICAL RESULT CALLED TO, READ BACK BY AND VERIFIED WITH: G. ABBOTT,PHARMD 0923 06/17/2019 T. TYSOR    Culture   Final    BETA HEMOLYTIC ORGANISM BEING IDENTIFIED IDENTIFICATION TO FOLLOW Performed at [facility], [address],  [place], [place] [address]    Report Status PENDING  Incomplete  Culture, blood (routine x 2)     Status: None (Preliminary result)   Collection Time: 06/16/19  3:57 PM   Specimen: BLOOD RIGHT ARM  Result Value Ref Range Status   Specimen Description BLOOD RIGHT ARM  Final   Special Requests  Final    BOTTLES DRAWN AEROBIC AND ANAEROBIC Blood Culture adequate volume   Culture  Setup Time   Final    GRAM POSITIVE COCCI IN CHAINS IN BOTH AEROBIC AND ANAEROBIC BOTTLES CRITICAL RESULT CALLED TO, READ BACK BY AND VERIFIED WITH: G. ABBOTT,PHARMD [id number] 06/17/2019 T. TYSOR    Culture   Final    BETA HEMOLYTIC ORGANISM BEING IDENTIFIED IDENTIFICATION TO FOLLOW Performed at [facility], [address] [place], [place] [address]    Report Status PENDING  Incomplete  Blood Culture ID Panel (Reflexed)     Status: Abnormal   Collection Time: 06/16/19  3:57 PM  Result Value Ref Range Status   Enterococcus species NOT DETECTED NOT DETECTED Final   Listeria monocytogenes NOT DETECTED NOT DETECTED Final   Staphylococcus species NOT DETECTED NOT DETECTED Final   Staphylococcus aureus (BCID) NOT DETECTED NOT DETECTED Final   Streptococcus species DETECTED (A) NOT DETECTED Final    Comment: Not Enterococcus species, Streptococcus agalactiae, Streptococcus pyogenes, or Streptococcus pneumoniae. CRITICAL RESULT CALLED TO, READ BACK BY AND VERIFIED WITH: G. ABBOTT,PHARMD [id number] 06/17/2019 T. TYSOR    Streptococcus agalactiae NOT DETECTED NOT DETECTED Final   Streptococcus pneumoniae NOT DETECTED NOT DETECTED Final   Streptococcus pyogenes NOT DETECTED NOT DETECTED Final   Acinetobacter baumannii NOT DETECTED NOT DETECTED Final   Enterobacteriaceae species NOT DETECTED NOT DETECTED Final   Enterobacter cloacae complex NOT DETECTED NOT DETECTED Final   Escherichia coli NOT DETECTED NOT DETECTED Final   Klebsiella oxytoca NOT DETECTED NOT DETECTED Final   Klebsiella pneumoniae NOT DETECTED NOT DETECTED  Final   Proteus species NOT DETECTED NOT DETECTED Final   Serratia marcescens NOT DETECTED NOT DETECTED Final   Haemophilus influenzae NOT DETECTED NOT DETECTED Final   Neisseria meningitidis NOT DETECTED NOT DETECTED Final   Pseudomonas aeruginosa NOT DETECTED NOT DETECTED Final   Candida albicans NOT DETECTED NOT DETECTED Final   Candida glabrata NOT DETECTED NOT DETECTED Final   Candida krusei NOT DETECTED NOT DETECTED Final   Candida parapsilosis NOT DETECTED NOT DETECTED Final   Candida tropicalis NOT DETECTED NOT DETECTED Final    Comment: Performed at [facility], [address] [place], [place] [address]  SARS CORONAVIRUS 2 (TAT 6-24 HRS) Nasopharyngeal Nasopharyngeal Swab     Status: None   Collection Time: 06/16/19  7:13 PM   Specimen: Nasopharyngeal Swab  Result Value Ref Range Status   SARS Coronavirus 2 NEGATIVE NEGATIVE Final    Comment: (NOTE) SARS-CoV-2 target nucleic acids are NOT DETECTED. The SARS-CoV-2 RNA is generally detectable in upper and lower respiratory specimens during the acute phase of infection. Negative results do not preclude SARS-CoV-2 infection, do not rule out co-infections with other pathogens, and should not be used as the sole basis for treatment or other patient management decisions. Negative results must be combined with clinical observations, patient history, and epidemiological information. The expected result is Negative. Fact Sheet for Patients: HairSlick.nohttps://www.fda.gov/media/138098/download Fact Sheet for Healthcare Providers: quierodirigir.comhttps://www.fda.gov/media/138095/download This test is not yet approved or cleared by the [place] FDA and  has been authorized for detection and/or diagnosis of SARS-CoV-2 by FDA under an Emergency Use Authorization (EUA). This EUA will remain  in effect (meaning this test can be used) for the duration of the COVID-19 declaration under Section 56 4(b)(1) of the Act, 21 U.S.C. section 360bbb-3(b)(1),  unless the authorization is terminated or revoked sooner. Performed at [facility], [address] [place],  [place] [address]   Culture, blood (routine x 2)     Status: None (Preliminary result)   Collection Time: 06/18/19  8:19 AM   Specimen: BLOOD LEFT FOREARM  Result Value Ref Range Status   Specimen Description BLOOD LEFT FOREARM  Final   Special Requests   Final    BOTTLES DRAWN AEROBIC ONLY Blood Culture adequate volume   Culture   Final    NO GROWTH < 12 HOURS Performed at [facility], [place] [address], [place], [place] [address]    Report Status PENDING  Incomplete  Culture, blood (routine x 2)     Status: None (Preliminary result)   Collection Time: 06/18/19  8:19 AM   Specimen: BLOOD  Result Value Ref Range Status   Specimen Description BLOOD LEFT ANTECUBITAL  Final   Special Requests   Final    BOTTLES DRAWN AEROBIC ONLY Blood Culture adequate volume   Culture   Final    NO GROWTH < 12 HOURS Performed at [facility], [place] [address] [place],  [address]    Report Status PENDING  Incomplete    Radiology Studies: No results found.  Darliss Cheney, MD [facility] If 7PM-7AM, please contact night-coverage [email or web address] 06/19/2019, 12:50 PM

## 2019-06-20 DIAGNOSIS — S129XXA Fracture of neck, unspecified, initial encounter: Secondary | ICD-10-CM

## 2019-06-20 DIAGNOSIS — F1021 Alcohol dependence, in remission: Secondary | ICD-10-CM

## 2019-06-20 DIAGNOSIS — W19XXXA Unspecified fall, initial encounter: Secondary | ICD-10-CM

## 2019-06-20 DIAGNOSIS — B954 Other streptococcus as the cause of diseases classified elsewhere: Secondary | ICD-10-CM

## 2019-06-20 LAB — CULTURE, BLOOD (ROUTINE X 2)
Special Requests: ADEQUATE
Special Requests: ADEQUATE

## 2019-06-20 LAB — RENAL FUNCTION PANEL
Albumin: 1.6 g/dL — ABNORMAL LOW (ref 3.5–5.0)
Anion gap: 9 (ref 5–15)
BUN: 7 mg/dL (ref 6–20)
CO2: 20 mmol/L — ABNORMAL LOW (ref 22–32)
Calcium: 8.2 mg/dL — ABNORMAL LOW (ref 8.9–10.3)
Chloride: 101 mmol/L (ref 98–111)
Creatinine, Ser: 0.83 mg/dL (ref 0.61–1.24)
GFR calc Af Amer: >60 mL/min (ref >60)
GFR calc non Af Amer: >60 mL/min (ref >60)
Glucose, Bld: 83 mg/dL (ref 70–99)
Phosphorus: 2.4 mg/dL — ABNORMAL LOW (ref 2.5–4.6)
Potassium: 4 mmol/L (ref 3.5–5.1)
Sodium: 130 mmol/L — ABNORMAL LOW (ref 135–145)

## 2019-06-20 LAB — CBC
HCT: 24.6 % — ABNORMAL LOW (ref 39.0–52.0)
Hemoglobin: 7.4 g/dL — ABNORMAL LOW (ref 13.0–17.0)
MCH: 20.7 pg — ABNORMAL LOW (ref 26.0–34.0)
MCHC: 30.1 g/dL (ref 30.0–36.0)
MCV: 68.9 fL — ABNORMAL LOW (ref 80.0–100.0)
Platelets: 100 10*3/uL — ABNORMAL LOW (ref 150–400)
RBC: 3.57 MIL/uL — ABNORMAL LOW (ref 4.22–5.81)
RDW: 26 % — ABNORMAL HIGH (ref 11.5–15.5)
WBC: 18.3 10*3/uL — ABNORMAL HIGH (ref 4.0–10.5)
nRBC: 3.1 % — ABNORMAL HIGH (ref 0.0–0.2)

## 2019-06-20 LAB — PROTIME-INR
INR: 1.3 — ABNORMAL HIGH (ref 0.8–1.2)
Prothrombin Time: 16.1 seconds — ABNORMAL HIGH (ref 11.4–15.2)

## 2019-06-20 LAB — MAGNESIUM: Magnesium: 1.8 mg/dL (ref 1.7–2.4)

## 2019-06-20 MED ORDER — PENICILLIN G POTASSIUM 20000000 UNITS IJ SOLR
12.0000 10*6.[IU] | Freq: Two times a day (BID) | INTRAVENOUS | Status: DC
  Administered 2019-06-21 – 2019-06-23 (×5): 12 10*6.[IU] via INTRAVENOUS
  Filled 2019-06-20 (×8): qty 12

## 2019-06-20 NOTE — Progress Notes (Signed)
PHARMACY CONSULT NOTE FOR:  OUTPATIENT  PARENTERAL ANTIBIOTIC THERAPY (OPAT)  Indication: Group C Strep bacteremia/endocarditis  Regimen: Ceftriaxone 2 gm every 24 hours  End date: 07/15/19  IV antibiotic discharge orders are pended. To discharging provider:  please sign these orders via discharge navigator,  Select New Orders & click on the button choice - Manage This Unsigned Work.     Thank you for allowing pharmacy to be a part of this patient's care.  Sharin Mons, PharmD, BCPS, BCIDP Infectious Diseases Clinical Pharmacist Phone: [phone] 06/20/2019, 11:02 AM

## 2019-06-20 NOTE — Progress Notes (Signed)
      INFECTIOUS DISEASE ATTENDING ADDENDUM:   Date: 06/20/2019  Patient name: Theodore Brown  Medical record number: [id number]  Date of birth: [birth year]   Diagnosis: Bacteremia with Group G streptococcus  Culture Result:  Group G streptococcus  No Known Allergies  OPAT Orders Discharge antibiotics:   Ceftriaxone 2 grams daily    Duration:  4 weeks  End Date:  07/15/2019  [facility] Care Per Protocol:  Labs BI- weekly while on IV antibiotics:  Labs  weekly while on IV antibiotics: _x_ CBC with differential _x CMP w GFR/CMP   x__ Please pull PIC at completion of IV antibiotics __ Please leave PIC in place until doctor has seen patient or been notified  Fax weekly labs to [phone]  Clinic Follow Up Appt:   [name] has an appointment on 08/01/2019 at 945 AM  [facility] is located in the [facility] at  [address] in [place].  [address], which is located to the left of the elevators.  Phone: [phone]  Fax: [phone]  [email or web address]  He should arrive 15 minutes prior to his appt.     Acey Lav 06/20/2019, 1:43 PM

## 2019-06-20 NOTE — Progress Notes (Addendum)
Physical Therapy Treatment Patient Details Name: Theodore Brown MRN: [id number] DOB: [birth year] Today's Date: 06/20/2019    History of Present Illness 54 yo admitted with neck pain and paraesthesias after 2 recent falls. Pt with metabolic encephalopathy and cervical facet fx. PMHx: ETOH abuse, arthritis, cirrhosis    PT Comments    Pt performed gt training and functional mobility with poor balance and need for increased assistance this session.  He reports dizziness and presents with multiple LOB.  Based on his presentation will update recommendations to SNF placement as he is home alone and does not have power or gas in his home.  SNF placement would allow him to improve strength and function before returning home.     Follow Up Recommendations  SNF(if he decline SNF he will require charity HHPT as he is uninsured.)     Equipment Recommendations  Other (comment)(4 wheeled [employer] with a seat.)    Recommendations for Other Services       Precautions / Restrictions Precautions Precautions: Cervical;Fall Precaution Booklet Issued: Yes (comment) Required Braces or Orthoses: Cervical Brace Cervical Brace: Hard collar;At all times Restrictions Weight Bearing Restrictions: No    Mobility  Bed Mobility Overal bed mobility: Needs Assistance Bed Mobility: Rolling;Sidelying to Sit;Sit to Sidelying Rolling: Min assist Sidelying to sit: Min assist     Sit to sidelying: Mod assist General bed mobility comments: minA to progress trunk upright, mod assistance to return back to bed.  Transfers Overall transfer level: Needs assistance Equipment used: None Transfers: Sit to/from Stand Sit to Stand: Supervision         General transfer comment: To return to seated position he required min guard as he reports feeling dizzy.  No assistance to rise into standing.  Ambulation/Gait Ambulation/Gait assistance: Min assist;Mod assist Gait Distance (Feet): 100 Feet(X2  seated  rest break after feeling dizzy and x 1 instance of buckling with LOB to the R.)         General Gait Details: LOB noted multiple times with staggering and assistance to correct balance he did report dizziness but BP remains appropriate.   Stairs             Wheelchair Mobility    Modified Rankin (Stroke Patients Only)       Balance                                            Cognition Arousal/Alertness: Awake/alert Behavior During Therapy: WFL for tasks assessed/performed Overall Cognitive Status: Within Functional Limits for tasks assessed                                 General Comments: pt needing cues to maintain chin on craddle of collar      Exercises      General Comments General comments (skin integrity, edema, etc.): HR 110 bpm, BP 129/71 sitting and 126/69 in standing.  SPO2 95% with DOE.      Pertinent Vitals/Pain Pain Assessment: Faces Faces Pain Scale: Hurts little more Pain Location: pt declined pain but grimacing with movement of arms; also reports pain in Back and Neck. Pain Descriptors / Indicators: Grimacing;Guarding Pain Intervention(s): Monitored during session;Repositioned    Home Living  Prior Function            PT Goals (current goals can now be found in the care plan section) Acute Rehab PT Goals Patient Stated Goal: return home and get medicaid started Potential to Achieve Goals: Fair Progress towards PT goals: Progressing toward goals    Frequency    Min 3X/week      PT Plan Discharge plan needs to be updated    Co-evaluation              AM-PAC PT "6 Clicks" Mobility   Outcome Measure  Help needed turning from your back to your side while in a flat bed without using bedrails?: A Little Help needed moving from lying on your back to sitting on the side of a flat bed without using bedrails?: A Little Help needed moving to and from a bed to a chair  (including a wheelchair)?: A Little Help needed standing up from a chair using your arms (e.g., wheelchair or bedside chair)?: A Little Help needed to walk in hospital room?: A Little Help needed climbing 3-5 steps with a railing? : A Little 6 Click Score: 18    End of Session Equipment Utilized During Treatment: Gait belt;Cervical collar Activity Tolerance: Patient tolerated treatment well Patient left: in bed;with call bell/phone within reach;with bed alarm set Nurse Communication: Mobility status;Precautions PT Visit Diagnosis: Other abnormalities of gait and mobility (R26.89)     Time: 7902-4097 PT Time Calculation (min) (ACUTE ONLY): 30 min  Charges:  $Gait Training: 8-22 mins $Therapeutic Activity: 8-22 mins                     [name] , PTA Acute Rehabilitation Services Pager [phone] Office [phone]     Talina Pleitez Eli Hose 06/20/2019, 1:50 PM

## 2019-06-20 NOTE — Progress Notes (Signed)
Subjective: No new complaints   Antibiotics:  Anti-infectives (From admission, onward)   Start     Dose/Rate Route Frequency Ordered Stop   06/21/19 0800  penicillin G potassium 12 Million Units in dextrose 5 % 500 mL continuous infusion     12 Million Units 41.7 mL/hr over 12 Hours Intravenous Every 12 hours 06/20/19 0852     06/19/19 1800  vancomycin (VANCOREADY) IVPB 1250 mg/250 mL  Status:  Discontinued     1,250 mg 166.7 mL/hr over 90 Minutes Intravenous Every 12 hours 06/19/19 1722 06/20/19 0852   06/18/19 0600  vancomycin (VANCOREADY) IVPB 750 mg/150 mL  Status:  Discontinued     750 mg 150 mL/hr over 60 Minutes Intravenous Every 12 hours 06/17/19 1615 06/19/19 1722   06/17/19 1630  vancomycin (VANCOREADY) IVPB 1250 mg/250 mL     1,250 mg 166.7 mL/hr over 90 Minutes Intravenous  Once 06/17/19 1615 06/17/19 1837   06/17/19 0800  cefTRIAXone (ROCEPHIN) 2 g in sodium chloride 0.9 % 100 mL IVPB  Status:  Discontinued     2 g 200 mL/hr over 30 Minutes Intravenous Every 24 hours 06/17/19 0639 06/20/19 0852      Medications: Scheduled Meds: . ferrous sulfate  325 mg Oral BID WC  . folic acid  5 mg Oral Daily  . multivitamin with minerals  1 tablet Oral Daily  . thiamine  100 mg Oral Daily   Or  . thiamine  100 mg Intravenous Daily   Continuous Infusions: . [START ON 06/21/2019] penicillin g continuous IV infusion     PRN Meds:.acetaminophen, cyclobenzaprine, fentaNYL (SUBLIMAZE) injection, iohexol, oxyCODONE    Objective: Weight change:   Intake/Output Summary (Last 24 hours) at 06/20/2019 1027 Last data filed at 06/20/2019 0300 Gross per 24 hour  Intake 984.2 ml  Output 550 ml  Net 434.2 ml   Blood pressure (!) 162/80, pulse 95, temperature 99.1 F (37.3 C), temperature source Oral, resp. rate 18, height 5\' 7"  (1.702 m), weight 60.6 kg, SpO2 98 %. Temp:  [98.1 F (36.7 C)-100.1 F (37.8 C)] 99.1 F (37.3 C) (02/15 0838) Pulse Rate:  [90-103] 95  (02/15 0838) Resp:  [18-20] 18 (02/15 0838) BP: (129-162)/(70-80) 162/80 (02/15 0838) SpO2:  [97 %-98 %] 98 % (02/15 0838)  Physical Exam: General: Alert and awake, oriented x3, not in any acute distress.w C collar, poor dention HEENT: anicteric sclera, EOMI CVS regular rate, normal no mgr Chest: , no wheezing, no respiratory distress, fairly clear anteriorly Abdomen: soft non-distended,  Extremities: no edema or deformity noted bilaterally Skin: multiple excoriated lesions that he says are due to "bed bug bites" Neuro: nonfocal  CBC:    BMET Recent Labs    06/19/19 0416 06/20/19 0455  NA 130* 130*  K 3.2* 4.0  CL 99 101  CO2 21* 20*  GLUCOSE 93 83  BUN 9 7  CREATININE 0.85 0.83  CALCIUM 7.9* 8.2*     Liver Panel  Recent Labs    06/18/19 0739 06/18/19 0739 06/19/19 0416 06/20/19 0455  PROT 6.0*  --   --   --   ALBUMIN 1.7*  1.7*   < > 1.6* 1.6*  AST 97*  --   --   --   ALT 40  --   --   --   ALKPHOS 106  --   --   --   BILITOT 2.8*  --   --   --  BILIDIR 1.7*  --   --   --   IBILI 1.1*  --   --   --    < > = values in this interval not displayed.       Sedimentation Rate No results for input(s): ESRSEDRATE in the last 72 hours. C-Reactive Protein No results for input(s): CRP in the last 72 hours.  Micro Results: Recent Results (from the past 720 hour(s))  Respiratory Panel by RT PCR (Flu A&B, Covid) - Nasopharyngeal Swab     Status: None   Collection Time: 06/16/19  2:07 PM   Specimen: Nasopharyngeal Swab  Result Value Ref Range Status   SARS Coronavirus 2 by RT PCR NEGATIVE NEGATIVE Final    Comment: (NOTE) SARS-CoV-2 target nucleic acids are NOT DETECTED. The SARS-CoV-2 RNA is generally detectable in upper respiratoy specimens during the acute phase of infection. The lowest concentration of SARS-CoV-2 viral copies this assay can detect is 131 copies/mL. A negative result does not preclude SARS-Cov-2 infection and should not be used as the  sole basis for treatment or other patient management decisions. A negative result may occur with  improper specimen collection/handling, submission of specimen other than nasopharyngeal swab, presence of viral mutation(s) within the areas targeted by this assay, and inadequate number of viral copies (<131 copies/mL). A negative result must be combined with clinical observations, patient history, and epidemiological information. The expected result is Negative. Fact Sheet for Patients:  https://www.moore.com/ Fact Sheet for Healthcare Providers:  https://www.young.biz/ This test is not yet ap proved or cleared by the [place] FDA and  has been authorized for detection and/or diagnosis of SARS-CoV-2 by FDA under an Emergency Use Authorization (EUA). This EUA will remain  in effect (meaning this test can be used) for the duration of the COVID-19 declaration under Section 564(b)(1) of the Act, 21 U.S.C. section 360bbb-3(b)(1), unless the authorization is terminated or revoked sooner.    Influenza A by PCR NEGATIVE NEGATIVE Final   Influenza B by PCR NEGATIVE NEGATIVE Final    Comment: (NOTE) The Xpert Xpress SARS-CoV-2/FLU/RSV assay is intended as an aid in  the diagnosis of influenza from Nasopharyngeal swab specimens and  should not be used as a sole basis for treatment. Nasal washings and  aspirates are unacceptable for Xpert Xpress SARS-CoV-2/FLU/RSV  testing. Fact Sheet for Patients: https://www.moore.com/ Fact Sheet for Healthcare Providers: https://www.young.biz/ This test is not yet approved or cleared by the [place] FDA and  has been authorized for detection and/or diagnosis of SARS-CoV-2 by  FDA under an Emergency Use Authorization (EUA). This EUA will remain  in effect (meaning this test can be used) for the duration of the  Covid-19 declaration under Section 564(b)(1) of the Act, 21    U.S.C. section 360bbb-3(b)(1), unless the authorization is  terminated or revoked. Performed at [facility], [address], [place], [place] [address]   Culture, blood (routine x 2)     Status: Abnormal   Collection Time: 06/16/19  3:50 PM   Specimen: BLOOD RIGHT ARM  Result Value Ref Range Status   Specimen Description BLOOD RIGHT ARM  Final   Special Requests   Final    BOTTLES DRAWN AEROBIC AND ANAEROBIC Blood Culture adequate volume   Culture  Setup Time   Final    GRAM POSITIVE COCCI IN CHAINS IN BOTH AEROBIC AND ANAEROBIC BOTTLES CRITICAL RESULT CALLED TO, READ BACK BY AND VERIFIED WITH: Lieutenant Diego 1950 06/17/2019 T. TYSOR Performed at [facility], [address], [place], [place] [address]    Culture STREPTOCOCCUS GROUP C (A)  Final   Report Status 06/20/2019 FINAL  Final   Organism ID, Bacteria STREPTOCOCCUS GROUP C  Final      Susceptibility   Streptococcus group c - MIC*    CLINDAMYCIN >=1 RESISTANT Resistant     AMPICILLIN <=0.25 SENSITIVE Sensitive     ERYTHROMYCIN >=8 RESISTANT Resistant     VANCOMYCIN 0.25 SENSITIVE Sensitive     CEFTRIAXONE <=0.12 SENSITIVE Sensitive     LEVOFLOXACIN <=0.25 SENSITIVE Sensitive     PENICILLIN Value in next row Sensitive      SENSITIVE0.06    * STREPTOCOCCUS GROUP C  Culture, blood (routine x 2)     Status: Abnormal   Collection Time: 06/16/19  3:57 PM   Specimen: BLOOD RIGHT ARM  Result Value Ref Range Status   Specimen Description BLOOD RIGHT ARM  Final   Special Requests   Final    BOTTLES DRAWN AEROBIC AND ANAEROBIC Blood Culture adequate volume   Culture  Setup Time   Final    GRAM POSITIVE COCCI IN CHAINS IN BOTH AEROBIC AND ANAEROBIC BOTTLES CRITICAL RESULT CALLED TO, READ BACK BY AND VERIFIED WITH: G. ABBOTT,PHARMD [id number] 06/17/2019 T. TYSOR    Culture (A)  Final    STREPTOCOCCUS GROUP C SUSCEPTIBILITIES PERFORMED ON PREVIOUS CULTURE WITHIN THE LAST 5 DAYS. Performed at [facility],  [address] [place], [place] [address]    Report Status 06/20/2019 FINAL  Final  Blood Culture ID Panel (Reflexed)     Status: Abnormal   Collection Time: 06/16/19  3:57 PM  Result Value Ref Range Status   Enterococcus species NOT DETECTED NOT DETECTED Final   Listeria monocytogenes NOT DETECTED NOT DETECTED Final   Staphylococcus species NOT DETECTED NOT DETECTED Final   Staphylococcus aureus (BCID) NOT DETECTED NOT DETECTED Final   Streptococcus species DETECTED (A) NOT DETECTED Final    Comment: Not Enterococcus species, Streptococcus agalactiae, Streptococcus pyogenes, or Streptococcus pneumoniae. CRITICAL RESULT CALLED TO, READ BACK BY AND VERIFIED WITH: G. ABBOTT,PHARMD [id number] 06/17/2019 T. TYSOR    Streptococcus agalactiae NOT DETECTED NOT DETECTED Final   Streptococcus pneumoniae NOT DETECTED NOT DETECTED Final   Streptococcus pyogenes NOT DETECTED NOT DETECTED Final   Acinetobacter baumannii NOT DETECTED NOT DETECTED Final   Enterobacteriaceae species NOT DETECTED NOT DETECTED Final   Enterobacter cloacae complex NOT DETECTED NOT DETECTED Final   Escherichia coli NOT DETECTED NOT DETECTED Final   Klebsiella oxytoca NOT DETECTED NOT DETECTED Final   Klebsiella pneumoniae NOT DETECTED NOT DETECTED Final   Proteus species NOT DETECTED NOT DETECTED Final   Serratia marcescens NOT DETECTED NOT DETECTED Final   Haemophilus influenzae NOT DETECTED NOT DETECTED Final   Neisseria meningitidis NOT DETECTED NOT DETECTED Final   Pseudomonas aeruginosa NOT DETECTED NOT DETECTED Final   Candida albicans NOT DETECTED NOT DETECTED Final   Candida glabrata NOT DETECTED NOT DETECTED Final   Candida krusei NOT DETECTED NOT DETECTED Final   Candida parapsilosis NOT DETECTED NOT DETECTED Final   Candida tropicalis NOT DETECTED NOT DETECTED Final    Comment: Performed at [facility], [address], [place], [place] [address]  SARS CORONAVIRUS 2 (TAT 6-24 HRS) Nasopharyngeal Nasopharyngeal  Swab     Status: None   Collection Time: 06/16/19  7:13 PM   Specimen: Nasopharyngeal Swab  Result Value Ref Range Status   SARS Coronavirus 2 NEGATIVE NEGATIVE  Final    Comment: (NOTE) SARS-CoV-2 target nucleic acids are NOT DETECTED. The SARS-CoV-2 RNA is generally detectable in upper and lower respiratory specimens during the acute phase of infection. Negative results do not preclude SARS-CoV-2 infection, do not rule out co-infections with other pathogens, and should not be used as the sole basis for treatment or other patient management decisions. Negative results must be combined with clinical observations, patient history, and epidemiological information. The expected result is Negative. Fact Sheet for Patients: HairSlick.no Fact Sheet for Healthcare Providers: quierodirigir.com This test is not yet approved or cleared by the [place] FDA and  has been authorized for detection and/or diagnosis of SARS-CoV-2 by FDA under an Emergency Use Authorization (EUA). This EUA will remain  in effect (meaning this test can be used) for the duration of the COVID-19 declaration under Section 56 4(b)(1) of the Act, 21 U.S.C. section 360bbb-3(b)(1), unless the authorization is terminated or revoked sooner. Performed at [facility], [address], [place], [place] [address]   Culture, blood (routine x 2)     Status: None (Preliminary result)   Collection Time: 06/18/19  8:19 AM   Specimen: BLOOD LEFT FOREARM  Result Value Ref Range Status   Specimen Description BLOOD LEFT FOREARM  Final   Special Requests   Final    BOTTLES DRAWN AEROBIC ONLY Blood Culture adequate volume   Culture   Final    NO GROWTH 2 DAYS Performed at [facility], [address], [place], [place] [address]    Report Status PENDING  Incomplete  Culture, blood (routine x 2)     Status: None (Preliminary result)   Collection Time: 06/18/19  8:19  AM   Specimen: BLOOD  Result Value Ref Range Status   Specimen Description BLOOD LEFT ANTECUBITAL  Final   Special Requests   Final    BOTTLES DRAWN AEROBIC ONLY Blood Culture adequate volume   Culture   Final    NO GROWTH 2 DAYS Performed at [facility], [address], [place], [place] [address]    Report Status PENDING  Incomplete    Studies/Results: No results found.    Assessment/Plan:  INTERVAL HISTORY: Group G strep growing from blood and S to PCN   Principal Problem:   Bacteremia due to Streptococcus Active Problems:   SIRS (systemic inflammatory response syndrome) (HCC)   Alcoholic liver disease (HCC)   Injury of cervical spine (HCC)    Theodore Brown is a 54 y.o. male with history of alcoholism admitted with tachycardia and hypotension after falls found to have extensive neck fractures and now group G streptococcal bacteremia.  He has poor dentition and I was initially suspecting that he was having a streptococcal infection that originated in the oropharynx but group G strep would suggest the skin.  Indeed he does have multiple excoriated lesions on the skin which could have been a portal for group G strep.  #1 Group G streptococcal bacteremia: We are not going to able to get a TEE on him to rule out or rule in endocarditis.  And said I think I would proceed with giving him at least 4 weeks of a beta-lactam.  In cases of actual endocarditis addition of an aminoglycoside for 2 weeks is considered.  However in this patient I do not think trying to give an aminoglycoside to him as an outpatient is a good idea.  For now we have him on high-dose IV penicillin as an  inpatient.  If he were to discharge home as was indicated in the notes that I am seeing I will change him to 2 g of ceftriaxone daily and complete a 4-week course     LOS: 4 days   Acey Lav 06/20/2019, 10:27 AM

## 2019-06-20 NOTE — Progress Notes (Signed)
Theodore Brown, Theodore   PCP: Loletta Specter, Theodore  Patient is from: Home.  DOA: 06/16/2019 LOS: 4  Brief Narrative / Interim history: 54 year old male with history of alcohol Brown, Theodore Brown, Theodore Brown fall 2 days ago.  Reportedly fell backward and hit his head without loss of consciousness.  Tred to manage his neck pain at home before he presented to ED.  In ED, hypotensive to 86/58 and tachycardic to 122.  But improved with IV fluid.  Afebrile.  Normal saturation on room air.  Hgb 7 (baseline 10.2).  MCV 66.  WBC 23.2. Na 124.  K3.4.  Cr 1.34 (b/l 0.92).  EKG LVH with bilateral atrial enlargement.  CTA head and neck without acute finding but degenerative disc and facet disease.  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  MRI cervical spine ordered.  Patient was admitted for SIRS, acute encephalopathy, volume depletion, cervical radiculopathy and electrolyte derangement.  The next morning, blood cultures grew GPC in chains.  ID consulted.  Started on IV ceftriaxone and vancomycin.  Repeat blood culture ordered to be drawn at 24 hours Brown antibiotic initiation.  Echocardiogram with normal EF and no vegetation.  MRI cervical spine revealed acute cervical spine injury with evidence of bilateral C2-C3 facet disruption, C5-C6 disc space disruption, blood or fluid within the spinal canal from C2-C3 to C6-C7 contributing to spinal stenosis with cord mass-effect but no definite spinal cord contusion or edema and bulky prevertebral blood or fluid suspicious for anterior ligamentous injury.  Neurosurgery consulted and recommended Aspen collar, conservative management and outpatient follow-up in 6 weeks for flexion-extension C-spine x-rays.     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 -Still with pain but improving. Continue pain control-Tylenol, Flexeril, oxycodone and fentanyl -SCD for VTE prophylaxis in the setting of bleeding and thrombocytopenia -PT/OT eval. interestingly, OT recommends SNF but PT recommends outpatient PT.  Severe sepsis due to Streptococcus bacteremia: Sepsis physiology improving.  Likely odontogenic source.  Denies IVDU.  TTE without significant finding or vegetation, unable to have TEE due to cervical fracture. -Appreciate guidance by ID -Antibiotics were switched to high-dose ampicillin today by ID.  They recommend 2 g IV Rocephin outpatient for total of 4 weeks if he was to discharge home. -Follow cultures speciation and sensitivity  Alcoholic Theodore Brown/varices/splenomegaly Alcohol use disorder-admits drinking 3-4 beers a day. Elevated Theodore enzyme/hyperbilirubinemia-likely due to alcohol.  CK, acute hepatitis panel and HIV negative. Coagulopathy: INR 1.4.  Likely due to Theodore Brown. -Encouraged cessation -CIWA with as needed Ativan. No signs of withdrawal currently. -Continue multivitamins and thiamine Brown banana bag  Acute metabolic encephalopathy: Likely due to the above.  Resolved. -Treat treatable causes  Acute kidney injury: Likely due to sepsis and dehydration.  Resolved.  Hyponatremia/hypophosphatemia: Likely due to alcohol Brown. -Replenish and recheck  Hypokalemia/hypomagnesemia-resolved.  Iron deficiency anemia: Baseline Hgb 8-9> 7.0 (admit) > 7.9>7.7>7.5 >7.6 >7.0>7.1> 7.5> 7.4.  Likely due to alcohol. LDH elevated. Haptoglobin pending. So far work-up indicates iron deficiency anemia. Has microcytosis as well. Continue oral ferrous sulfate. Monitor CBC and transfuse if  hemoglobin drops less than 7.  Leukocytosis/bandemia: Likely due to bacteremia.  Improving. -Continue monitoring  Thrombocytopenia: Likely due to  alcohol.  About baseline. No signs of bleeding. -Continue monitoring.   DVT prophylaxis: SCD due to epidural bleed and thrombocytopenia Code Status: Full code Family Communication: Discussed in length with patient. He is alert, oriented and competent. No family present.  Discharge barrier: Still dizzy with PT.  PT OT recommends home health or SNF.  Per case manager, he has no insurance so cannot go to SNF.  Will need to be better than this in order to go home with home health and will also need IV antibiotics for 4 weeks per ID. Patient is from: Home Final disposition: Home with home health.  Consultants: Neurosurgery    Subjective: Seen and examined.  Continues to complain of worsening of neck pain and shoulder pain.  No other complaint.  Objective: Vitals:   02/Brown/21 1936 06/20/19 0108 06/20/19 0451 06/20/19 0838  BP: (!) 159/75 (!) 162/73 (!) 149/77 (!) 162/80  Pulse: 93 (!) 103 94 95  Resp: 19 20 20 18   Temp: 99 F (37.2 C) 99.5 F (37.5 C) 98.7 F (37.1 C) 99.1 F (37.3 C)  TempSrc: Oral Oral Oral Oral  SpO2: 98% 97% 98% 98%  Weight:      Height:        Intake/Output Summary (Last 24 hours) at 06/20/2019 1156 Last data filed at 06/20/2019 0300 Gross per 24 hour  Intake 984.2 ml  Output 550 ml  Net 434.2 ml   Filed Weights   06/16/19 1228 06/17/19 0030  Weight: 63.5 kg 60.6 kg    Examination:   General exam: Appears calm and comfortable, Aspen collar in place Respiratory system: Diminished breath sounds due to poor inspiratory effort.  Cardiovascular system: S1 & S2 heard, RRR. No JVD, murmurs, rubs, gallops or clicks. No pedal edema. Gastrointestinal system: Abdomen is nondistended, soft and nontender. No organomegaly or masses felt. Normal bowel sounds heard. Central nervous system: Alert and oriented. No focal  neurological deficits. Extremities: Symmetric 5 x 5 power. Skin: No rashes, lesions or ulcers.  Psychiatry: Judgement and insight appear normal. Mood & affect appropriate.    Procedures:  None   Microbiology summarized: [id number] negative. Influenza PCR negative. Blood cultures GPC in chains in 4 out of 4 bottles.  Sch Meds:  Scheduled Meds: . ferrous sulfate  325 mg Oral BID WC  . folic acid  5 mg Oral Daily  . multivitamin with minerals  1 tablet Oral Daily  . thiamine  100 mg Oral Daily   Or  . thiamine  100 mg Intravenous Daily   Continuous Infusions: . [START ON 06/21/2019] penicillin g continuous IV infusion     PRN Meds:.acetaminophen, cyclobenzaprine, fentaNYL (SUBLIMAZE) injection, iohexol, oxyCODONE  Antimicrobials: Anti-infectives (From admission, onward)   Start     Dose/Rate Route Frequency Ordered Stop   06/21/19 0800  penicillin G potassium 12 Million Units in dextrose 5 % 500 mL continuous infusion     12 Million Units 41.7 mL/hr over 12 Hours Intravenous Every 12 hours 06/20/19 0852     02/Brown/21 1800  vancomycin (VANCOREADY) IVPB 1250 mg/250 mL  Status:  Discontinued     1,250 mg 166.7 mL/hr over 90 Minutes Intravenous Every 12 hours 02/Brown/21 1722 06/20/19 0852   06/18/19 0600  vancomycin (VANCOREADY) IVPB 750 mg/150 mL  Status:  Discontinued     750 mg 150 mL/hr over 60 Minutes Intravenous Every 12 hours 06/17/19 1615 02/Brown/21 1722   06/17/19 1630  vancomycin (VANCOREADY) IVPB 1250 mg/250 mL     1,250 mg  166.7 mL/hr over 90 Minutes Intravenous  Once 06/17/19 1615 06/17/19 1837   06/17/19 0800  cefTRIAXone (ROCEPHIN) 2 g in sodium chloride 0.9 % 100 mL IVPB  Status:  Discontinued     2 g 200 mL/hr over 30 Minutes Intravenous Every 24 hours 06/17/19 0639 06/20/19 0852       I have personally reviewed the following labs and images: CBC: Recent Labs  Lab 06/16/19 2350 06/16/19 2350 06/17/19 0719 06/17/19 1501 06/18/19 0739 02/Brown/21 0416  02/Brown/21 1203 02/Brown/21 2133 06/20/19 0455  WBC 17.7*  --  16.4*  --  17.1* 17.7*  --   --  18.3*  HGB 7.9*   < > 7.7*   < > 7.6* 7.0* 7.1* 7.5* 7.4*  HCT 25.5*  26.2*   < > 24.5*   < > 25.1* 21.9* 23.0* 23.9* 24.6*  MCV 67.6*  --  66.6*  --  68.2* 66.2*  --   --  68.9*  PLT 51*  --  50*  --  56* 69*  --   --  100*   < > = values in this interval not displayed.   BMP &GFR Recent Labs  Lab 06/16/19 2350 06/17/19 0719 06/17/19 1046 06/18/19 0739 02/Brown/21 0416 06/20/19 0455  NA 125* 128*  --  127* 130* 130*  K 3.3* 3.4*  --  4.0 3.2* 4.0  CL 93* 97*  --  97* 99 101  CO2 22 23  --  21* 21* 20*  GLUCOSE 82 89  --  94 93 83  BUN 22* 20  --  10 9 7   CREATININE 1.18 0.99  --  0.80 0.85 0.83  CALCIUM 7.9* 7.7*  --  7.8* 7.9* 8.2*  MG 2.2  --  2.0 1.9 1.9 1.8  PHOS 1.7*  --  <1.0* 1.7*  1.6* 2.2* 2.4*   Estimated Creatinine Clearance: 89.2 mL/min (by C-G formula based on SCr of 0.83 mg/dL). Theodore & Pancreas: Recent Labs  Lab 06/16/19 1234 06/16/19 1234 06/16/19 2350 06/17/19 0719 06/18/19 0739 02/Brown/21 0416 06/20/19 0455  AST 104*  --  96* 88* 97*  --   --   ALT 45*  --  40 41 40  --   --   ALKPHOS 85  --  92 85 106  --   --   BILITOT 2.4*  --  3.1* 2.6* 2.8*  --   --   PROT 6.6  --  6.1* 5.9* 6.0*  --   --   ALBUMIN 2.1*   < > 2.0* 1.8* 1.7*  1.7* 1.6* 1.6*   < > = values in this interval not displayed.   No results for input(s): LIPASE, AMYLASE in the last 168 hours. Recent Labs  Lab 06/16/19 1344 06/17/19 2119  AMMONIA 30 33   Diabetic: No results for input(s): HGBA1C in the last 72 hours. Recent Labs  Lab 06/16/19 1350 06/17/19 0605  GLUCAP 96 89   Cardiac Enzymes: Recent Labs  Lab 06/18/19 0739  CKTOTAL 38*   No results for input(s): PROBNP in the last 8760 hours. Coagulation Profile: Recent Labs  Lab 06/16/19 1344 06/17/19 1046 06/18/19 0739 02/Brown/21 0416 06/20/19 0455  INR 1.5* 1.4* 1.4* 1.4* 1.3*   Thyroid Function Tests: No results  for input(s): TSH, T4TOTAL, FREET4, T3FREE, THYROIDAB in the last 72 hours. Lipid Profile: No results for input(s): CHOL, HDL, LDLCALC, TRIG, CHOLHDL, LDLDIRECT in the last 72 hours. Anemia Panel: No results for input(s): VITAMINB12, FOLATE, FERRITIN, TIBC, IRON,  RETICCTPCT in the last 72 hours. Urine analysis:    Component Value Date/Time   COLORURINE AMBER (A) 06/16/2019 1317   APPEARANCEUR CLEAR 06/16/2019 1317   LABSPEC 1.019 06/16/2019 1317   PHURINE 5.0 06/16/2019 1317   GLUCOSEU NEGATIVE 06/16/2019 1317   HGBUR NEGATIVE 06/16/2019 1317   BILIRUBINUR NEGATIVE 06/16/2019 1317   BILIRUBINUR small 01/28/2017 1422   KETONESUR NEGATIVE 06/16/2019 1317   PROTEINUR NEGATIVE 06/16/2019 1317   UROBILINOGEN >=8.0 (A) 01/28/2017 1422   UROBILINOGEN 0.2 01/05/2013 0716   NITRITE NEGATIVE 06/16/2019 1317   LEUKOCYTESUR NEGATIVE 06/16/2019 1317   Sepsis Labs: Invalid input(s): PROCALCITONIN, LACTICIDVEN  Microbiology: Recent Results (from the past 240 hour(s))  Respiratory Panel by RT PCR (Flu A&B, Covid) - Nasopharyngeal Swab     Status: None   Collection Time: 06/16/19  2:07 PM   Specimen: Nasopharyngeal Swab  Result Value Ref Range Status   SARS Coronavirus 2 by RT PCR NEGATIVE NEGATIVE Final    Comment: (NOTE) SARS-CoV-2 target nucleic acids are NOT DETECTED. The SARS-CoV-2 RNA is generally detectable in upper respiratoy specimens during the acute phase of infection. The lowest concentration of SARS-CoV-2 viral copies this assay can detect is 131 copies/mL. A negative result does not preclude SARS-Cov-2 infection and should not be used as the sole basis for treatment or other patient management decisions. A negative result may occur with  improper specimen collection/handling, submission of specimen other than nasopharyngeal swab, presence of viral mutation(s) within the areas targeted by this assay, and inadequate number of viral copies (<131 copies/mL). A negative result  must be combined with clinical observations, patient history, and epidemiological information. The expected result is Negative. Fact Sheet for Patients:  https://www.moore.com/ Fact Sheet for Healthcare Providers:  https://www.young.biz/ This test is not yet ap proved or cleared by the [place] FDA and  has been authorized for detection and/or diagnosis of SARS-CoV-2 by FDA under an Emergency Use Authorization (EUA). This EUA will remain  in effect (meaning this test can be used) for the duration of the COVID-19 declaration under Section 564(b)(1) of the Act, 21 U.S.C. section 360bbb-3(b)(1), unless the authorization is terminated or revoked sooner.    Influenza A by PCR NEGATIVE NEGATIVE Final   Influenza B by PCR NEGATIVE NEGATIVE Final    Comment: (NOTE) The Xpert Xpress SARS-CoV-2/FLU/RSV assay is intended as an aid in  the diagnosis of influenza from Nasopharyngeal swab specimens and  should not be used as a sole basis for treatment. Nasal washings and  aspirates are unacceptable for Xpert Xpress SARS-CoV-2/FLU/RSV  testing. Fact Sheet for Patients: https://www.moore.com/ Fact Sheet for Healthcare Providers: https://www.young.biz/ This test is not yet approved or cleared by the [place] FDA and  has been authorized for detection and/or diagnosis of SARS-CoV-2 by  FDA under an Emergency Use Authorization (EUA). This EUA will remain  in effect (meaning this test can be used) for the duration of the  Covid-19 declaration under Section 564(b)(1) of the Act, 21  U.S.C. section 360bbb-3(b)(1), unless the authorization is  terminated or revoked. Performed at [facility], [address] [place], [place] [address]   Culture, blood (routine x 2)     Status: Abnormal   Collection Time: 06/16/19  3:50 PM   Specimen: BLOOD RIGHT ARM  Result Value Ref Range Status   Specimen Description BLOOD  RIGHT ARM  Final   Special Requests   Final    BOTTLES DRAWN AEROBIC AND ANAEROBIC Blood Culture adequate volume  Culture  Setup Time   Final    GRAM POSITIVE COCCI IN CHAINS IN BOTH AEROBIC AND ANAEROBIC BOTTLES CRITICAL RESULT CALLED TO, READ BACK BY AND VERIFIED WITH: Lieutenant Diego [id number] 06/17/2019 Girtha Hake Performed at [facility], [address] [place], [place] [address]    Culture STREPTOCOCCUS GROUP C (A)  Final   Report Status 06/20/2019 FINAL  Final   Organism ID, Bacteria STREPTOCOCCUS GROUP C  Final      Susceptibility   Streptococcus group c - MIC*    CLINDAMYCIN >=1 RESISTANT Resistant     AMPICILLIN <=0.25 SENSITIVE Sensitive     ERYTHROMYCIN >=8 RESISTANT Resistant     VANCOMYCIN 0.25 SENSITIVE Sensitive     CEFTRIAXONE <=0.12 SENSITIVE Sensitive     LEVOFLOXACIN <=0.25 SENSITIVE Sensitive     PENICILLIN Value in next row Sensitive      SENSITIVE0.06    * STREPTOCOCCUS GROUP C  Culture, blood (routine x 2)     Status: Abnormal   Collection Time: 06/16/19  3:57 PM   Specimen: BLOOD RIGHT ARM  Result Value Ref Range Status   Specimen Description BLOOD RIGHT ARM  Final   Special Requests   Final    BOTTLES DRAWN AEROBIC AND ANAEROBIC Blood Culture adequate volume   Culture  Setup Time   Final    GRAM POSITIVE COCCI IN CHAINS IN BOTH AEROBIC AND ANAEROBIC BOTTLES CRITICAL RESULT CALLED TO, READ BACK BY AND VERIFIED WITH: G. ABBOTT,PHARMD [id number] 06/17/2019 T. TYSOR    Culture (A)  Final    STREPTOCOCCUS GROUP C SUSCEPTIBILITIES PERFORMED ON PREVIOUS CULTURE WITHIN THE LAST 5 DAYS. Performed at [facility], [address], [place], [place] [address]    Report Status 06/20/2019 FINAL  Final  Blood Culture ID Panel (Reflexed)     Status: Abnormal   Collection Time: 06/16/19  3:57 PM  Result Value Ref Range Status   Enterococcus species NOT DETECTED NOT DETECTED Final   Listeria monocytogenes NOT DETECTED NOT DETECTED Final   Staphylococcus species  NOT DETECTED NOT DETECTED Final   Staphylococcus aureus (BCID) NOT DETECTED NOT DETECTED Final   Streptococcus species DETECTED (A) NOT DETECTED Final    Comment: Not Enterococcus species, Streptococcus agalactiae, Streptococcus pyogenes, or Streptococcus pneumoniae. CRITICAL RESULT CALLED TO, READ BACK BY AND VERIFIED WITH: G. ABBOTT,PHARMD [id number] 06/17/2019 T. TYSOR    Streptococcus agalactiae NOT DETECTED NOT DETECTED Final   Streptococcus pneumoniae NOT DETECTED NOT DETECTED Final   Streptococcus pyogenes NOT DETECTED NOT DETECTED Final   Acinetobacter baumannii NOT DETECTED NOT DETECTED Final   Enterobacteriaceae species NOT DETECTED NOT DETECTED Final   Enterobacter cloacae complex NOT DETECTED NOT DETECTED Final   Escherichia coli NOT DETECTED NOT DETECTED Final   Klebsiella oxytoca NOT DETECTED NOT DETECTED Final   Klebsiella pneumoniae NOT DETECTED NOT DETECTED Final   Proteus species NOT DETECTED NOT DETECTED Final   Serratia marcescens NOT DETECTED NOT DETECTED Final   Haemophilus influenzae NOT DETECTED NOT DETECTED Final   Neisseria meningitidis NOT DETECTED NOT DETECTED Final   Pseudomonas aeruginosa NOT DETECTED NOT DETECTED Final   Candida albicans NOT DETECTED NOT DETECTED Final   Candida glabrata NOT DETECTED NOT DETECTED Final   Candida krusei NOT DETECTED NOT DETECTED Final   Candida parapsilosis NOT DETECTED NOT DETECTED Final   Candida tropicalis NOT DETECTED NOT DETECTED Final    Comment: Performed at [facility], [address] [place], [place] [address]  SARS  CORONAVIRUS 2 (TAT 6-24 HRS) Nasopharyngeal Nasopharyngeal Swab     Status: None   Collection Time: 06/16/19  7:13 PM   Specimen: Nasopharyngeal Swab  Result Value Ref Range Status   SARS Coronavirus 2 NEGATIVE NEGATIVE Final    Comment: (NOTE) SARS-CoV-2 target nucleic acids are NOT DETECTED. The SARS-CoV-2 RNA is generally detectable in upper and lower respiratory specimens during the acute  phase of infection. Negative results do not preclude SARS-CoV-2 infection, do not rule out co-infections with other pathogens, and should not be used as the sole basis for treatment or other patient management decisions. Negative results must be combined with clinical observations, patient history, and epidemiological information. The expected result is Negative. Fact Sheet for Patients: HairSlick.no Fact Sheet for Healthcare Providers: quierodirigir.com This test is not yet approved or cleared by the [place] FDA and  has been authorized for detection and/or diagnosis of SARS-CoV-2 by FDA under an Emergency Use Authorization (EUA). This EUA will remain  in effect (meaning this test can be used) for the duration of the COVID-19 declaration under Section 56 4(b)(1) of the Act, 21 U.S.C. section 360bbb-3(b)(1), unless the authorization is terminated or revoked sooner. Performed at [facility], [address] [place], [place] [address]   Culture, blood (routine x 2)     Status: None (Preliminary result)   Collection Time: 06/18/19  8:19 AM   Specimen: BLOOD LEFT FOREARM  Result Value Ref Range Status   Specimen Description BLOOD LEFT FOREARM  Final   Special Requests   Final    BOTTLES DRAWN AEROBIC ONLY Blood Culture adequate volume   Culture   Final    NO GROWTH 2 DAYS Performed at [facility], [address], [place], [place] [address]    Report Status PENDING  Incomplete  Culture, blood (routine x 2)     Status: None (Preliminary result)   Collection Time: 06/18/19  8:19 AM   Specimen: BLOOD  Result Value Ref Range Status   Specimen Description BLOOD LEFT ANTECUBITAL  Final   Special Requests   Final    BOTTLES DRAWN AEROBIC ONLY Blood Culture adequate volume   Culture   Final    NO GROWTH 2 DAYS Performed at [facility], [address], [place], [place] [address]    Report Status PENDING   Incomplete    Radiology Studies: No results found.  Hughie Closs, MD [facility] If 7PM-7AM, please contact night-coverage [email or web address] 06/20/2019, 11:56 AM

## 2019-06-21 ENCOUNTER — Inpatient Hospital Stay: Payer: Self-pay

## 2019-06-21 LAB — MAGNESIUM: Magnesium: 1.7 mg/dL (ref 1.7–2.4)

## 2019-06-21 LAB — CBC
HCT: 24.5 % — ABNORMAL LOW (ref 39.0–52.0)
Hemoglobin: 7.5 g/dL — ABNORMAL LOW (ref 13.0–17.0)
MCH: 21.3 pg — ABNORMAL LOW (ref 26.0–34.0)
MCHC: 30.6 g/dL (ref 30.0–36.0)
MCV: 69.6 fL — ABNORMAL LOW (ref 80.0–100.0)
Platelets: 113 10*3/uL — ABNORMAL LOW (ref 150–400)
RBC: 3.52 MIL/uL — ABNORMAL LOW (ref 4.22–5.81)
RDW: 27.1 % — ABNORMAL HIGH (ref 11.5–15.5)
WBC: 16.1 10*3/uL — ABNORMAL HIGH (ref 4.0–10.5)
nRBC: 1.9 % — ABNORMAL HIGH (ref 0.0–0.2)

## 2019-06-21 LAB — RENAL FUNCTION PANEL
Albumin: 1.6 g/dL — ABNORMAL LOW (ref 3.5–5.0)
Anion gap: 8 (ref 5–15)
BUN: 7 mg/dL (ref 6–20)
CO2: 19 mmol/L — ABNORMAL LOW (ref 22–32)
Calcium: 8.2 mg/dL — ABNORMAL LOW (ref 8.9–10.3)
Chloride: 103 mmol/L (ref 98–111)
Creatinine, Ser: 0.88 mg/dL (ref 0.61–1.24)
GFR calc Af Amer: >60 mL/min (ref >60)
GFR calc non Af Amer: >60 mL/min (ref >60)
Glucose, Bld: 120 mg/dL — ABNORMAL HIGH (ref 70–99)
Phosphorus: 2.8 mg/dL (ref 2.5–4.6)
Potassium: 3.6 mmol/L (ref 3.5–5.1)
Sodium: 130 mmol/L — ABNORMAL LOW (ref 135–145)

## 2019-06-21 LAB — PROTIME-INR
INR: 1.4 — ABNORMAL HIGH (ref 0.8–1.2)
Prothrombin Time: 16.7 seconds — ABNORMAL HIGH (ref 11.4–15.2)

## 2019-06-21 NOTE — Progress Notes (Signed)
Pt became agitated, stated he wants to go home, he was redirected and was a little calm, mom called and she talked to him, pt stated that he was on 'a dream' but is okay now, alert X4, answered all questions right, will continue to monitor

## 2019-06-21 NOTE — TOC Progression Note (Addendum)
Transition of Care [facility]) - Progression Note    Patient Details  Name: Theodore Brown MRN: [id number] Date of Birth: [birth year]  Transition of Care [facility]) CM/SW Contact  Kermit Balo, RN Phone Number: 06/21/2019, 12:17 PM  Clinical Narrative:    Pt provided resources for getting assistance with his power and gas to get them turned back on. Pt to call the different agencies to see if he can get help.  TOC following.   Expected Discharge Plan: Skilled Nursing Facility Barriers to Discharge: Continued Medical Work up, Inadequate or no insurance, Unsafe home situation  Expected Discharge Plan and Services Expected Discharge Plan: Skilled Nursing Facility In-house Referral: Clinical Social Work Discharge Planning Services: CM Consult Post Acute Care Choice: Skilled Nursing Facility Living arrangements for the past 2 months: Single Family Home                                       Social Determinants of Health (SDOH) Interventions    Readmission Risk Interventions No flowsheet data found.

## 2019-06-21 NOTE — Progress Notes (Signed)
PHARMACY CONSULT NOTE FOR:  OUTPATIENT  PARENTERAL ANTIBIOTIC THERAPY (OPAT)  Indication: Group C Strep bacteremia/endocarditis  Regimen:Penicillin G 24 million units every 24 hours as a continuous infusion End date: 07/15/19  Since plan for patient to go to SNF now, will continue penicillin G at discharge for SNF administration  IV antibiotic discharge orders are pended. To discharging provider:  please sign these orders via discharge navigator,  Select New Orders & click on the button choice - Manage This Unsigned Work.     Thank you for allowing pharmacy to be a part of this patient's care.  Sharin Mons, PharmD, BCPS, BCIDP Infectious Diseases Clinical Pharmacist Phone: [phone] 06/21/2019, 10:30 AM

## 2019-06-21 NOTE — Progress Notes (Signed)
Occupational Therapy Treatment Patient Details Name: Theodore Brown MRN: [id number] DOB: [birth year] Today's Date: 06/21/2019    History of present illness 54 yo admitted with neck pain and paraesthesias after 2 recent falls. Pt with metabolic encephalopathy and cervical facet fx. PMHx: ETOH abuse, arthritis, cirrhosis   OT comments  Pt making slow progress. Pt continues to be impulsive with mobility when on his feet which makes him a continued fall risk.  Pt wanted to stay in bed but with encouragement did get up and do adls in standing.  Attempted use of walker which appeared, at times, to make balance worse as he could not coordinate the use of it well.  Will continue to treat acutely. SNF best option to maximize safety and balance before returning home.     Follow Up Recommendations  SNF;Supervision/Assistance - 24 hour    Equipment Recommendations  None recommended by OT    Recommendations for Other Services      Precautions / Restrictions Precautions Precautions: Cervical;Fall Precaution Comments: pt not wearing cervical collar correctly on arrival.  Collar adjusted and precautions reviewed. Required Braces or Orthoses: Cervical Brace Cervical Brace: Hard collar;At all times Restrictions Weight Bearing Restrictions: No       Mobility Bed Mobility Overal bed mobility: Modified Independent             General bed mobility comments: used bedrails but otherwise no assist.  Transfers Overall transfer level: Needs assistance Equipment used: None Transfers: Sit to/from [employer] Sit to Stand: Supervision Stand pivot transfers: Min guard       General transfer comment: Pt moves quickly and unsafe at times.  Pt with shuffled gait when walking to bathroom.    Balance Overall balance assessment: History of Falls                                         ADL either performed or assessed with clinical judgement   ADL Overall  ADL's : Needs assistance/impaired Eating/Feeding: Independent;Sitting   Grooming: Min guard;Wash/dry hands;Wash/dry face;Oral care;Standing Grooming Details (indicate cue type and reason): Pt unsteady getting to sink but groomed leaning on sink with occasional assist for balance.             Lower Body Dressing: Min guard;Sit to/from stand Lower Body Dressing Details (indicate cue type and reason): Pt very unsteady when up on his feet.  Feel he would be safer in some ways with an assitive device but pt is so impulsive, he may fall using assistive device.  Toilet Transfer: Ambulation;Minimal assistance           Functional mobility during ADLs: Min guard;Cueing for safety General ADL Comments: Pt unsteady when on his feet and somewhat impulsive. Feel pt is a fall risk with or without assistive device during adls.     Vision   Additional Comments: Wears glasses but lost them years ago.  Pt could see tray in front of him and found adl items in room without assist.   Perception     Praxis      Cognition Arousal/Alertness: Awake/alert Behavior During Therapy: [facility] for tasks assessed/performed Overall Cognitive Status: Within Functional Limits for tasks assessed                                 General Comments: Pt with  slurred speech at times and not wanting to work with therapy but did do tasks asked of him with encouragement        Exercises     Shoulder Instructions       General Comments Pt unsteady when on his feet making him a continued fall risk.    Pertinent Vitals/ Pain       Pain Assessment: 0-10 Pain Score: 5  Pain Location: arms and back Pain Descriptors / Indicators: Grimacing;Guarding Pain Intervention(s): Limited activity within patient's tolerance;Monitored during session;Repositioned  Home Living                                          Prior Functioning/Environment              Frequency  Min 2X/week         Progress Toward Goals  OT Goals(current goals can now be found in the care plan section)  Progress towards OT goals: Progressing toward goals  Acute Rehab OT Goals Patient Stated Goal: return home and get medicaid started OT Goal Formulation: With patient Time For Goal Achievement: 07/02/19 Potential to Achieve Goals: Good ADL Goals Pt Will Perform Grooming: with modified independence;standing Pt Will Perform Lower Body Dressing: with modified independence;sit to/from stand Pt Will Transfer to Toilet: with modified independence;ambulating Pt Will Perform Tub/Shower Transfer: Tub transfer;with modified independence Additional ADL Goal #1: Pt will demonstrate independence with 3 fall prevention strategies. Additional ADL Goal #2: Pt will demonstrate independence with adherence to 3/3 cervical precautions during ADL/IADL and functional mobiltiy.  Plan Discharge plan remains appropriate    Co-evaluation                 AM-PAC OT "6 Clicks" Daily Activity     Outcome Measure   Help from another person eating meals?: None Help from another person taking care of personal grooming?: A Little Help from another person toileting, which includes using toliet, bedpan, or urinal?: A Little Help from another person bathing (including washing, rinsing, drying)?: A Little Help from another person to put on and taking off regular upper body clothing?: A Little Help from another person to put on and taking off regular lower body clothing?: A Little 6 Click Score: 19    End of Session Equipment Utilized During Treatment: Cervical collar  OT Visit Diagnosis: Unsteadiness on feet (R26.81);Other abnormalities of gait and mobility (R26.89);Muscle weakness (generalized) (M62.81);History of falling (Z91.81)   Activity Tolerance Patient tolerated treatment well   Patient Left in bed;with call bell/phone within reach;with bed alarm set;with family/visitor present   Nurse  Communication Mobility status        Time: 7425-9563 OT Time Calculation (min): 18 min  Charges: OT General Charges $OT Visit: 1 Visit OT Treatments $Self Care/Home Management : 8-22 mins   Glenford Peers 06/21/2019, 9:54 AM

## 2019-06-21 NOTE — Progress Notes (Signed)
Subjective: No new complaints   Antibiotics:  Anti-infectives (From admission, onward)   Start     Dose/Rate Route Frequency Ordered Stop   06/21/19 0800  penicillin G potassium 12 Million Units in dextrose 5 % 500 mL continuous infusion     12 Million Units 41.7 mL/hr over 12 Hours Intravenous Every 12 hours 06/20/19 0852     06/19/19 1800  vancomycin (VANCOREADY) IVPB 1250 mg/250 mL  Status:  Discontinued     1,250 mg 166.7 mL/hr over 90 Minutes Intravenous Every 12 hours 06/19/19 1722 06/20/19 0852   06/18/19 0600  vancomycin (VANCOREADY) IVPB 750 mg/150 mL  Status:  Discontinued     750 mg 150 mL/hr over 60 Minutes Intravenous Every 12 hours 06/17/19 1615 06/19/19 1722   06/17/19 1630  vancomycin (VANCOREADY) IVPB 1250 mg/250 mL     1,250 mg 166.7 mL/hr over 90 Minutes Intravenous  Once 06/17/19 1615 06/17/19 1837   06/17/19 0800  cefTRIAXone (ROCEPHIN) 2 g in sodium chloride 0.9 % 100 mL IVPB  Status:  Discontinued     2 g 200 mL/hr over 30 Minutes Intravenous Every 24 hours 06/17/19 0639 06/20/19 0852      Medications: Scheduled Meds: . ferrous sulfate  325 mg Oral BID WC  . folic acid  5 mg Oral Daily  . multivitamin with minerals  1 tablet Oral Daily  . thiamine  100 mg Oral Daily   Continuous Infusions: . penicillin g continuous IV infusion     PRN Meds:.acetaminophen, cyclobenzaprine, fentaNYL (SUBLIMAZE) injection, iohexol, oxyCODONE    Objective: Weight change:   Intake/Output Summary (Last 24 hours) at 06/21/2019 1030 Last data filed at 06/21/2019 0828 Gross per 24 hour  Intake 100 ml  Output 850 ml  Net -750 ml   Blood pressure (!) 154/77, pulse (!) 102, temperature (!) 101.4 F (38.6 C), temperature source Oral, resp. rate 18, height 5\' 7"  (1.702 m), weight 60.6 kg, SpO2 93 %. Temp:  [98.8 F (37.1 C)-101.4 F (38.6 C)] 101.4 F (38.6 C) (02/16 0822) Pulse Rate:  [93-102] 102 (02/16 0822) Resp:  [14-20] 18 (02/16 0822) BP:  (132-155)/(71-81) 154/77 (02/16 0822) SpO2:  [93 %-100 %] 93 % (02/16 03-[birth year]-1989)  Physical Exam: General: Alert and awake, oriented x3, not in any acute distress.w C collar, poor dention HEENT: anicteric sclera, EOMI CVS regular rate, normal no mgr Chest: , no wheezing, no respiratory distress, fairly clear anteriorly Abdomen: soft non-distended,  Extremities: no edema or deformity noted bilaterally Skin: multiple excoriated lesions that he says are due to "bed bug bites" Neuro: nonfocal  CBC:    BMET Recent Labs    06/20/19 0455 06/21/19 0248  NA 130* 130*  K 4.0 3.6  CL 101 103  CO2 20* 19*  GLUCOSE 83 120*  BUN 7 7  CREATININE 0.83 0.88  CALCIUM 8.2* 8.2*     Liver Panel  Recent Labs    06/20/19 0455 06/21/19 0248  ALBUMIN 1.6* 1.6*       Sedimentation Rate No results for input(s): ESRSEDRATE in the last 72 hours. C-Reactive Protein No results for input(s): CRP in the last 72 hours.  Micro Results: Recent Results (from the past 720 hour(s))  Respiratory Panel by RT PCR (Flu A&B, Covid) - Nasopharyngeal Swab     Status: None   Collection Time: 06/16/19  2:07 PM   Specimen: Nasopharyngeal Swab  Result Value Ref Range Status   SARS Coronavirus 2 by RT  PCR NEGATIVE NEGATIVE Final    Comment: (NOTE) SARS-CoV-2 target nucleic acids are NOT DETECTED. The SARS-CoV-2 RNA is generally detectable in upper respiratoy specimens during the acute phase of infection. The lowest concentration of SARS-CoV-2 viral copies this assay can detect is 131 copies/mL. A negative result does not preclude SARS-Cov-2 infection and should not be used as the sole basis for treatment or other patient management decisions. A negative result may occur with  improper specimen collection/handling, submission of specimen other than nasopharyngeal swab, presence of viral mutation(s) within the areas targeted by this assay, and inadequate number of viral copies (<131 copies/mL). A negative  result must be combined with clinical observations, patient history, and epidemiological information. The expected result is Negative. Fact Sheet for Patients:  https://www.moore.com/ Fact Sheet for Healthcare Providers:  https://www.young.biz/ This test is not yet ap proved or cleared by the [place] FDA and  has been authorized for detection and/or diagnosis of SARS-CoV-2 by FDA under an Emergency Use Authorization (EUA). This EUA will remain  in effect (meaning this test can be used) for the duration of the COVID-19 declaration under Section 564(b)(1) of the Act, 21 U.S.C. section 360bbb-3(b)(1), unless the authorization is terminated or revoked sooner.    Influenza A by PCR NEGATIVE NEGATIVE Final   Influenza B by PCR NEGATIVE NEGATIVE Final    Comment: (NOTE) The Xpert Xpress SARS-CoV-2/FLU/RSV assay is intended as an aid in  the diagnosis of influenza from Nasopharyngeal swab specimens and  should not be used as a sole basis for treatment. Nasal washings and  aspirates are unacceptable for Xpert Xpress SARS-CoV-2/FLU/RSV  testing. Fact Sheet for Patients: https://www.moore.com/ Fact Sheet for Healthcare Providers: https://www.young.biz/ This test is not yet approved or cleared by the [place] FDA and  has been authorized for detection and/or diagnosis of SARS-CoV-2 by  FDA under an Emergency Use Authorization (EUA). This EUA will remain  in effect (meaning this test can be used) for the duration of the  Covid-19 declaration under Section 564(b)(1) of the Act, 21  U.S.C. section 360bbb-3(b)(1), unless the authorization is  terminated or revoked. Performed at [facility], [address], [place], [place] [address]   Culture, blood (routine x 2)     Status: Abnormal   Collection Time: 06/16/19  3:50 PM   Specimen: BLOOD RIGHT ARM  Result Value Ref Range Status   Specimen  Description BLOOD RIGHT ARM  Final   Special Requests   Final    BOTTLES DRAWN AEROBIC AND ANAEROBIC Blood Culture adequate volume   Culture  Setup Time   Final    GRAM POSITIVE COCCI IN CHAINS IN BOTH AEROBIC AND ANAEROBIC BOTTLES CRITICAL RESULT CALLED TO, READ BACK BY AND VERIFIED WITH: Lieutenant Diego [id number] 06/17/2019 Girtha Hake Performed at [facility], [address], [place], [place] [address]    Culture STREPTOCOCCUS GROUP C (A)  Final   Report Status 06/20/2019 FINAL  Final   Organism ID, Bacteria STREPTOCOCCUS GROUP C  Final      Susceptibility   Streptococcus group c - MIC*    CLINDAMYCIN >=1 RESISTANT Resistant     AMPICILLIN <=0.25 SENSITIVE Sensitive     ERYTHROMYCIN >=8 RESISTANT Resistant     VANCOMYCIN 0.25 SENSITIVE Sensitive     CEFTRIAXONE <=0.12 SENSITIVE Sensitive     LEVOFLOXACIN <=0.25 SENSITIVE Sensitive     PENICILLIN Value in next row Sensitive      SENSITIVE0.06    * STREPTOCOCCUS GROUP  C  Culture, blood (routine x 2)     Status: Abnormal   Collection Time: 06/16/19  3:57 PM   Specimen: BLOOD RIGHT ARM  Result Value Ref Range Status   Specimen Description BLOOD RIGHT ARM  Final   Special Requests   Final    BOTTLES DRAWN AEROBIC AND ANAEROBIC Blood Culture adequate volume   Culture  Setup Time   Final    GRAM POSITIVE COCCI IN CHAINS IN BOTH AEROBIC AND ANAEROBIC BOTTLES CRITICAL RESULT CALLED TO, READ BACK BY AND VERIFIED WITH: G. ABBOTT,PHARMD [id number] 06/17/2019 T. TYSOR    Culture (A)  Final    STREPTOCOCCUS GROUP C SUSCEPTIBILITIES PERFORMED ON PREVIOUS CULTURE WITHIN THE LAST 5 DAYS. Performed at [facility], [address] [place], [place] [address]    Report Status 06/20/2019 FINAL  Final  Blood Culture ID Panel (Reflexed)     Status: Abnormal   Collection Time: 06/16/19  3:57 PM  Result Value Ref Range Status   Enterococcus species NOT DETECTED NOT DETECTED Final   Listeria monocytogenes NOT DETECTED NOT DETECTED Final    Staphylococcus species NOT DETECTED NOT DETECTED Final   Staphylococcus aureus (BCID) NOT DETECTED NOT DETECTED Final   Streptococcus species DETECTED (A) NOT DETECTED Final    Comment: Not Enterococcus species, Streptococcus agalactiae, Streptococcus pyogenes, or Streptococcus pneumoniae. CRITICAL RESULT CALLED TO, READ BACK BY AND VERIFIED WITH: G. ABBOTT,PHARMD [id number] 06/17/2019 T. TYSOR    Streptococcus agalactiae NOT DETECTED NOT DETECTED Final   Streptococcus pneumoniae NOT DETECTED NOT DETECTED Final   Streptococcus pyogenes NOT DETECTED NOT DETECTED Final   Acinetobacter baumannii NOT DETECTED NOT DETECTED Final   Enterobacteriaceae species NOT DETECTED NOT DETECTED Final   Enterobacter cloacae complex NOT DETECTED NOT DETECTED Final   Escherichia coli NOT DETECTED NOT DETECTED Final   Klebsiella oxytoca NOT DETECTED NOT DETECTED Final   Klebsiella pneumoniae NOT DETECTED NOT DETECTED Final   Proteus species NOT DETECTED NOT DETECTED Final   Serratia marcescens NOT DETECTED NOT DETECTED Final   Haemophilus influenzae NOT DETECTED NOT DETECTED Final   Neisseria meningitidis NOT DETECTED NOT DETECTED Final   Pseudomonas aeruginosa NOT DETECTED NOT DETECTED Final   Candida albicans NOT DETECTED NOT DETECTED Final   Candida glabrata NOT DETECTED NOT DETECTED Final   Candida krusei NOT DETECTED NOT DETECTED Final   Candida parapsilosis NOT DETECTED NOT DETECTED Final   Candida tropicalis NOT DETECTED NOT DETECTED Final    Comment: Performed at [facility], [address], [place], [place] [address]  SARS CORONAVIRUS 2 (TAT 6-24 HRS) Nasopharyngeal Nasopharyngeal Swab     Status: None   Collection Time: 06/16/19  7:13 PM   Specimen: Nasopharyngeal Swab  Result Value Ref Range Status   SARS Coronavirus 2 NEGATIVE NEGATIVE Final    Comment: (NOTE) SARS-CoV-2 target nucleic acids are NOT DETECTED. The SARS-CoV-2 RNA is generally detectable in upper and lower respiratory  specimens during the acute phase of infection. Negative results do not preclude SARS-CoV-2 infection, do not rule out co-infections with other pathogens, and should not be used as the sole basis for treatment or other patient management decisions. Negative results must be combined with clinical observations, patient history, and epidemiological information. The expected result is Negative. Fact Sheet for Patients: HairSlick.no Fact Sheet for Healthcare Providers: quierodirigir.com This test is not yet approved or cleared by the [place] FDA and  has been authorized for detection and/or diagnosis of SARS-CoV-2 by FDA under an  Emergency Use Authorization (EUA). This EUA will remain  in effect (meaning this test can be used) for the duration of the COVID-19 declaration under Section 56 4(b)(1) of the Act, 21 U.S.C. section 360bbb-3(b)(1), unless the authorization is terminated or revoked sooner. Performed at [facility], [place] [address], [place], [place] [address]   Culture, blood (routine x 2)     Status: None (Preliminary result)   Collection Time: 06/18/19  8:19 AM   Specimen: BLOOD LEFT FOREARM  Result Value Ref Range Status   Specimen Description BLOOD LEFT FOREARM  Final   Special Requests   Final    BOTTLES DRAWN AEROBIC ONLY Blood Culture adequate volume   Culture   Final    NO GROWTH 3 DAYS Performed at [facility], [place] [address] [place], [place] [address]    Report Status PENDING  Incomplete  Culture, blood (routine x 2)     Status: None (Preliminary result)   Collection Time: 06/18/19  8:19 AM   Specimen: BLOOD  Result Value Ref Range Status   Specimen Description BLOOD LEFT ANTECUBITAL  Final   Special Requests   Final    BOTTLES DRAWN AEROBIC ONLY Blood Culture adequate volume   Culture   Final    NO GROWTH 3 DAYS Performed at [facility], [place] [address], [place],  [address]     Report Status PENDING  Incomplete    Studies/Results: [place] EKG SITE RITE  Result Date: 06/21/2019 If Site Rite image not attached, placement could not be confirmed due to current cardiac rhythm.     Assessment/Plan:  INTERVAL HISTORY:  Patient likely to DC to SNF   Principal Problem:   Bacteremia due to Streptococcus Active Problems:   SIRS (systemic inflammatory response syndrome) (HCC)   Alcoholic liver disease (HCC)   Injury of cervical spine (HCC)    Theodore Brown is a 54 y.o. male with history of alcoholism admitted with tachycardia and hypotension after falls found to have extensive neck fractures and now group G streptococcal bacteremia.  He has poor dentition and I was initially suspecting that he was having a streptococcal infection that originated in the oropharynx but group G strep would suggest the skin.  Indeed he does have multiple excoriated lesions on the skin which could have been a portal for group G strep.  #1 Group G streptococcal bacteremia: We are not going to able to get a TEE on him to rule out or rule in endocarditis.  And said I think I would proceed with giving him at least 4 weeks of a beta-lactam.  In cases of actual endocarditis addition of an aminoglycoside for 2 weeks is considered.  I dont want to hazard that in this case  IF he goes to SNF I would simply consider with high dose on his own which can likely be given as a continuous infusion at the skilled nursing facility.  Stop date for the penicillin will be 07/15/2019:  We would want weekly CBC with differential and a CMP with GFR's faxed to our clinic at [phone].  He already has a hospital follow-up with me on August 01, 2019 at 9:45 AM.  I will place order for PICC line today and we will sign off for now please call with further questions.     LOS: 5 days   Alcide Evener 06/21/2019, 10:30 AM

## 2019-06-21 NOTE — Progress Notes (Signed)
PROGRESS NOTE  Theodore Brown ZOX:[id number] DOB: Mar 29, 1966   PCP: Clent Demark, PA-C  Patient is from: Home.  DOA: 06/16/2019 LOS: 5  Brief Narrative / Interim history: 54 year old male with history of alcohol abuse, liver cirrhosis, osteoarthritis and prior neck surgery presenting with neck pain and bilateral upper extremity paresthesia after fall 2 days ago.  Reportedly fell backward and hit his head without loss of consciousness.  Tred to manage his neck pain at home before he presented to ED.  In ED, hypotensive to 86/58 and tachycardic to 122.  But improved with IV fluid.  Afebrile.  Normal saturation on room air.  Hgb 7 (baseline 10.2).  MCV 66.  WBC 23.2. Na 124.  K3.4.  Cr 1.34 (b/l 0.92).  EKG LVH with bilateral atrial enlargement.  CTA head and neck without acute finding but degenerative disc and facet disease.  CTA chest/abdomen/pelvis revealed cirrhotic liver with bulky abdominal varices, splenomegaly, trace ascites and chronic bilateral rib fractures.  MRI cervical spine ordered.  Patient was admitted for SIRS, acute encephalopathy, volume depletion, cervical radiculopathy and electrolyte derangement.  The next morning, blood cultures grew GPC in chains.  ID consulted.  Started on IV ceftriaxone and vancomycin.  Repeat blood culture 06/18/2019 is remain negative.  Is on high-dose penicillin.  ID has signed off and is recommended Rocephin for total of 4 weeks.  PICC line is being placed today.  Echocardiogram with normal EF and no vegetation.  MRI cervical spine revealed acute cervical spine injury with evidence of bilateral C2-C3 facet disruption, C5-C6 disc space disruption, blood or fluid within the spinal canal from C2-C3 to C6-C7 contributing to spinal stenosis with cord mass-effect but no definite spinal cord contusion or edema and bulky prevertebral blood or fluid suspicious for anterior ligamentous injury.  Neurosurgery consulted and recommended Aspen collar,  conservative management and outpatient follow-up in 6 weeks for flexion-extension C-spine x-rays.    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 -Still with pain but improving. Continue pain control-Tylenol, Flexeril, oxycodone and fentanyl -SCD for VTE prophylaxis in the setting of bleeding and thrombocytopenia -PT/OT eval. interestingly, OT recommends SNF but PT recommends outpatient PT.  Severe sepsis due to Streptococcus bacteremia: Sepsis physiology improving.  Likely odontogenic source.  Denies IVDU.  TTE without significant finding or vegetation, unable to have TEE due to cervical fracture. -Appreciate guidance by ID -Antibiotics were switched to high-dose ampicillin by ID on 06/20/2019.  They recommend 2 g IV Rocephin outpatient for total of 4 weeks if he was to discharge home, otherwise continue high-dose penicillin at SNF if he goes to SNF.  Recent blood culture from 06/17/2018 were negative.  PICC line is being placed today.  Alcoholic liver cirrhosis/varices/splenomegaly Alcohol use disorder-admits drinking 3-4 beers a day. Elevated liver enzyme/hyperbilirubinemia-likely due to alcohol.  CK, acute hepatitis panel and HIV negative. Coagulopathy: INR 1.4.  Likely due to liver cirrhosis. -Encouraged cessation -CIWA with as needed Ativan. No signs of withdrawal currently. -Continue multivitamins and thiamine after banana bag  Acute metabolic encephalopathy: Likely due to the above.  Resolved. -Treat treatable causes  Acute kidney injury: Likely due to sepsis and dehydration.  Resolved.  Hyponatremia/hypophosphatemia: Likely due to alcohol abuse. -Replenish and recheck  Hypokalemia/hypomagnesemia-resolved.  Iron deficiency anemia: Baseline Hgb 8-9> 7.0 (admit) > 7.9>7.7>7.5 >7.6  >7.0>7.1> 7.5> 7.4.  Likely due to alcohol. LDH elevated.  Haptoglobin pending. So far work-up indicates iron deficiency anemia. Has microcytosis as well. Continue oral ferrous sulfate. Monitor CBC and transfuse if hemoglobin drops less than 7.  Leukocytosis/bandemia: Likely due to bacteremia.  Improving. -Continue monitoring  Thrombocytopenia: Likely due to alcohol.  About baseline. No signs of bleeding. -Continue monitoring.   DVT prophylaxis: SCD due to epidural bleed and thrombocytopenia Code Status: Full code Family Communication: Discussed in length with patient. He is alert, oriented and competent. No family present.  Called patient's mother Morton StallShirley Whiteside and discussed in length with her and answered several questions.  She was appreciative of the call. Discharge barrier:  PT OT recommends home health or SNF.  Reportedly, he has no power or gas in his home and his mother has other family members living with her so she is overwhelmed as well.  Per case manager, he has no insurance so cannot go to SNF.  Will need to be better than this in order to go home with home health and will also need IV antibiotics for 4 weeks per ID. Patient is from: Home Final disposition: Home with home health or SNF.  Consultants: Neurosurgery/ID    Subjective: Seen and examined.  Feels better.  Neck pain and shoulder pain improving.  No other complaint.  Reportedly he was agitated this morning and wanted to go home.  He is calm and reasonable with me.  He wants to go home but he understands that he has nowhere to go home.  He wishes to call some friends and make some arrangements.  Objective: Vitals:   06/20/19 1950 06/20/19 2339 06/21/19 0348 06/21/19 0822  BP: (!) 154/71 (!) 155/76 136/78 (!) 154/77  Pulse: 93 (!) 101 97 (!) 102  Resp: 16 20 16 18   Temp: 99.2 F (37.3 C) 100.1 F (37.8 C) 99.2 F (37.3 C) (!) 101.4 F (38.6 C)  TempSrc: Oral Oral Oral Oral  SpO2: 98% 98% 100% 93%  Weight:        Height:        Intake/Output Summary (Last 24 hours) at 06/21/2019 1052 Last data filed at 06/21/2019 [id number] Gross per 24 hour  Intake 100 ml  Output 850 ml  Net -750 ml   Filed Weights   06/16/19 1228 06/17/19 0030  Weight: 63.5 kg 60.6 kg    Examination:    General exam: Appears calm and comfortable, Aspen collar in place Respiratory system: Clear to auscultation. Respiratory effort normal. Cardiovascular system: S1 & S2 heard, RRR. No JVD, murmurs, rubs, gallops or clicks. No pedal edema. Gastrointestinal system: Abdomen is nondistended, soft and nontender. No organomegaly or masses felt. Normal bowel sounds heard. Central nervous system: Alert and oriented. No focal neurological deficits. Extremities: Symmetric 5 x 5 power. Skin: No rashes, lesions or ulcers.  Psychiatry: Judgement and insight appear normal. Mood & affect appropriate.    Procedures:  None   Microbiology summarized: COVID-19 negative. Influenza PCR negative. Blood cultures GPC in chains in 4 out of 4 bottles.  Sch Meds:  Scheduled Meds: . ferrous sulfate  325 mg Oral BID WC  . folic acid  5 mg Oral Daily  . multivitamin with minerals  1 tablet Oral Daily  . thiamine  100 mg Oral Daily   Continuous Infusions: . penicillin g continuous IV infusion     PRN Meds:.acetaminophen, cyclobenzaprine, fentaNYL (SUBLIMAZE) injection, iohexol, oxyCODONE  Antimicrobials: Anti-infectives (From admission, onward)   Start     Dose/Rate Route Frequency Ordered Stop   06/21/19 0800  penicillin G potassium 12 Million Units in dextrose 5 % 500 mL continuous infusion     12 Million Units 41.7 mL/hr over 12 Hours Intravenous Every 12 hours 06/20/19 0852     06/19/19 1800  vancomycin (VANCOREADY) IVPB 1250 mg/250 mL  Status:  Discontinued     1,250 mg 166.7 mL/hr over 90 Minutes Intravenous Every 12 hours 06/19/19 1722 06/20/19 0852   06/18/19 0600  vancomycin (VANCOREADY) IVPB 750 mg/150 mL  Status:  Discontinued      750 mg 150 mL/hr over 60 Minutes Intravenous Every 12 hours 06/17/19 1615 06/19/19 1722   06/17/19 1630  vancomycin (VANCOREADY) IVPB 1250 mg/250 mL     1,250 mg 166.7 mL/hr over 90 Minutes Intravenous  Once 06/17/19 1615 06/17/19 1837   06/17/19 0800  cefTRIAXone (ROCEPHIN) 2 g in sodium chloride 0.9 % 100 mL IVPB  Status:  Discontinued     2 g 200 mL/hr over 30 Minutes Intravenous Every 24 hours 06/17/19 0639 06/20/19 0852       I have personally reviewed the following labs and images: CBC: Recent Labs  Lab 06/17/19 0719 06/17/19 1501 06/18/19 0739 06/18/19 0739 06/19/19 0416 06/19/19 1203 06/19/19 2133 06/20/19 0455 06/21/19 0248  WBC 16.4*  --  17.1*  --  17.7*  --   --  18.3* 16.1*  HGB 7.7*   < > 7.6*   < > 7.0* 7.1* 7.5* 7.4* 7.5*  HCT 24.5*   < > 25.1*   < > 21.9* 23.0* 23.9* 24.6* 24.5*  MCV 66.6*  --  68.2*  --  66.2*  --   --  68.9* 69.6*  PLT 50*  --  56*  --  69*  --   --  100* 113*   < > = values in this interval not displayed.   BMP &GFR Recent Labs  Lab 06/16/19 2350 06/17/19 0719 06/17/19 1046 06/18/19 0739 06/19/19 0416 06/20/19 0455 06/21/19 0248  NA  --  128*  --  127* 130* 130* 130*  K  --  3.4*  --  4.0 3.2* 4.0 3.6  CL  --  97*  --  97* 99 101 103  CO2  --  23  --  21* 21* 20* 19*  GLUCOSE  --  89  --  94 93 83 120*  BUN  --  20  --  10 9 7 7   CREATININE  --  0.99  --  0.80 0.85 0.83 0.88  CALCIUM  --  7.7*  --  7.8* 7.9* 8.2* 8.2*  MG   < >  --  2.0 1.9 1.9 1.8 1.7  PHOS   < >  --  <1.0* 1.7*  1.6* 2.2* 2.4* 2.8   < > = values in this interval not displayed.   Estimated Creatinine Clearance: 84.2 mL/min (by C-G formula based on SCr of 0.88 mg/dL). Liver & Pancreas: Recent Labs  Lab 06/16/19 1234 06/16/19 1234 06/16/19 2350 06/16/19 2350 06/17/19 0719 06/18/19 0739 06/19/19 0416 06/20/19 0455 06/21/19 0248  AST 104*  --  96*  --  88* 97*  --   --   --   ALT 45*  --  40  --  41 40  --   --   --   ALKPHOS 85  --  92  --  85  106  --   --   --   BILITOT 2.4*  --  3.1*  --  2.6* 2.8*  --   --   --  PROT 6.6  --  6.1*  --  5.9* 6.0*  --   --   --   ALBUMIN 2.1*   < > 2.0*   < > 1.8* 1.7*  1.7* 1.6* 1.6* 1.6*   < > = values in this interval not displayed.   No results for input(s): LIPASE, AMYLASE in the last 168 hours. Recent Labs  Lab 06/16/19 1344 06/17/19 2119  AMMONIA 30 33   Diabetic: No results for input(s): HGBA1C in the last 72 hours. Recent Labs  Lab 06/16/19 1350 06/17/19 0605  GLUCAP 96 89   Cardiac Enzymes: Recent Labs  Lab 06/18/19 0739  CKTOTAL 38*   No results for input(s): PROBNP in the last 8760 hours. Coagulation Profile: Recent Labs  Lab 06/17/19 1046 06/18/19 0739 06/19/19 0416 06/20/19 0455 06/21/19 0248  INR 1.4* 1.4* 1.4* 1.3* 1.4*   Thyroid Function Tests: No results for input(s): TSH, T4TOTAL, FREET4, T3FREE, THYROIDAB in the last 72 hours. Lipid Profile: No results for input(s): CHOL, HDL, LDLCALC, TRIG, CHOLHDL, LDLDIRECT in the last 72 hours. Anemia Panel: No results for input(s): VITAMINB12, FOLATE, FERRITIN, TIBC, IRON, RETICCTPCT in the last 72 hours. Urine analysis:    Component Value Date/Time   COLORURINE AMBER (A) 06/16/2019 1317   APPEARANCEUR CLEAR 06/16/2019 1317   LABSPEC 1.019 06/16/2019 1317   PHURINE 5.0 06/16/2019 1317   GLUCOSEU NEGATIVE 06/16/2019 1317   HGBUR NEGATIVE 06/16/2019 1317   BILIRUBINUR NEGATIVE 06/16/2019 1317   BILIRUBINUR small 01/28/2017 1422   KETONESUR NEGATIVE 06/16/2019 1317   PROTEINUR NEGATIVE 06/16/2019 1317   UROBILINOGEN >=8.0 (A) 01/28/2017 1422   UROBILINOGEN 0.2 01/05/2013 0716   NITRITE NEGATIVE 06/16/2019 1317   LEUKOCYTESUR NEGATIVE 06/16/2019 1317   Sepsis Labs: Invalid input(s): PROCALCITONIN, LACTICIDVEN  Microbiology: Recent Results (from the past 240 hour(s))  Respiratory Panel by RT PCR (Flu A&B, Covid) - Nasopharyngeal Swab     Status: None   Collection Time: 06/16/19  2:07 PM   Specimen:  Nasopharyngeal Swab  Result Value Ref Range Status   SARS Coronavirus 2 by RT PCR NEGATIVE NEGATIVE Final    Comment: (NOTE) SARS-CoV-2 target nucleic acids are NOT DETECTED. The SARS-CoV-2 RNA is generally detectable in upper respiratoy specimens during the acute phase of infection. The lowest concentration of SARS-CoV-2 viral copies this assay can detect is 131 copies/mL. A negative result does not preclude SARS-Cov-2 infection and should not be used as the sole basis for treatment or other patient management decisions. A negative result may occur with  improper specimen collection/handling, submission of specimen other than nasopharyngeal swab, presence of viral mutation(s) within the areas targeted by this assay, and inadequate number of viral copies (<131 copies/mL). A negative result must be combined with clinical observations, patient history, and epidemiological information. The expected result is Negative. Fact Sheet for Patients:  https://www.moore.com/ Fact Sheet for Healthcare Providers:  https://www.young.biz/ This test is not yet ap proved or cleared by the [place] FDA and  has been authorized for detection and/or diagnosis of SARS-CoV-2 by FDA under an Emergency Use Authorization (EUA). This EUA will remain  in effect (meaning this test can be used) for the duration of the COVID-19 declaration under Section 564(b)(1) of the Act, 21 U.S.C. section 360bbb-3(b)(1), unless the authorization is terminated or revoked sooner.    Influenza A by PCR NEGATIVE NEGATIVE Final   Influenza B by PCR NEGATIVE NEGATIVE Final    Comment: (NOTE) The Xpert Xpress SARS-CoV-2/FLU/RSV assay is intended as an aid  in  the diagnosis of influenza from Nasopharyngeal swab specimens and  should not be used as a sole basis for treatment. Nasal washings and  aspirates are unacceptable for Xpert Xpress SARS-CoV-2/FLU/RSV  testing. Fact Sheet for  Patients: https://www.moore.com/https://www.fda.gov/media/142436/download Fact Sheet for Healthcare Providers: https://www.young.biz/https://www.fda.gov/media/142435/download This test is not yet approved or cleared by the [place] FDA and  has been authorized for detection and/or diagnosis of SARS-CoV-2 by  FDA under an Emergency Use Authorization (EUA). This EUA will remain  in effect (meaning this test can be used) for the duration of the  Covid-19 declaration under Section 564(b)(1) of the Act, 21  U.S.C. section 360bbb-3(b)(1), unless the authorization is  terminated or revoked. Performed at [facility], [address] [place], [place] [address]   Culture, blood (routine x 2)     Status: Abnormal   Collection Time: 06/16/19  3:50 PM   Specimen: BLOOD RIGHT ARM  Result Value Ref Range Status   Specimen Description BLOOD RIGHT ARM  Final   Special Requests   Final    BOTTLES DRAWN AEROBIC AND ANAEROBIC Blood Culture adequate volume   Culture  Setup Time   Final    GRAM POSITIVE COCCI IN CHAINS IN BOTH AEROBIC AND ANAEROBIC BOTTLES CRITICAL RESULT CALLED TO, READ BACK BY AND VERIFIED WITH: Lieutenant DiegoG. ABBOTT,PHARMD [id number] 06/17/2019 Girtha Hake. TYSOR Performed at [facility], [address] [place], [place] [address]    Culture STREPTOCOCCUS GROUP C (A)  Final   Report Status 06/20/2019 FINAL  Final   Organism ID, Bacteria STREPTOCOCCUS GROUP C  Final      Susceptibility   Streptococcus group c - MIC*    CLINDAMYCIN >=1 RESISTANT Resistant     AMPICILLIN <=0.25 SENSITIVE Sensitive     ERYTHROMYCIN >=8 RESISTANT Resistant     VANCOMYCIN 0.25 SENSITIVE Sensitive     CEFTRIAXONE <=0.12 SENSITIVE Sensitive     LEVOFLOXACIN <=0.25 SENSITIVE Sensitive     PENICILLIN Value in next row Sensitive      SENSITIVE0.06    * STREPTOCOCCUS GROUP C  Culture, blood (routine x 2)     Status: Abnormal   Collection Time: 06/16/19  3:57 PM   Specimen: BLOOD RIGHT ARM  Result Value Ref Range Status   Specimen Description BLOOD RIGHT  ARM  Final   Special Requests   Final    BOTTLES DRAWN AEROBIC AND ANAEROBIC Blood Culture adequate volume   Culture  Setup Time   Final    GRAM POSITIVE COCCI IN CHAINS IN BOTH AEROBIC AND ANAEROBIC BOTTLES CRITICAL RESULT CALLED TO, READ BACK BY AND VERIFIED WITH: G. ABBOTT,PHARMD [id number] 06/17/2019 T. TYSOR    Culture (A)  Final    STREPTOCOCCUS GROUP C SUSCEPTIBILITIES PERFORMED ON PREVIOUS CULTURE WITHIN THE LAST 5 DAYS. Performed at [facility], [address] [place], [place] [address]    Report Status 06/20/2019 FINAL  Final  Blood Culture ID Panel (Reflexed)     Status: Abnormal   Collection Time: 06/16/19  3:57 PM  Result Value Ref Range Status   Enterococcus species NOT DETECTED NOT DETECTED Final   Listeria monocytogenes NOT DETECTED NOT DETECTED Final   Staphylococcus species NOT DETECTED NOT DETECTED Final   Staphylococcus aureus (BCID) NOT DETECTED NOT DETECTED Final   Streptococcus species DETECTED (A) NOT DETECTED Final    Comment: Not Enterococcus species, Streptococcus agalactiae, Streptococcus pyogenes, or Streptococcus pneumoniae. CRITICAL RESULT CALLED TO, READ BACK BY AND VERIFIED WITH: G. ABBOTT,PHARMD  [id number] 06/17/2019 T. TYSOR    Streptococcus agalactiae NOT DETECTED NOT DETECTED Final   Streptococcus pneumoniae NOT DETECTED NOT DETECTED Final   Streptococcus pyogenes NOT DETECTED NOT DETECTED Final   Acinetobacter baumannii NOT DETECTED NOT DETECTED Final   Enterobacteriaceae species NOT DETECTED NOT DETECTED Final   Enterobacter cloacae complex NOT DETECTED NOT DETECTED Final   Escherichia coli NOT DETECTED NOT DETECTED Final   Klebsiella oxytoca NOT DETECTED NOT DETECTED Final   Klebsiella pneumoniae NOT DETECTED NOT DETECTED Final   Proteus species NOT DETECTED NOT DETECTED Final   Serratia marcescens NOT DETECTED NOT DETECTED Final   Haemophilus influenzae NOT DETECTED NOT DETECTED Final   Neisseria meningitidis NOT DETECTED NOT DETECTED Final     Pseudomonas aeruginosa NOT DETECTED NOT DETECTED Final   Candida albicans NOT DETECTED NOT DETECTED Final   Candida glabrata NOT DETECTED NOT DETECTED Final   Candida krusei NOT DETECTED NOT DETECTED Final   Candida parapsilosis NOT DETECTED NOT DETECTED Final   Candida tropicalis NOT DETECTED NOT DETECTED Final    Comment: Performed at [facility], [address], [place], [place] [address]  SARS CORONAVIRUS 2 (TAT 6-24 HRS) Nasopharyngeal Nasopharyngeal Swab     Status: None   Collection Time: 06/16/19  7:13 PM   Specimen: Nasopharyngeal Swab  Result Value Ref Range Status   SARS Coronavirus 2 NEGATIVE NEGATIVE Final    Comment: (NOTE) SARS-CoV-2 target nucleic acids are NOT DETECTED. The SARS-CoV-2 RNA is generally detectable in upper and lower respiratory specimens during the acute phase of infection. Negative results do not preclude SARS-CoV-2 infection, do not rule out co-infections with other pathogens, and should not be used as the sole basis for treatment or other patient management decisions. Negative results must be combined with clinical observations, patient history, and epidemiological information. The expected result is Negative. Fact Sheet for Patients: HairSlick.no Fact Sheet for Healthcare Providers: quierodirigir.com This test is not yet approved or cleared by the [place] FDA and  has been authorized for detection and/or diagnosis of SARS-CoV-2 by FDA under an Emergency Use Authorization (EUA). This EUA will remain  in effect (meaning this test can be used) for the duration of the COVID-19 declaration under Section 56 4(b)(1) of the Act, 21 U.S.C. section 360bbb-3(b)(1), unless the authorization is terminated or revoked sooner. Performed at [facility], [address], [place], [place] [address]   Culture, blood (routine x 2)     Status: None (Preliminary result)   Collection Time:  06/18/19  8:19 AM   Specimen: BLOOD LEFT FOREARM  Result Value Ref Range Status   Specimen Description BLOOD LEFT FOREARM  Final   Special Requests   Final    BOTTLES DRAWN AEROBIC ONLY Blood Culture adequate volume   Culture   Final    NO GROWTH 3 DAYS Performed at [facility], [address] [place], [place] [address]    Report Status PENDING  Incomplete  Culture, blood (routine x 2)     Status: None (Preliminary result)   Collection Time: 06/18/19  8:19 AM   Specimen: BLOOD  Result Value Ref Range Status   Specimen Description BLOOD LEFT ANTECUBITAL  Final   Special Requests   Final    BOTTLES DRAWN AEROBIC ONLY Blood Culture adequate volume   Culture   Final    NO GROWTH 3 DAYS Performed at [facility], [address] [place], [place] [address]    Report Status  PENDING  Incomplete    Radiology Studies: [place] EKG SITE RITE  Result Date: 06/21/2019 If Site Rite image not attached, placement could not be confirmed due to current cardiac rhythm.   Hughie Closs, MD [facility] If 7PM-7AM, please contact night-coverage [email or web address] 06/21/2019, 10:52 AM

## 2019-06-21 NOTE — TOC Initial Note (Signed)
Transition of Care [facility]) - Initial/Assessment Note    Patient Details  Name: Theodore Brown MRN: [id number] Date of Birth: [birth year]  Transition [facility]) CM/SW Contact:    Pollie Friar, RN Phone Number: 06/21/2019, 7:57 AM  Clinical Narrative:                 Patient lives home alone. He is in agreement with either going home or SNF. Pt states his mother would be who would help him at home. CM asked to reach out to his mother and he agreed. CM spoke to patients mother and she states she can not take the patient into her home d/t other family staying with her. She also states she cannot assist him at home with the IV abx.  Mother states the patient has no power and no gas at his home. CM will provide him resources to assist with getting his power and gas turned back on.  CM asked pt if he would be agreeable to SNF for IV abx and he states he will go to SNF.  TOC will work up for SNF and continue to follow.  Expected Discharge Plan: Skilled Nursing Facility Barriers to Discharge: Continued Medical Work up, Inadequate or no insurance, Unsafe home situation   Patient Goals and CMS Choice        Expected Discharge Plan and Services Expected Discharge Plan: [place] In-house Referral: Clinical Social Work Discharge Planning Services: CM Consult Post Acute Care Choice: [place] arrangements for the past 2 months: [place]                                      Prior Living Arrangements/Services Living arrangements for the past 2 months: Single Family Home Lives with:: Self Patient language and need for interpreter reviewed:: Yes Do you feel safe going back to the place where you live?: Yes      Need for Family Participation in Patient Care: Yes (Comment) Care giver support system in place?: No (comment)   Criminal Activity/Legal Involvement Pertinent to Current Situation/Hospitalization: No - Comment as  needed  Activities of Daily Living Home Assistive Devices/Equipment: None ADL Screening (condition at time of admission) Patient's cognitive ability adequate to safely complete daily activities?: Yes Is the patient deaf or have difficulty hearing?: No Does the patient have difficulty seeing, even when wearing glasses/contacts?: No Does the patient have difficulty concentrating, remembering, or making decisions?: No Patient able to express need for assistance with ADLs?: No Does the patient have difficulty dressing or bathing?: No Independently performs ADLs?: Yes (appropriate for developmental age) Communication: Independent Dressing (OT): Independent Grooming: Independent Feeding: Independent Bathing: Independent Toileting: Independent In/Out Bed: Independent Walks in Home: Independent Does the patient have difficulty walking or climbing stairs?: No Weakness of Legs: Both(recent) Weakness of Arms/Hands: Both(right arm is weaker)  Permission Sought/Granted                  Emotional Assessment Appearance:: Appears stated age   Affect (typically observed): Pleasant, Restless Orientation: : Oriented to Self, Oriented to Place Alcohol / Substance Use: Alcohol Use Psych Involvement: No (comment)  Admission diagnosis:  Dehydration [E86.0] Alcoholism ([place]) [F10.20] SIRS (systemic inflammatory response syndrome) ([place]) [R65.10] Anemia, unspecified type [D64.9] Leukocytosis, unspecified type [D72.829] Patient Active Problem List   Diagnosis Date Noted  . Bacteremia due to Streptococcus 06/17/2019  . Injury  of cervical spine (HCC) 06/17/2019  . Pancytopenia (HCC) 08/27/2017  . Elevated liver enzymes 08/27/2017  . Polysubstance abuse (HCC) 08/27/2017  . Acute metabolic encephalopathy 08/27/2017  . Acute encephalopathy 08/27/2017  . Peripheral edema 09/16/2016  . Alcohol abuse 09/16/2016  . Marijuana use 09/16/2016  . Bilateral lower extremity edema 09/16/2016  .  Arthritis 02/03/2013  . Alcoholic liver disease (HCC) 01/09/2013  . Alcohol withdrawal delirium (HCC) 01/06/2013  . Thrombocytopenia, unspecified (HCC) 01/05/2013  . Salmonella bacteremia 01/05/2013  . Hyponatremia 01/04/2013  . Hypokalemia 01/04/2013  . Alcohol dependence (HCC) 01/04/2013  . Fever 01/04/2013  . Acute pancreatitis 01/04/2013  . SIRS (systemic inflammatory response syndrome) (HCC) 01/04/2013   PCP:  Loletta Specter, PA-C Pharmacy:   [facility] [phone] - [place] Otto, [place] - [address] AT [facility] [address] [facility] [address] [place] [address] Phone: [phone] Fax: [phone]     Social Determinants of Health (SDOH) Interventions    Readmission Risk Interventions No flowsheet data found.

## 2019-06-22 LAB — MAGNESIUM: Magnesium: 1.9 mg/dL (ref 1.7–2.4)

## 2019-06-22 LAB — CBC
HCT: 26.5 % — ABNORMAL LOW (ref 39.0–52.0)
Hemoglobin: 7.9 g/dL — ABNORMAL LOW (ref 13.0–17.0)
MCH: 21.9 pg — ABNORMAL LOW (ref 26.0–34.0)
MCHC: 29.8 g/dL — ABNORMAL LOW (ref 30.0–36.0)
MCV: 73.6 fL — ABNORMAL LOW (ref 80.0–100.0)
Platelets: 117 10*3/uL — ABNORMAL LOW (ref 150–400)
RBC: 3.6 MIL/uL — ABNORMAL LOW (ref 4.22–5.81)
RDW: 29.9 % — ABNORMAL HIGH (ref 11.5–15.5)
WBC: 13.4 10*3/uL — ABNORMAL HIGH (ref 4.0–10.5)
nRBC: 0.5 % — ABNORMAL HIGH (ref 0.0–0.2)

## 2019-06-22 LAB — RENAL FUNCTION PANEL
Albumin: 1.7 g/dL — ABNORMAL LOW (ref 3.5–5.0)
Anion gap: 5 (ref 5–15)
BUN: 9 mg/dL (ref 6–20)
CO2: 21 mmol/L — ABNORMAL LOW (ref 22–32)
Calcium: 8 mg/dL — ABNORMAL LOW (ref 8.9–10.3)
Chloride: 102 mmol/L (ref 98–111)
Creatinine, Ser: 0.8 mg/dL (ref 0.61–1.24)
GFR calc Af Amer: >60 mL/min (ref >60)
GFR calc non Af Amer: >60 mL/min (ref >60)
Glucose, Bld: 104 mg/dL — ABNORMAL HIGH (ref 70–99)
Phosphorus: 2.6 mg/dL (ref 2.5–4.6)
Potassium: 3.7 mmol/L (ref 3.5–5.1)
Sodium: 128 mmol/L — ABNORMAL LOW (ref 135–145)

## 2019-06-22 LAB — PROTIME-INR
INR: 1.3 — ABNORMAL HIGH (ref 0.8–1.2)
Prothrombin Time: 16.5 seconds — ABNORMAL HIGH (ref 11.4–15.2)

## 2019-06-22 MED ORDER — SODIUM CHLORIDE 0.9% FLUSH
10.0000 mL | Freq: Two times a day (BID) | INTRAVENOUS | Status: DC
  Administered 2019-06-22 – 2019-06-23 (×3): 10 mL

## 2019-06-22 MED ORDER — SODIUM CHLORIDE 0.9% FLUSH
10.0000 mL | INTRAVENOUS | Status: DC | PRN
  Administered 2019-06-22: 10 mL

## 2019-06-22 MED ORDER — CHLORHEXIDINE GLUCONATE CLOTH 2 % EX PADS
6.0000 | MEDICATED_PAD | Freq: Every day | CUTANEOUS | Status: DC
  Administered 2019-06-22 – 2019-06-23 (×2): 6 via TOPICAL

## 2019-06-22 NOTE — TOC Progression Note (Signed)
Transition of Care [facility]) - Progression Note    Patient Details  Name: Theodore Brown MRN: [id number] Date of Birth: November 21, 1965  Transition of Care [facility]) CM/SW Contact  Kermit Balo, RN Phone Number: 06/22/2019, 3:23 PM  Clinical Narrative:    CM has faxed out information to SNF with patients permission. Pt will require IV abx until 3/12 per notes.  CM called and attempted to reach pts mother without success and unable to leave voicemail.  TOC following.   Expected Discharge Plan: Skilled Nursing Facility Barriers to Discharge: Continued Medical Work up, Inadequate or no insurance, Unsafe home situation  Expected Discharge Plan and Services Expected Discharge Plan: Skilled Nursing Facility In-house Referral: Clinical Social Work Discharge Planning Services: CM Consult Post Acute Care Choice: Skilled Nursing Facility Living arrangements for the past 2 months: Single Family Home                                       Social Determinants of Health (SDOH) Interventions    Readmission Risk Interventions No flowsheet data found.

## 2019-06-22 NOTE — Progress Notes (Addendum)
Physical Therapy Treatment Patient Details Name: Theodore Brown MRN: [id number] DOB: [birth year] Today's Date: 06/22/2019    History of Present Illness 54 yo admitted with neck pain and paraesthesias after 2 recent falls. Pt with metabolic encephalopathy and cervical facet fx. PMHx: ETOH abuse, arthritis, cirrhosis    PT Comments    Pt performed gt training and functional mobility this session with less impulsivity.  Pt continues to benefit from skilled rehab to emphasize cervical precautions and progress functional mobility.  He continues to benefit from SNF placement as he lives home alone this session.      Follow Up Recommendations  SNF(If he declines HHPT he will require charity HHPT atb d/c and DME listed below.)     Equipment Recommendations  Other (comment)(4 wheeled RW with a seat.)    Recommendations for Other Services       Precautions / Restrictions Precautions Precautions: Cervical;Fall Precaution Booklet Issued: Yes (comment) Precaution Comments: Pt lying in bed on arrival without cervical collar donned he reports he was told he did not need to wear in bed. Required Braces or Orthoses: Cervical Brace Cervical Brace: Hard collar;Other (comment)(when sitting or ambulating, may remove when in bed or showering.) Restrictions Weight Bearing Restrictions: No    Mobility  Bed Mobility Overal bed mobility: Needs Assistance Bed Mobility: Rolling;Sidelying to Sit;Sit to Sidelying Rolling: Min assist Sidelying to sit: Min assist     Sit to sidelying: Min assist General bed mobility comments: Pt required assistance to mobilize and maintain log roll during transition to supine.  He required assistance to lift B LEs back to bed against gravity.  Transfers Overall transfer level: Needs assistance Equipment used: Rolling walker (2 wheeled) Transfers: Sit to/from Stand Sit to Stand: Min guard         General transfer comment: Cues for hand placement to and from  seated surface. Pt is less impulsive this session and more recpetive to following commands.  Ambulation/Gait Ambulation/Gait assistance: Min guard Gait Distance (Feet): 140 Feet Assistive device: Rolling walker (2 wheeled) Gait Pattern/deviations: Step-through pattern;Decreased stride length     General Gait Details: No LOB this session with RW.  He required max VCs for safety throughout session.  Assistance to turn device and back with device.   Stairs             Wheelchair Mobility    Modified Rankin (Stroke Patients Only)       Balance Overall balance assessment: History of Falls                                          Cognition Arousal/Alertness: Awake/alert Behavior During Therapy: WFL for tasks assessed/performed Overall Cognitive Status: Within Functional Limits for tasks assessed                                        Exercises General Exercises - Lower Extremity Heel Raises: AROM;Both;10 reps;Standing Mini-Sqauts: AROM;Both;10 reps;Standing    General Comments        Pertinent Vitals/Pain Pain Assessment: 0-10 Faces Pain Scale: Hurts little more Pain Location: arms and back Pain Descriptors / Indicators: Grimacing;Guarding Pain Intervention(s): Monitored during session;Repositioned    Home Living  Prior Function            PT Goals (current goals can now be found in the care plan section) Acute Rehab PT Goals Patient Stated Goal: return home and get medicaid started Potential to Achieve Goals: Fair Progress towards PT goals: Progressing toward goals    Frequency    Min 3X/week      PT Plan Current plan remains appropriate    Co-evaluation              AM-PAC PT "6 Clicks" Mobility   Outcome Measure  Help needed turning from your back to your side while in a flat bed without using bedrails?: A Little Help needed moving from lying on your back to sitting on  the side of a flat bed without using bedrails?: A Little Help needed moving to and from a bed to a chair (including a wheelchair)?: A Little Help needed standing up from a chair using your arms (e.g., wheelchair or bedside chair)?: A Little Help needed to walk in hospital room?: A Little Help needed climbing 3-5 steps with a railing? : A Little 6 Click Score: 18    End of Session Equipment Utilized During Treatment: Gait belt;Cervical collar Activity Tolerance: Patient tolerated treatment well Patient left: in bed;with call bell/phone within reach;with bed alarm set Nurse Communication: Mobility status;Precautions PT Visit Diagnosis: Other abnormalities of gait and mobility (R26.89)     Time: 5027-7412 PT Time Calculation (min) (ACUTE ONLY): 22 min  Charges:  $Gait Training: 8-22 mins                     [name] , PTA Acute Rehabilitation Services Pager [phone] Office [phone]     Gianne Shugars Eli Hose 06/22/2019, 3:56 PM

## 2019-06-22 NOTE — Progress Notes (Signed)
Peripherally Inserted Central Catheter/Midline Placement  The IV Nurse has discussed with the patient and/or persons authorized to consent for the patient, the purpose of this procedure and the potential benefits and risks involved with this procedure.  The benefits include less needle sticks, lab draws from the catheter, and the patient may be discharged home with the catheter. Risks include, but not limited to, infection, bleeding, blood clot (thrombus formation), and puncture of an artery; nerve damage and irregular heartbeat and possibility to perform a PICC exchange if needed/ordered by physician.  Alternatives to this procedure were also discussed.  Bard Power PICC patient education guide, fact sheet on infection prevention and patient information card has been provided to patient /or left at bedside.    PICC/Midline Placement Documentation  PICC Single Lumen 06/22/19 PICC Right Basilic 40 cm 1 cm (Active)  Indication for Insertion or Continuance of Line Home intravenous therapies (PICC only) 06/22/19 1800  Exposed Catheter (cm) 1 cm 06/22/19 1800  Site Assessment Clean;Dry;Intact 06/22/19 1800  Line Status Flushed;Saline locked;Blood return noted 06/22/19 1800  Dressing Type Transparent;Securing device 06/22/19 1800  Dressing Status Clean;Dry;Intact;Antimicrobial disc in place 06/22/19 1800  Dressing Change Due 06/29/19 06/22/19 1800       Franne Grip Renee 06/22/2019, 6:00 PM

## 2019-06-22 NOTE — NC FL2 (Signed)
[place] MEDICAID FL2 LEVEL OF CARE SCREENING TOOL     IDENTIFICATION  Patient Name: Theodore Brown Birthdate: 06/07/1965 Sex: male Admission Date (Current Location): 06/16/2019  [facility] and [place] Number:  Producer, television/film/video and Address:  [facility] [place], [address] [place], [place] [address]      Provider Number: [id number]  Attending Physician Name and Address:  Fran Lowes, DO  Relative Name and Phone Number:       Current Level of Care: Hospital Recommended Level of Care: Skilled Nursing Facility Prior Approval Number:    Date Approved/Denied:   PASRR Number: under review  Discharge Plan: SNF    Current Diagnoses: Patient Active Problem List   Diagnosis Date Noted  . Bacteremia due to Streptococcus 06/17/2019  . Injury of cervical spine (HCC) 06/17/2019  . Pancytopenia (HCC) 08/27/2017  . Elevated liver enzymes 08/27/2017  . Polysubstance abuse (HCC) 08/27/2017  . Acute metabolic encephalopathy 08/27/2017  . Acute encephalopathy 08/27/2017  . Peripheral edema 09/16/2016  . Alcohol abuse 09/16/2016  . Marijuana use 09/16/2016  . Bilateral lower extremity edema 09/16/2016  . Arthritis 02/03/2013  . Alcoholic liver disease (HCC) 01/09/2013  . Alcohol withdrawal delirium (HCC) 01/06/2013  . Thrombocytopenia, unspecified (HCC) 01/05/2013  . Salmonella bacteremia 01/05/2013  . Hyponatremia 01/04/2013  . Hypokalemia 01/04/2013  . Alcohol dependence (HCC) 01/04/2013  . Fever 01/04/2013  . Acute pancreatitis 01/04/2013  . SIRS (systemic inflammatory response syndrome) (HCC) 01/04/2013    Orientation RESPIRATION BLADDER Height & Weight     Self, Time, Situation, Place  Normal Continent Weight: 60.6 kg(scale a) Height:  5\' 7"  (170.2 cm)  BEHAVIORAL SYMPTOMS/MOOD NEUROLOGICAL BOWEL NUTRITION STATUS      Continent Diet(heart healthy)  AMBULATORY STATUS COMMUNICATION OF NEEDS Skin   Limited Assist Verbally Normal                       Personal Care Assistance Level of Assistance  Bathing, Feeding, Dressing Bathing Assistance: Limited assistance Feeding assistance: Independent Dressing Assistance: Limited assistance     Functional Limitations Info  Sight, Hearing, Speech Sight Info: Adequate Hearing Info: Adequate Speech Info: Adequate    SPECIAL CARE FACTORS FREQUENCY  PT (By licensed PT), OT (By licensed OT)     PT Frequency: 5x/wk OT Frequency: 5x/wk            Contractures Contractures Info: Not present    Additional Factors Info  Code Status, Allergies Code Status Info: Full Code Allergies Info: NKA           Current Medications (06/22/2019):  This is the current hospital active medication list Current Facility-Administered Medications  Medication Dose Route Frequency Provider Last Rate Last Admin  . acetaminophen (TYLENOL) tablet 650 mg  650 mg Oral Q6H PRN 06/24/2019, MD   650 mg at 06/22/19 0551  . cyclobenzaprine (FLEXERIL) tablet 10 mg  10 mg Oral TID PRN 06/24/19 D, NP   10 mg at 06/20/19 2352  . fentaNYL (SUBLIMAZE) injection 50 mcg  50 mcg Intravenous Q2H PRN Gonfa, Taye T, MD      . ferrous sulfate tablet 325 mg  325 mg Oral BID WC 06/22/19 I, MD   325 mg at 06/22/19 0640  . folic acid (FOLVITE) tablet 5 mg  5 mg Oral Daily 06/24/19 T, MD   5 mg at 06/22/19 1049  . iohexol (OMNIPAQUE) 350 MG/ML injection 100 mL  100 mL  Intravenous Once PRN Mitzi Hansen, MD      . multivitamin with minerals tablet 1 tablet  1 tablet Oral Daily Mercy Riding, MD   1 tablet at 06/22/19 1049  . oxyCODONE (Oxy IR/ROXICODONE) immediate release tablet 5 mg  5 mg Oral Q6H PRN Viona Gilmore D, NP   5 mg at 06/20/19 2352  . penicillin G potassium 12 Million Units in dextrose 5 % 500 mL continuous infusion  12 Million Units Intravenous Q12H Tommy Medal, Lavell Islam, MD 41.7 mL/hr at 06/22/19 1000 12 Million Units at 06/22/19 1000  . thiamine tablet 100 mg  100 mg Oral Daily  Wendee Beavers T, MD   100 mg at 06/22/19 1049     Discharge Medications: Please see discharge summary for a list of discharge medications.  Relevant Imaging Results:  Relevant Lab Results:   Additional Information SSN: [id number]/// Pt will need IV penicillin G 12 million units every 12 hours until 3/12. Will have a PICC line//  has neck brace for mild facet fractures  Pollie Friar, RN

## 2019-06-22 NOTE — TOC Progression Note (Signed)
Transition of Care [facility]) - Progression Note    Patient Details  Name: Theodore Brown MRN: [id number] Date of Birth: [birth year]  Transition of Care [facility]) CM/SW Contact  Kermit Balo, RN Phone Number: 06/22/2019, 11:12 AM  Clinical Narrative:    Pt faxed out for SNF since no support at home. Pt does also have polysubstance abuse history. Pt without insurance and will require a LOG from the hospital for SNF.  PICC planned for today.  TOC following.   Expected Discharge Plan: Skilled Nursing Facility Barriers to Discharge: Continued Medical Work up, Inadequate or no insurance, Unsafe home situation  Expected Discharge Plan and Services Expected Discharge Plan: Skilled Nursing Facility In-house Referral: Clinical Social Work Discharge Planning Services: CM Consult Post Acute Care Choice: Skilled Nursing Facility Living arrangements for the past 2 months: Single Family Home                                       Social Determinants of Health (SDOH) Interventions    Readmission Risk Interventions No flowsheet data found.

## 2019-06-22 NOTE — Progress Notes (Signed)
PROGRESS NOTE  Theodore Brown LZJ:[id number] DOB: [birth year] DOA: 06/16/2019 PCP: Loletta Specter, PA-C  Brief History   54 year old male with history of alcohol abuse, liver cirrhosis, osteoarthritis and prior neck surgery presenting with neck pain and bilateral upper extremity paresthesia after fall 2 days ago.  Reportedly fell backward and hit his head without loss of consciousness.  Tred to manage his neck pain at home before he presented to ED.  In ED, hypotensive to 86/58 and tachycardic to 122.  But improved with IV fluid.  Afebrile.  Normal saturation on room air.  Hgb 7 (baseline 10.2).  MCV 66.  WBC 23.2. Na 124.  K3.4.  Cr 1.34 (b/l 0.92).  EKG LVH with bilateral atrial enlargement.  CTA head and neck without acute finding but degenerative disc and facet disease.  CTA chest/abdomen/pelvis revealed cirrhotic liver with bulky abdominal varices, splenomegaly, trace ascites and chronic bilateral rib fractures.  MRI cervical spine ordered.  Patient was admitted for SIRS, acute encephalopathy, volume depletion, cervical radiculopathy and electrolyte derangement.  The next morning, blood cultures grew GPC in chains.  ID consulted.  Started on IV ceftriaxone and vancomycin.  Repeat blood culture 06/18/2019 is remain negative.  Is on high-dose penicillin.  ID has signed off and is recommended Rocephin for total of 4 weeks.  PICC line is being placed today.  Echocardiogram with normal EF and no vegetation.  MRI cervical spine revealed acute cervical spine injury with evidence of bilateral C2-C3 facet disruption, C5-C6 disc space disruption, blood or fluid within the spinal canal from C2-C3 to C6-C7 contributing to spinal stenosis with cord mass-effect but no definite spinal cord contusion or edema and bulky prevertebral blood or fluid suspicious for anterior ligamentous injury.  Neurosurgery consulted and recommended Aspen collar, conservative management and outpatient follow-up in 6 weeks for  flexion-extension C-spine x-rays.    The patient has been evaluated by ID. As we are not able to get TEE due to the patient's cervical injury, ID has recommended  4 weeks of penicillin with a stop date of 07/15/2019. IV infusion would be continue at SNF once discharged.  Consultants  . Infectious disease . Neurosurgery  Antibiotics   Anti-infectives (From admission, onward)   Start     Dose/Rate Route Frequency Ordered Stop   06/21/19 0800  penicillin G potassium 12 Million Units in dextrose 5 % 500 mL continuous infusion     12 Million Units 41.7 mL/hr over 12 Hours Intravenous Every 12 hours 06/20/19 0852     06/19/19 1800  vancomycin (VANCOREADY) IVPB 1250 mg/250 mL  Status:  Discontinued     1,250 mg 166.7 mL/hr over 90 Minutes Intravenous Every 12 hours 06/19/19 1722 06/20/19 0852   06/18/19 0600  vancomycin (VANCOREADY) IVPB 750 mg/150 mL  Status:  Discontinued     750 mg 150 mL/hr over 60 Minutes Intravenous Every 12 hours 06/17/19 1615 06/19/19 1722   06/17/19 1630  vancomycin (VANCOREADY) IVPB 1250 mg/250 mL     1,250 mg 166.7 mL/hr over 90 Minutes Intravenous  Once 06/17/19 1615 06/17/19 1837   06/17/19 0800  cefTRIAXone (ROCEPHIN) 2 g in sodium chloride 0.9 % 100 mL IVPB  Status:  Discontinued     2 g 200 mL/hr over 30 Minutes Intravenous Every 24 hours 06/17/19 0639 06/20/19 0852    .  Subjective  The patient is lying in bed complaining of pain all over his body. He is requesting narcotics for his pain.  Objective   Vitals:  Vitals:   06/22/19 1115 06/22/19 1656  BP: 132/76 (!) 142/74  Pulse: 85 82  Resp: 16 19  Temp: 98 F (36.7 C) 98.4 F (36.9 C)  SpO2: 100% 100%   Exam:  Constitutional:  . The patient is awake, alert, and oriented x 3. Mild distress from pain. Respiratory:  . No increased work of breathing. . No wheezes, rales, or rhonchi . No tactile fremitus Cardiovascular:  . Regular rate and rhythm . No murmurs, ectopy, or gallups. . No  lateral PMI. No thrills. Abdomen:  . Abdomen is soft, non-tender, non-distended . No hernias, masses, or organomegaly . Normoactive bowel sounds.  Musculoskeletal:  . No cyanosis, clubbing, or edema Skin:  . No rashes, lesions, ulcers . palpation of skin: no induration or nodules Neurologic:  . CN 2-12 intact . Sensation all 4 extremities intact Psychiatric:  . Mental status o Mood, affect appropriate o Orientation to person, place, time  . judgment and insight appear intact   I have personally reviewed the following:   Today's Data  . Vitals, BMP, CBC  Micro Data  . Streptococcus Group C from 2/2 blood cultures drawn on 06/16/2019. Sensitive to penicillin . Surveillance cultures drawn on 06/18/2019 have had no growth.  Imaging  . MRI C spine . CT C- Spine . CT head . CT chest  Scheduled Meds: . Chlorhexidine Gluconate Cloth  6 each Topical Daily  . ferrous sulfate  325 mg Oral BID WC  . folic acid  5 mg Oral Daily  . multivitamin with minerals  1 tablet Oral Daily  . sodium chloride flush  10-40 mL Intracatheter Q12H  . thiamine  100 mg Oral Daily   Continuous Infusions: . 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 Neurosurgery has been consulted and has recommended conservative care with aspen collar with 6 week follow up for repeat imaging. Continue pain control-Tylenol, Flexeril, oxycodone and fentanyl. The patient is on SCD for VTE prophylaxis in the setting of bleeding and thrombocytopenia. The patient has been evaluated by PT/OT. Interestingly, OT recommends SNF  but PT recommends outpatient PT. Due to the patient's need for extended IV antibiotics SNF is the more prudent disposition.  Severe sepsis due to Streptococcus bacteremia: Sepsis physiology improving.  Likely odontogenic source.  Denies IVDU.  TTE without significant finding or vegetation, unable to have TEE due to cervical fracture. Appreciate guidance by ID. Antibiotics were switched to high-dose penicillin G by ID on 06/20/2019.  They recommend continued high-dose penicillin at SNF if he goes to SNF.  Unable to rule out endocarditis as we are unable to obtain TEE due to cervical injuries. Recent blood culture from 06/17/2018 were negative.  PICC line was placed on 06/21/2019.  Alcoholic liver cirrhosis/varices/splenomegaly: Due to alcohol use disorder-admits drinking 3-4 beers a day. The patient has elevated liver enzyme/hyperbilirubinemia-likely due to alcohol.  CK, acute hepatitis panel and HIV negative. INR is also elevated at 1.4. His platelets are also low at 117 due to liver disease. They patient is on a CIWA protocol and he is receiving multivitamins and thiamine to supplement. He has been encouraged to  stop drinking. He does not seem interested.  Acute metabolic encephalopathy: Likely due to the above.  Resolved.  Acute kidney injury: Likely due to sepsis and dehydration.  Resolved.  Hyponatremia/hypophosphatemia: Likely due to alcohol abuse. Correct and monitor.  Hypokalemia/hypomagnesemia: Resolved.  Iron deficiency anemia: Baseline Hgb 8-9> 7.0 (admit) > 7.9>7.7>7.5 >7.6 >7.0>7.1> 7.5> 7.4.>7.9  Likely due to alcohol. LDH elevated. Haptoglobin pending. So far work-up indicates iron deficiency anemia. Has microcytosis as well. Continue oral ferrous sulfate. Monitor CBC and transfuse if hemoglobin drops less than 7.  Leukocytosis/bandemia: Likely due to bacteremia.  Improving. Monitor.  Thrombocytopenia: Likely due to alcohol.  About baseline. No signs of bleeding.  I have seen  and examined this patient myself. I have spent 32 minutes in his evaluation and care.   DVT prophylaxis: SCD due to epidural bleed and thrombocytopenia Code Status: Full code Family Communication: Discussed in length with patient. He is alert, oriented and competent. No family present.  Called patient's mother Simonne Martinet and discussed in length with her and answered several questions.  She was appreciative of the call. Discharge barrier:  PT OT recommends home health or SNF.  Reportedly, he has no power or gas in his home and his mother has other family members living with her so she is overwhelmed as well.  Per case manager, he has no insurance so cannot go to SNF.  Will need to be better than this in order to go home with home health and will also need IV antibiotics for 4 weeks per ID. Patient is from: Home Final disposition: Home with home health or SNF. Isahi Godwin, DO [facility] Direct contact: see [email or web address]  7PM-7AM contact night coverage as above 06/22/2019, 7:04 PM  LOS: 6 days

## 2019-06-23 LAB — CBC WITH DIFFERENTIAL/PLATELET
Abs Immature Granulocytes: 0.11 10*3/uL — ABNORMAL HIGH (ref 0.00–0.07)
Basophils Absolute: 0 10*3/uL (ref 0.0–0.1)
Basophils Relative: 0 %
Eosinophils Absolute: 0.1 10*3/uL (ref 0.0–0.5)
Eosinophils Relative: 1 %
HCT: 24.8 % — ABNORMAL LOW (ref 39.0–52.0)
Hemoglobin: 7.6 g/dL — ABNORMAL LOW (ref 13.0–17.0)
Immature Granulocytes: 1 %
Lymphocytes Relative: 12 %
Lymphs Abs: 1.1 10*3/uL (ref 0.7–4.0)
MCH: 22.6 pg — ABNORMAL LOW (ref 26.0–34.0)
MCHC: 30.6 g/dL (ref 30.0–36.0)
MCV: 73.6 fL — ABNORMAL LOW (ref 80.0–100.0)
Monocytes Absolute: 0.8 10*3/uL (ref 0.1–1.0)
Monocytes Relative: 9 %
Neutro Abs: 6.5 10*3/uL (ref 1.7–7.7)
Neutrophils Relative %: 77 %
Platelets: 118 10*3/uL — ABNORMAL LOW (ref 150–400)
RBC: 3.37 MIL/uL — ABNORMAL LOW (ref 4.22–5.81)
RDW: 31.8 % — ABNORMAL HIGH (ref 11.5–15.5)
WBC: 8.6 10*3/uL (ref 4.0–10.5)
nRBC: 0.2 % (ref 0.0–0.2)

## 2019-06-23 LAB — BASIC METABOLIC PANEL
Anion gap: 7 (ref 5–15)
BUN: 8 mg/dL (ref 6–20)
CO2: 20 mmol/L — ABNORMAL LOW (ref 22–32)
Calcium: 8.4 mg/dL — ABNORMAL LOW (ref 8.9–10.3)
Chloride: 105 mmol/L (ref 98–111)
Creatinine, Ser: 0.66 mg/dL (ref 0.61–1.24)
GFR calc Af Amer: >60 mL/min (ref >60)
GFR calc non Af Amer: >60 mL/min (ref >60)
Glucose, Bld: 101 mg/dL — ABNORMAL HIGH (ref 70–99)
Potassium: 4.2 mmol/L (ref 3.5–5.1)
Sodium: 132 mmol/L — ABNORMAL LOW (ref 135–145)

## 2019-06-23 LAB — CULTURE, BLOOD (ROUTINE X 2)
Culture: NO GROWTH
Culture: NO GROWTH
Special Requests: ADEQUATE
Special Requests: ADEQUATE

## 2019-06-23 NOTE — Progress Notes (Signed)
PROGRESS NOTE  Theodore Brown ZOX:[id number] DOB: 01-10-1966 DOA: 06/16/2019 PCP: Clent Demark, PA-C  Brief History   54 year old male with history of alcohol abuse, liver cirrhosis, osteoarthritis and prior neck surgery presenting with neck pain and bilateral upper extremity paresthesia after fall 2 days ago.  Reportedly fell backward and hit his head without loss of consciousness.  Tred to manage his neck pain at home before he presented to ED.  In ED, hypotensive to 86/58 and tachycardic to 122.  But improved with IV fluid.  Afebrile.  Normal saturation on room air.  Hgb 7 (baseline 10.2).  MCV 66.  WBC 23.2. Na 124.  K3.4.  Cr 1.34 (b/l 0.92).  EKG LVH with bilateral atrial enlargement.  CTA head and neck without acute finding but degenerative disc and facet disease.  CTA chest/abdomen/pelvis revealed cirrhotic liver with bulky abdominal varices, splenomegaly, trace ascites and chronic bilateral rib fractures.  MRI cervical spine ordered.  Patient was admitted for SIRS, acute encephalopathy, volume depletion, cervical radiculopathy and electrolyte derangement.  The next morning, blood cultures grew GPC in chains.  ID consulted.  Started on IV ceftriaxone and vancomycin.  Repeat blood culture 06/18/2019 is remain negative.  Is on high-dose penicillin.  ID has signed off and is recommended Rocephin for total of 4 weeks.  PICC line is being placed today.  Echocardiogram with normal EF and no vegetation.  MRI cervical spine revealed acute cervical spine injury with evidence of bilateral C2-C3 facet disruption, C5-C6 disc space disruption, blood or fluid within the spinal canal from C2-C3 to C6-C7 contributing to spinal stenosis with cord mass-effect but no definite spinal cord contusion or edema and bulky prevertebral blood or fluid suspicious for anterior ligamentous injury.  Neurosurgery consulted and recommended Aspen collar, conservative management and outpatient follow-up in 6 weeks for  flexion-extension C-spine x-rays.    The patient has been evaluated by ID. As we are not able to get TEE due to the patient's cervical injury, ID has recommended  4 weeks of penicillin with a stop date of 07/15/2019. IV infusion would be continue at SNF once discharged.  Consultants  . Infectious disease . Neurosurgery  Antibiotics   Anti-infectives (From admission, onward)   Start     Dose/Rate Route Frequency Ordered Stop   06/21/19 0800  penicillin G potassium 12 Million Units in dextrose 5 % 500 mL continuous infusion     12 Million Units 41.7 mL/hr over 12 Hours Intravenous Every 12 hours 06/20/19 0852     06/19/19 1800  vancomycin (VANCOREADY) IVPB 1250 mg/250 mL  Status:  Discontinued     1,250 mg 166.7 mL/hr over 90 Minutes Intravenous Every 12 hours 06/19/19 1722 06/20/19 0852   06/18/19 0600  vancomycin (VANCOREADY) IVPB 750 mg/150 mL  Status:  Discontinued     750 mg 150 mL/hr over 60 Minutes Intravenous Every 12 hours 06/17/19 1615 06/19/19 1722   06/17/19 1630  vancomycin (VANCOREADY) IVPB 1250 mg/250 mL     1,250 mg 166.7 mL/hr over 90 Minutes Intravenous  Once 06/17/19 1615 06/17/19 1837   06/17/19 0800  cefTRIAXone (ROCEPHIN) 2 g in sodium chloride 0.9 % 100 mL IVPB  Status:  Discontinued     2 g 200 mL/hr over 30 Minutes Intravenous Every 24 hours 06/17/19 0639 06/20/19 0852     Subjective  The patient is resting comfortably. No new complaints.  Objective   Vitals:  Vitals:   06/23/19 1228 06/23/19 1549  BP: (!) 125/59 115/63  Pulse: 91 90  Resp: 20 20  Temp: 98.7 F (37.1 C) 98.1 F (36.7 C)  SpO2: 100% 100%   Exam:  Constitutional:  . The patient is awake, alert, and oriented x 3. Mild distress from pain. Respiratory:  . No increased work of breathing. . No wheezes, rales, or rhonchi . No tactile fremitus Cardiovascular:  . Regular rate and rhythm . No murmurs, ectopy, or gallups. . No lateral PMI. No thrills. Abdomen:  . Abdomen is soft,  non-tender, non-distended . No hernias, masses, or organomegaly . Normoactive bowel sounds.  Musculoskeletal:  . No cyanosis, clubbing, or edema Skin:  . No rashes, lesions, ulcers . palpation of skin: no induration or nodules Neurologic:  . CN 2-12 intact . Sensation all 4 extremities intact Psychiatric:  . Mental status o Mood, affect appropriate o Orientation to person, place, time  . judgment and insight appear intact   I have personally reviewed the following:   Today's Data  . Vitals, BMP, CBC  Micro Data  . Streptococcus Group C from 2/2 blood cultures drawn on 06/16/2019. Sensitive to penicillin . Surveillance cultures drawn on 06/18/2019 have had no growth.  Imaging  . MRI C spine . CT C- Spine . CT head . CT chest  Scheduled Meds: . Chlorhexidine Gluconate Cloth  6 each Topical Daily  . ferrous sulfate  325 mg Oral BID WC  . folic acid  5 mg Oral Daily  . multivitamin with minerals  1 tablet Oral Daily  . sodium chloride flush  10-40 mL Intracatheter Q12H  . thiamine  100 mg Oral Daily   Continuous Infusions: . 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 Neurosurgery has been consulted and has recommended conservative care with aspen collar with 6 week follow up for repeat imaging. Continue pain control-Tylenol, Flexeril, oxycodone and fentanyl. The patient is on SCD for VTE prophylaxis in the setting of bleeding and thrombocytopenia. The patient has been evaluated by PT/OT. Interestingly, OT recommends SNF but PT recommends outpatient PT. Due to the patient's  need for extended IV antibiotics SNF is the more prudent disposition.  Severe sepsis due to Streptococcus bacteremia: Sepsis physiology improving.  Likely odontogenic source.  Denies IVDU.  TTE without significant finding or vegetation, unable to have TEE due to cervical fracture. Appreciate guidance by ID. Antibiotics were switched to high-dose penicillin G by ID on 06/20/2019.  They recommend continued high-dose penicillin at SNF if he goes to SNF.  Unable to rule out endocarditis as we are unable to obtain TEE due to cervical injuries. Recent blood culture from 06/17/2018 were negative.  PICC line was placed on 06/21/2019.  Alcoholic liver cirrhosis/varices/splenomegaly: Due to alcohol use disorder-admits drinking 3-4 beers a day. The patient has elevated liver enzyme/hyperbilirubinemia-likely due to alcohol.  CK, acute hepatitis panel and HIV negative. INR is also elevated at 1.4. His platelets are also low at 117 due to liver disease. They patient is on a CIWA protocol and he is receiving multivitamins and thiamine to supplement. He has been encouraged to stop drinking. He does not seem interested.  Acute metabolic encephalopathy: Likely due  to the above.  Resolved.  Acute kidney injury: Likely due to sepsis and dehydration.  Resolved.  Hyponatremia/hypophosphatemia: Likely due to alcohol abuse. Correct and monitor.  Hypokalemia/hypomagnesemia: Resolved.  Iron deficiency anemia: Baseline Hgb 8-9> 7.0 (admit) > 7.9>7.7>7.5 >7.6 >7.0>7.1> 7.5> 7.4.>7.9  Likely due to alcohol. LDH elevated. Haptoglobin pending. So far work-up indicates iron deficiency anemia. Has microcytosis as well. Continue oral ferrous sulfate. Monitor CBC and transfuse if hemoglobin drops less than 7.  Leukocytosis/bandemia: Likely due to bacteremia.  Improving. Monitor.  Thrombocytopenia: Likely due to alcohol.  About baseline. No signs of bleeding.  I have seen and examined this patient myself. I have spent 30  minutes in his evaluation and care.   DVT prophylaxis: SCD due to epidural bleed and thrombocytopenia Code Status: Full code Family Communication: Discussed in length with patient. He is alert, oriented and competent. No family present.  Called patient's mother Morton Stall and discussed in length with her and answered several questions.  She was appreciative of the call. Discharge barrier:  PT OT recommends home health or SNF.  Reportedly, he has no power or gas in his home and his mother has other family members living with her so she is overwhelmed as well.  Per case manager, he has no insurance so cannot go to SNF.  Will need to be better than this in order to go home with home health and will also need IV antibiotics for 4 weeks per ID. Patient is from: Home Final disposition: Home with home health or SNF. Kinan Safley, DO [facility] Direct contact: see [email or web address]  7PM-7AM contact night coverage as above 06/23/2019, 5:02 PM  LOS: 6 days

## 2019-06-23 NOTE — Plan of Care (Signed)

## 2019-06-24 MED ORDER — AMOXICILLIN 500 MG PO CAPS: 500.0000 mg | ORAL_CAPSULE | Freq: Three times a day (TID) | ORAL | 0 refills | Status: DC

## 2019-06-24 MED FILL — AMOXICILLIN 500 MG CAPS: 500 | 21 days supply | Qty: 63 | Fill #0

## 2019-06-24 NOTE — Progress Notes (Signed)
ID Pharmacy Progress Note   ID team informed of patient wanting to leave AMA. Original plan was for him to receive 4 weeks of IV antibiotics for his group C strep bacteremia since we were unable to obtain a TEE.   I spoke with Dr. Daiva Eves and we will arrange for the [facility] pharmacy to deliver 3 weeks worth of oral amoxicillin to take in place of IV penicillin to provide some treatment for the Group C Strep.     Sharin Mons, PharmD, BCPS, BCIDP Infectious Diseases Clinical Pharmacist Phone: [phone] 06/24/2019 9:56 AM

## 2019-06-24 NOTE — Progress Notes (Signed)
Bedside report complete. Patient sitting up in bed eating breakfast. Patient reports he will be leaving this morning, states that he feels like he is being held against his will. Allowed RN to complete assessment. Patient is AOX4, verbalizes understanding of risks of going home w/out completing treatment. Order to remove PICC line placed. MD notified and aware patient plan to leave when PICC line out. Will continue to treat/monitor while in hospital.

## 2019-06-24 NOTE — Progress Notes (Signed)
Pt wanting to leave AMA. Educated pt on importance of continued stay. Pt still wants to leave. States that he has things to do at home. Pt is A&O. MD on call made aware. MD to come to floor to speak with pt.   2230: MD came to floor to speak with pt. Pt was educated by provider regarding AMA and continuance to stay. Pt still wanted to leave. Provider spoke with pt's Mother regarding pt wanting to leave. Pt spoke with mother. Agreed to stay the night. States that he will leave in the AM.

## 2019-06-24 NOTE — Progress Notes (Signed)
Patient left ama. Signed form. Ambulated to main entrance. All belongings w/ patient.

## 2019-06-24 NOTE — Discharge Summary (Signed)
Physician Discharge Summary  Theodore Brown:[id number] DOB: 1965/12/19 DOA: 06/16/2019  PCP: Theodore Specter, PA-C  Admit date: 06/16/2019   Discharge date: 06/24/2019  Recommendations for Outpatient Follow-up:  1. Patient left AMA 2. TOC pharmacy in conjuction with Dr. Daiva Eves of infectious disease has made arrangements for the patient to receive 3 weeks of oral amoxicilllin to take in place of the total of 4 weeks of IV penicillin that was recommended by infectious disease for treatment of his Group C strep bacteremia (and possible endocarditis as we were unable to obtain TEE).   Discharge Diagnoses: Principal diagnosis is #1 1. 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 Cervical epidural hematoma 3. C-spine facet fracture 4. C5-C6 disc disruption 5. ETOH abuse 6. ETOH cirrhosis 7. ETOH hepatitis 8. Hyponatremia 9. Hypophosphatemia 10. Iron deficiency anemia  Discharge Condition: Pt left before he could be examined.  Disposition: AMA  Diet recommendation: AMA  Filed Weights   06/16/19 1228 06/17/19 0030  Weight: 63.5 kg 60.6 kg   History of present illness:  Patient is a 54 year old African-American male with past medical history significant for alcohol abuse, liver cirrhosis, arthritis and prior documented neck surgery.  Patient presents with 5-day history of neck pain with paresthesias of the bilateral upper extremities and 2 episodes of fall.  Patient denies prior falls.  Patient reports drinking mainly beer.  Patient only admits to drinking 3 beers daily.  Denies documented headache and confusion, however, patient is able to give good history.  Patient may have improved with aggressive hydration during the ER course.  No fever or chills, no chest pain, no shortness of breath, no GI symptoms and no urinary symptoms.  Patient denied hematochezia.  First fecal occult blood is negative, however, hemoglobin has dropped from 10.2 g/dL to 7 g/dL today.   WBC is 23.2 today, possibly reactive.  Sodium is 124, potassium is 3.4, BUN is 22, serum creatinine is 1.34 (up from 0.92).  MCV is 65.7.  Total protein is 6.6 with albumin of 2.1 and total bilirubin of 2.4.  UA revealed specific gravity of 1.019.  EKG reveals LVH with possible bilateral atrial enlargement.  Patient is volume depleted as well, and reports dryness of the tongue.  Presentation to the hospital, patient was significantly hypotensive and tachycardic.  Hypertension and tachycardia improved with aggressive hydration.  Medical team has been asked to admit patient for further assessment and management.  ED Course: On presentation to the ER, vitals revealed temperature of 98.3, blood pressure of 86-127/58-95 mmHg, heart rate of 91 to 122 bpm and respiratory rate of 14 to 33/min, with O2 sat of 99%.  Pertinent work-up and management is as documented above.  Hospital Course:  54 year old male with history of alcohol abuse, liver cirrhosis, osteoarthritis and prior neck surgery presenting with neck pain and bilateral upper extremity paresthesia after fall 2 days ago. Reportedly fell backward and hit his head without loss of consciousness. Tred to manage his neck pain at home before he presented to ED.  In ED, hypotensive to 86/58 and tachycardic to 122. But improved with IV fluid. Afebrile. Normal saturation on room air. Hgb 7 (baseline 10.2). MCV 66. WBC 23.2. Na 124. K3.4. Cr 1.34 (b/l 0.92). EKG LVH with bilateral atrial enlargement. CTA head and neck without acute finding but degenerative disc and facet disease. CTA chest/abdomen/pelvis revealed cirrhotic liver with bulky abdominal varices, splenomegaly, trace ascites and chronic bilateral rib fractures. MRI cervical spine ordered. Patient was admitted for  SIRS, acute encephalopathy, volume depletion, cervical radiculopathy and electrolyte derangement.  The next morning, blood cultures grew GPC in chains. ID consulted. Started  on IV ceftriaxone and vancomycin. Repeat blood culture 06/18/2019 is remain negative. Is on high-dose penicillin. ID has signed off and is recommended Rocephin for total of 4 weeks. PICC line is being placed today. Echocardiogram with normal EF and no vegetation.  MRI cervical spine revealed acute cervical spine injury with evidence of bilateral C2-C3 facet disruption, C5-C6 disc space disruption, blood or fluid within the spinal canal from C2-C3 to C6-C7 contributing to spinal stenosis with cord mass-effect but no definite spinal cord contusion or edema and bulky prevertebral blood or fluid suspicious for anterior ligamentous injury. Neurosurgery consulted and recommended Aspen collar, conservative management and outpatient follow-up in 6 weeks for flexion-extension C-spine x-rays.   The patient has been evaluated by ID. As we are not able to get TEE due to the patient's cervical injury, ID has recommended  4 weeks of penicillin with a stop date of 07/15/2019. IV infusion would be continue at SNF once discharged.  However, on the morning of 06/24/2019, the patient left AMA. [facility] and Dr. [name] of ID will have 3 weeks of oral amoxicillin delivered to the patient in lieu of the total of 4 weeks of penicillin that he was to have received if he had gone to SNF as recommended.  Today's assessment: S: Patient left before he could be seen. O: Vitals:  Vitals:   06/24/19 0456 06/24/19 0725  BP: 120/62 134/72  Pulse: (!) 110 (!) 121  Resp: 17 (!) 22  Temp: 98.6 F (37 C) 98.9 F (37.2 C)  SpO2: 100% 98%   Patient left before he could be examined. For last physical exam before the patient left AMA, please see progress note dated 06/23/2019.  Discharge Instructions  Patient left AMA.  Patient left AMA No Known Allergies  The results of significant diagnostics from this hospitalization (including imaging, microbiology, ancillary and laboratory) are listed  below for reference.    Significant Diagnostic Studies: CT Head Wo Contrast  Result Date: 06/16/2019 CLINICAL DATA:  Fall. EXAM: CT HEAD WITHOUT CONTRAST CT CERVICAL SPINE WITHOUT CONTRAST TECHNIQUE: Multidetector CT imaging of the head and cervical spine was performed following the standard protocol without intravenous contrast. Multiplanar CT image reconstructions of the cervical spine were also generated. COMPARISON:  03/03/2018 FINDINGS: CT HEAD FINDINGS Brain: No acute intracranial abnormality. Specifically, no hemorrhage, hydrocephalus, mass lesion, acute infarction, or significant intracranial injury. Vascular: No hyperdense vessel or unexpected calcification. Skull: No acute calvarial abnormality. Sinuses/Orbits: Visualized paranasal sinuses and mastoids clear. Orbital soft tissues unremarkable. Other: None CT CERVICAL SPINE FINDINGS Alignment: Anterolisthesis of C2 on C3 and C3 on C4 related to facet disease. Skull base and vertebrae: No acute fracture. No primary bone lesion or focal pathologic process. Soft tissues and spinal canal: No prevertebral fluid or swelling. No visible canal hematoma. Disc levels: Advanced degenerative disc disease from C3-4 through C6-7. Advanced diffuse bilateral degenerative facet disease. Upper chest: No acute findings Other: None IMPRESSION: No acute intracranial abnormality. Advanced degenerative disc and facet disease. No acute bony abnormality. Electronically Signed   By: Charlett NoseKevin  Dover M.D.   On: 06/16/2019 17:54   CT Chest W Contrast  Result Date: 06/16/2019 CLINICAL DATA:  [age] year old male status post fall. EXAM: CT CHEST, ABDOMEN, AND PELVIS WITH CONTRAST TECHNIQUE: Multidetector CT imaging of the chest, abdomen and pelvis was performed following the  standard protocol during bolus administration of intravenous contrast. CONTRAST:  OMNIPAQUE IOHEXOL 300 MG/ML  SOLN COMPARISON:  Cervical spine CT today reported separately FINDINGS: CT CHEST FINDINGS  Cardiovascular: Mild cardiac pulsation. The thoracic aorta appears intact. Borderline to mild cardiomegaly. No pericardial effusion. Calcified coronary artery atherosclerosis. Mediastinum/Nodes: Negative, no mediastinal hematoma or lymphadenopathy. Lungs/Pleura: Trace retained secretions in the trachea. Otherwise the major airways are patent. No pneumothorax. No pleural effusion. No pulmonary contusion. Dependent opacity most resembles atelectasis. Some areas of superimposed peripheral lung scarring are noted (left lingula series 4, image 80). Musculoskeletal: Normal thoracic segmentation. Thoracic vertebrae appear intact. Chronic right posterolateral 7th 8th and 10th rib fractures. Chronic left lateral 3rd through 10th rib fractures (9th rib chronically fractured in 2 places). No acute rib fracture identified. Sternum intact. CT ABDOMEN PELVIS FINDINGS Hepatobiliary: Nodular and cirrhotic liver. No discrete liver lesion. Negative gallbladder. Trace perihepatic free fluid. Pancreas: Negative. Spleen: Splenomegaly. No discrete splenic lesion. Adrenals/Urinary Tract: Normal adrenal glands. Bilateral renal enhancement and contrast excretion is symmetric and within normal limits. Decompressed proximal ureters. Mildly distended but otherwise unremarkable urinary bladder. Stomach/Bowel: No dilated large or small bowel. Evidence of a normal appendix on coronal image 74. Mostly decompressed stomach. No free air. Only trace perihepatic free fluid in the abdomen. Vascular/Lymphatic: Aortoiliac calcified atherosclerosis. Major arterial structures in the abdomen and pelvis are patent. No lymphadenopathy. There are bulky gastrosplenic varices. Bulky recanalized paraumbilical vein and ventral abdominal wall varices. Diminutive but patent main portal vein. Reproductive: Negative. Other: There is trace pelvic free fluid on series 9, image 108 with simple fluid density. Musculoskeletal: Normal lumbar segmentation. IMPRESSION: 1. No  acute traumatic injury identified in the chest, abdomen, or pelvis. 2. Cirrhotic Liver with bulky abdominal varices, splenomegaly, and trace ascites in the abdomen and pelvis. 3. Chronic bilateral rib fractures. 4. Aortic Atherosclerosis (ICD10-I70.0). Calcified coronary artery atherosclerosis. Electronically Signed   By: Odessa Fleming M.D.   On: 06/16/2019 19:02   CT Cervical Spine Wo Contrast  Result Date: 06/16/2019 CLINICAL DATA:  Fall. EXAM: CT HEAD WITHOUT CONTRAST CT CERVICAL SPINE WITHOUT CONTRAST TECHNIQUE: Multidetector CT imaging of the head and cervical spine was performed following the standard protocol without intravenous contrast. Multiplanar CT image reconstructions of the cervical spine were also generated. COMPARISON:  03/03/2018 FINDINGS: CT HEAD FINDINGS Brain: No acute intracranial abnormality. Specifically, no hemorrhage, hydrocephalus, mass lesion, acute infarction, or significant intracranial injury. Vascular: No hyperdense vessel or unexpected calcification. Skull: No acute calvarial abnormality. Sinuses/Orbits: Visualized paranasal sinuses and mastoids clear. Orbital soft tissues unremarkable. Other: None CT CERVICAL SPINE FINDINGS Alignment: Anterolisthesis of C2 on C3 and C3 on C4 related to facet disease. Skull base and vertebrae: No acute fracture. No primary bone lesion or focal pathologic process. Soft tissues and spinal canal: No prevertebral fluid or swelling. No visible canal hematoma. Disc levels: Advanced degenerative disc disease from C3-4 through C6-7. Advanced diffuse bilateral degenerative facet disease. Upper chest: No acute findings Other: None IMPRESSION: No acute intracranial abnormality. Advanced degenerative disc and facet disease. No acute bony abnormality. Electronically Signed   By: Charlett Nose M.D.   On: 06/16/2019 17:54   MR CERVICAL SPINE WO CONTRAST  Addendum Date: 06/17/2019   ADDENDUM REPORT: 06/17/2019 09:33 ADDENDUM: Study discussed by telephone with Dr.  Alanda Slim on 06/17/2019 at 0931 hours. He is contacting Neurosurgery. Electronically Signed   By: Odessa Fleming M.D.   On: 06/17/2019 09:33   Result Date: 06/17/2019 CLINICAL DATA:  54 year old male with multiple  recent falls. Severe neck pain. EXAM: MRI CERVICAL SPINE WITHOUT CONTRAST TECHNIQUE: Multiplanar, multisequence MR imaging of the cervical spine was performed. No intravenous contrast was administered. COMPARISON:  Cervical spine CT yesterday. FINDINGS: Alignment: Reversal of cervical lordosis and trace anterolisthesis of C2 on C3 is stable since yesterday. Vertebrae: There is marrow edema in the chronically degenerated right C2-C3 facet (series 7, image 3), and possible small acute fracture fragments from that facet are identified on series 10, image 23 yesterday. Associated abnormal fluid within that facet joint, and also the contralateral left C2-C3 facet. See series 8, image 13. No marrow edema identified elsewhere, but there is also conspicuous increased fluid signal in the C5-C6 disc space (series 7, image 11. Cord: Abnormal dorsal intraspinal blood or fluid tracking from C2-C3 to C6-C7 and contributes to spinal stenosis at those levels (series 5, image 8, series 8, image 23, series 10, image 14). The fluid appears to abate at the cervicothoracic junction. There is superimposed cervical spinal cord mass effect but no definite cord edema or intra-axial blood products. Posterior Fossa, vertebral arteries, paraspinal tissues: Cervicomedullary junction is within normal limits. Negative visible posterior fossa. Preserved major vascular flow voids in the neck. Bulky abnormal prevertebral fluid or edema along the entire ventral cervical spine, up to 13 mm in thickness (series 5, image 8 and series 8, image 17). No definite discontinuity of the anterior longitudinal ligament is identified. This also abates at the cervicothoracic junction. There is muscular edema around the right C2-C3 facet some, but no definite  interspinous ligament injury. No left-side muscle edema identified. Disc levels: C2-C3: Mild anterolisthesis with severe right facet degeneration. Both facets are abnormal as stated earlier. Disc bulging is superimposed on dorsal spinal canal blood or fluid. Mild spinal stenosis and spinal cord mass effect. Severe right C3 foraminal stenosis. C3-C4: Mild anterolisthesis with disc, endplate, and posterior element degeneration. Dorsal canal blood or fluid. Mild spinal stenosis and spinal cord mass effect. Moderate to severe bilateral C4 foraminal stenosis. C4-C5: Severe disc space loss with circumferential disc osteophyte complex. Dorsal bladder fluid. Spinal stenosis with mild spinal cord mass effect. Mild to moderate bilateral C5 foraminal stenosis. C5-C6: Abnormal signal within the disc space, underlying severe disc space loss with bulky anterior endplate osteophytes. Decreasing dorsal blood or fluid at this level. Mild spinal stenosis and spinal cord mass effect. Mild to moderate bilateral C6 foraminal stenosis. C6-C7: Disc space loss with circumferential disc osteophyte complex. Mild residual spinal canal blood or fluid. Mild spinal stenosis and spinal cord mass effect. Moderate bilateral C7 foraminal stenosis. C7-T1: Mild anterolisthesis and moderate facet hypertrophy. No spinal stenosis at this level. Mild to moderate bilateral C8 foraminal stenosis. No upper thoracic spinal stenosis. IMPRESSION: 1. 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 2. Evidence of a traumatic disruption of the bilateral C2-C3 facets (probably with small fracture fragments on the right), and suspected superimposed traumatic disruption of the C5-C6 disc space, although no definite fracture at that level. 3. Subsequent dorsal blood or fluid within the spinal canal from C2-C3 to C6-C7, contributing to spinal stenosis with cord mass effect at those levels. But no definite spinal cord contusion or edema.  4. Bulky prevertebral blood or fluid highly suspicious for anterior ligamentous injury, although no discrete ALL disruption is identified. 5. Recommend spine precautions and Neurosurgery consultation. Electronically Signed: By: Genevie Ann M.D. On: 06/17/2019 09:07   CT Abdomen Pelvis W Contrast  Result Date: 06/16/2019 CLINICAL DATA:  54 year old male status  post fall. EXAM: CT CHEST, ABDOMEN, AND PELVIS WITH CONTRAST TECHNIQUE: Multidetector CT imaging of the chest, abdomen and pelvis was performed following the standard protocol during bolus administration of intravenous contrast. CONTRAST:  OMNIPAQUE IOHEXOL 300 MG/ML  SOLN COMPARISON:  Cervical spine CT today reported separately FINDINGS: CT CHEST FINDINGS Cardiovascular: Mild cardiac pulsation. The thoracic aorta appears intact. Borderline to mild cardiomegaly. No pericardial effusion. Calcified coronary artery atherosclerosis. Mediastinum/Nodes: Negative, no mediastinal hematoma or lymphadenopathy. Lungs/Pleura: Trace retained secretions in the trachea. Otherwise the major airways are patent. No pneumothorax. No pleural effusion. No pulmonary contusion. Dependent opacity most resembles atelectasis. Some areas of superimposed peripheral lung scarring are noted (left lingula series 4, image 80). Musculoskeletal: Normal thoracic segmentation. Thoracic vertebrae appear intact. Chronic right posterolateral 7th 8th and 10th rib fractures. Chronic left lateral 3rd through 10th rib fractures (9th rib chronically fractured in 2 places). No acute rib fracture identified. Sternum intact. CT ABDOMEN PELVIS FINDINGS Hepatobiliary: Nodular and cirrhotic liver. No discrete liver lesion. Negative gallbladder. Trace perihepatic free fluid. Pancreas: Negative. Spleen: Splenomegaly. No discrete splenic lesion. Adrenals/Urinary Tract: Normal adrenal glands. Bilateral renal enhancement and contrast excretion is symmetric and within normal limits. Decompressed proximal  ureters. Mildly distended but otherwise unremarkable urinary bladder. Stomach/Bowel: No dilated large or small bowel. Evidence of a normal appendix on coronal image 74. Mostly decompressed stomach. No free air. Only trace perihepatic free fluid in the abdomen. Vascular/Lymphatic: Aortoiliac calcified atherosclerosis. Major arterial structures in the abdomen and pelvis are patent. No lymphadenopathy. There are bulky gastrosplenic varices. Bulky recanalized paraumbilical vein and ventral abdominal wall varices. Diminutive but patent main portal vein. Reproductive: Negative. Other: There is trace pelvic free fluid on series 9, image 108 with simple fluid density. Musculoskeletal: Normal lumbar segmentation. IMPRESSION: 1. No acute traumatic injury identified in the chest, abdomen, or pelvis. 2. Cirrhotic Liver with bulky abdominal varices, splenomegaly, and trace ascites in the abdomen and pelvis. 3. Chronic bilateral rib fractures. 4. Aortic Atherosclerosis (ICD10-I70.0). Calcified coronary artery atherosclerosis. Electronically Signed   By: Odessa Fleming M.D.   On: 06/16/2019 19:02   ECHOCARDIOGRAM COMPLETE  Result Date: 06/17/2019    ECHOCARDIOGRAM REPORT   Patient Name:   SANJUAN SAWA Date of Exam: 06/17/2019 Medical Rec #:  [id number]           Height:       67.0 in Accession #:    [id number]          Weight:       133.5 lb Date of Birth:  Jan 21, 1966            BSA:          1.70 m Patient Age:    53 years            BP:           122/58 mmHg Patient Gender: M                   HR:           96 bpm. Exam Location:  Inpatient Procedure: 2D Echo, Color Doppler and Cardiac Doppler Indications:    Bacteremia R78.81  History:        Patient has no prior history of Echocardiogram examinations.                 Signs/Symptoms:Bacteremia.  Sonographer:    Irving Burton Senior RDCS Referring Phys: [id number] Boyce Medici GONFA IMPRESSIONS  1. No obvious SBE if suspision  high can consider TEE.  2. Left ventricular ejection fraction, by  estimation, is 60 to 65%. The left ventricle has normal function. The left ventricle has no regional wall motion abnormalities. Left ventricular diastolic parameters were normal.  3. Right ventricular systolic function is normal. The right ventricular size is normal. There is moderately elevated pulmonary artery systolic pressure.  4. No subcostal imaging due to neck brace.  5. The mitral valve is normal in structure and function. Mild mitral valve regurgitation. No evidence of mitral stenosis.  6. The aortic valve is tricuspid. Aortic valve regurgitation is not visualized. No aortic stenosis is present. FINDINGS  Left Ventricle: Left ventricular ejection fraction, by estimation, is 60 to 65%. The left ventricle has normal function. The left ventricle has no regional wall motion abnormalities. The left ventricular internal cavity size was normal in size. There is  no left ventricular hypertrophy. Left ventricular diastolic parameters were normal. Right Ventricle: The right ventricular size is normal. No increase in right ventricular wall thickness. Right ventricular systolic function is normal. There is moderately elevated pulmonary artery systolic pressure. The tricuspid regurgitant velocity is 2.63 m/s, and with an assumed right atrial pressure of 15 mmHg, the estimated right ventricular systolic pressure is 42.7 mmHg. Left Atrium: Left atrial size was normal in size. Right Atrium: Right atrial size was normal in size. Pericardium: There is no evidence of pericardial effusion. Mitral Valve: The mitral valve is normal in structure and function. There is moderate thickening of the mitral valve leaflet(s). Normal mobility of the mitral valve leaflets. Mild mitral valve regurgitation. No evidence of mitral valve stenosis. There is  no evidence of mitral valve vegetation. Tricuspid Valve: The tricuspid valve is normal in structure. Tricuspid valve regurgitation is mild . No evidence of tricuspid stenosis. There is no  evidence of tricuspid valve vegetation. Aortic Valve: The aortic valve is tricuspid. Aortic valve regurgitation is not visualized. No aortic stenosis is present. Aortic valve mean gradient measures 9.0 mmHg. Aortic valve peak gradient measures 18.0 mmHg. Aortic valve area, by VTI measures 3.11  cm. There is no evidence of aortic valve vegetation. Pulmonic Valve: The pulmonic valve was normal in structure. Pulmonic valve regurgitation is trivial. No evidence of pulmonic stenosis. There is no evidence of pulmonic valve vegetation. Aorta: The aortic root is normal in size and structure. IAS/Shunts: The interatrial septum was not well visualized.  LEFT VENTRICLE PLAX 2D LVIDd:         5.00 cm   Diastology LVIDs:         3.10 cm   LV e' lateral:   10.40 cm/s LV PW:         1.10 cm   LV E/e' lateral: 7.6 LV IVS:        0.80 cm   LV e' medial:    13.20 cm/s LVOT diam:     2.10 cm   LV E/e' medial:  6.0 LV SV:         104.95 ml LV SV Index:   47.54 LVOT Area:     3.46 cm  RIGHT VENTRICLE RV S prime:     15.30 cm/s TAPSE (M-mode): 2.7 cm LEFT ATRIUM             Index       RIGHT ATRIUM           Index LA diam:        3.50 cm 2.06 cm/m  RA Area:     15.30 cm  LA Vol (A2C):   73.1 ml 42.92 ml/m RA Volume:   38.30 ml  22.49 ml/m LA Vol (A4C):   57.2 ml 33.59 ml/m LA Biplane Vol: 71.1 ml 41.75 ml/m  AORTIC VALVE AV Area (Vmax):    3.35 cm AV Area (Vmean):   3.19 cm AV Area (VTI):     3.11 cm AV Vmax:           212.00 cm/s AV Vmean:          140.000 cm/s AV VTI:            0.337 m AV Peak Grad:      18.0 mmHg AV Mean Grad:      9.0 mmHg LVOT Vmax:         205.00 cm/s LVOT Vmean:        129.000 cm/s LVOT VTI:          0.303 m LVOT/AV VTI ratio: 0.90  AORTA Ao Root diam: 3.10 cm Ao Asc diam:  2.90 cm MITRAL VALVE               TRICUSPID VALVE MV Area (PHT): 3.65 cm    TR Peak grad:   27.7 mmHg MV Decel Time: 208 msec    TR Vmax:        263.00 cm/s MV E velocity: 79.20 cm/s MV A velocity: 65.60 cm/s  SHUNTS MV E/A ratio:   1.21        Systemic VTI:  0.30 m                            Systemic Diam: 2.10 cm Charlton Haws MD Electronically signed by Charlton Haws MD Signature Date/Time: 06/17/2019/2:11:51 PM    Final    [place] EKG SITE RITE  Result Date: 06/21/2019 If Site Rite image not attached, placement could not be confirmed due to current cardiac rhythm.   Microbiology: Recent Results (from the past 240 hour(s))  Respiratory Panel by RT PCR (Flu A&B, Covid) - Nasopharyngeal Swab     Status: None   Collection Time: 06/16/19  2:07 PM   Specimen: Nasopharyngeal Swab  Result Value Ref Range Status   SARS Coronavirus 2 by RT PCR NEGATIVE NEGATIVE Final    Comment: (NOTE) SARS-CoV-2 target nucleic acids are NOT DETECTED. The SARS-CoV-2 RNA is generally detectable in upper respiratoy specimens during the acute phase of infection. The lowest concentration of SARS-CoV-2 viral copies this assay can detect is 131 copies/mL. A negative result does not preclude SARS-Cov-2 infection and should not be used as the sole basis for treatment or other patient management decisions. A negative result may occur with  improper specimen collection/handling, submission of specimen other than nasopharyngeal swab, presence of viral mutation(s) within the areas targeted by this assay, and inadequate number of viral copies (<131 copies/mL). A negative result must be combined with clinical observations, patient history, and epidemiological information. The expected result is Negative. Fact Sheet for Patients:  https://www.moore.com/ Fact Sheet for Healthcare Providers:  https://www.young.biz/ This test is not yet ap proved or cleared by the [place] FDA and  has been authorized for detection and/or diagnosis of SARS-CoV-2 by FDA under an Emergency Use Authorization (EUA). This EUA will remain  in effect (meaning this test can be used) for the duration of the COVID-19 declaration under  Section 564(b)(1) of the Act, 21 U.S.C. section 360bbb-3(b)(1), unless the authorization is terminated or revoked sooner.  Influenza A by PCR NEGATIVE NEGATIVE Final   Influenza B by PCR NEGATIVE NEGATIVE Final    Comment: (NOTE) The Xpert Xpress SARS-CoV-2/FLU/RSV assay is intended as an aid in  the diagnosis of influenza from Nasopharyngeal swab specimens and  should not be used as a sole basis for treatment. Nasal washings and  aspirates are unacceptable for Xpert Xpress SARS-CoV-2/FLU/RSV  testing. Fact Sheet for Patients: https://www.moore.com/ Fact Sheet for Healthcare Providers: https://www.young.biz/ This test is not yet approved or cleared by the [place] FDA and  has been authorized for detection and/or diagnosis of SARS-CoV-2 by  FDA under an Emergency Use Authorization (EUA). This EUA will remain  in effect (meaning this test can be used) for the duration of the  Covid-19 declaration under Section 564(b)(1) of the Act, 21  U.S.C. section 360bbb-3(b)(1), unless the authorization is  terminated or revoked. Performed at [facility], [address], [place], [place] [address]   Culture, blood (routine x 2)     Status: Abnormal   Collection Time: 06/16/19  3:50 PM   Specimen: BLOOD RIGHT ARM  Result Value Ref Range Status   Specimen Description BLOOD RIGHT ARM  Final   Special Requests   Final    BOTTLES DRAWN AEROBIC AND ANAEROBIC Blood Culture adequate volume   Culture  Setup Time   Final    GRAM POSITIVE COCCI IN CHAINS IN BOTH AEROBIC AND ANAEROBIC BOTTLES CRITICAL RESULT CALLED TO, READ BACK BY AND VERIFIED WITH: Lieutenant Diego [id number] 06/17/2019 Girtha Hake Performed at [facility], [address] [place], [place] [address]    Culture STREPTOCOCCUS GROUP C (A)  Final   Report Status 06/20/2019 FINAL  Final   Organism ID, Bacteria STREPTOCOCCUS GROUP C  Final      Susceptibility   Streptococcus group c -  MIC*    CLINDAMYCIN >=1 RESISTANT Resistant     AMPICILLIN <=0.25 SENSITIVE Sensitive     ERYTHROMYCIN >=8 RESISTANT Resistant     VANCOMYCIN 0.25 SENSITIVE Sensitive     CEFTRIAXONE <=0.12 SENSITIVE Sensitive     LEVOFLOXACIN <=0.25 SENSITIVE Sensitive     PENICILLIN Value in next row Sensitive      SENSITIVE0.06    * STREPTOCOCCUS GROUP C  Culture, blood (routine x 2)     Status: Abnormal   Collection Time: 06/16/19  3:57 PM   Specimen: BLOOD RIGHT ARM  Result Value Ref Range Status   Specimen Description BLOOD RIGHT ARM  Final   Special Requests   Final    BOTTLES DRAWN AEROBIC AND ANAEROBIC Blood Culture adequate volume   Culture  Setup Time   Final    GRAM POSITIVE COCCI IN CHAINS IN BOTH AEROBIC AND ANAEROBIC BOTTLES CRITICAL RESULT CALLED TO, READ BACK BY AND VERIFIED WITH: G. ABBOTT,PHARMD [id number] 06/17/2019 T. TYSOR    Culture (A)  Final    STREPTOCOCCUS GROUP C SUSCEPTIBILITIES PERFORMED ON PREVIOUS CULTURE WITHIN THE LAST 5 DAYS. Performed at [facility], [address] [place], [place] [address]    Report Status 06/20/2019 FINAL  Final  Blood Culture ID Panel (Reflexed)     Status: Abnormal   Collection Time: 06/16/19  3:57 PM  Result Value Ref Range Status   Enterococcus species NOT DETECTED NOT DETECTED Final   Listeria monocytogenes NOT DETECTED NOT DETECTED Final   Staphylococcus species NOT DETECTED NOT DETECTED Final   Staphylococcus aureus (BCID) NOT DETECTED NOT DETECTED Final   Streptococcus species DETECTED (  A) NOT DETECTED Final    Comment: Not Enterococcus species, Streptococcus agalactiae, Streptococcus pyogenes, or Streptococcus pneumoniae. CRITICAL RESULT CALLED TO, READ BACK BY AND VERIFIED WITH: G. ABBOTT,PHARMD [id number] 06/17/2019 T. TYSOR    Streptococcus agalactiae NOT DETECTED NOT DETECTED Final   Streptococcus pneumoniae NOT DETECTED NOT DETECTED Final   Streptococcus pyogenes NOT DETECTED NOT DETECTED Final   Acinetobacter baumannii NOT  DETECTED NOT DETECTED Final   Enterobacteriaceae species NOT DETECTED NOT DETECTED Final   Enterobacter cloacae complex NOT DETECTED NOT DETECTED Final   Escherichia coli NOT DETECTED NOT DETECTED Final   Klebsiella oxytoca NOT DETECTED NOT DETECTED Final   Klebsiella pneumoniae NOT DETECTED NOT DETECTED Final   Proteus species NOT DETECTED NOT DETECTED Final   Serratia marcescens NOT DETECTED NOT DETECTED Final   Haemophilus influenzae NOT DETECTED NOT DETECTED Final   Neisseria meningitidis NOT DETECTED NOT DETECTED Final   Pseudomonas aeruginosa NOT DETECTED NOT DETECTED Final   Candida albicans NOT DETECTED NOT DETECTED Final   Candida glabrata NOT DETECTED NOT DETECTED Final   Candida krusei NOT DETECTED NOT DETECTED Final   Candida parapsilosis NOT DETECTED NOT DETECTED Final   Candida tropicalis NOT DETECTED NOT DETECTED Final    Comment: Performed at [facility], [address], [place], [place] [address]  SARS CORONAVIRUS 2 (TAT 6-24 HRS) Nasopharyngeal Nasopharyngeal Swab     Status: None   Collection Time: 06/16/19  7:13 PM   Specimen: Nasopharyngeal Swab  Result Value Ref Range Status   SARS Coronavirus 2 NEGATIVE NEGATIVE Final    Comment: (NOTE) SARS-CoV-2 target nucleic acids are NOT DETECTED. The SARS-CoV-2 RNA is generally detectable in upper and lower respiratory specimens during the acute phase of infection. Negative results do not preclude SARS-CoV-2 infection, do not rule out co-infections with other pathogens, and should not be used as the sole basis for treatment or other patient management decisions. Negative results must be combined with clinical observations, patient history, and epidemiological information. The expected result is Negative. Fact Sheet for Patients: HairSlick.no Fact Sheet for Healthcare Providers: quierodirigir.com This test is not yet approved or cleared by the [place] FDA  and  has been authorized for detection and/or diagnosis of SARS-CoV-2 by FDA under an Emergency Use Authorization (EUA). This EUA will remain  in effect (meaning this test can be used) for the duration of the COVID-19 declaration under Section 56 4(b)(1) of the Act, 21 U.S.C. section 360bbb-3(b)(1), unless the authorization is terminated or revoked sooner. Performed at [facility], [address] [place], [place] [address]   Culture, blood (routine x 2)     Status: None   Collection Time: 06/18/19  8:19 AM   Specimen: BLOOD LEFT FOREARM  Result Value Ref Range Status   Specimen Description BLOOD LEFT FOREARM  Final   Special Requests   Final    BOTTLES DRAWN AEROBIC ONLY Blood Culture adequate volume   Culture   Final    NO GROWTH 5 DAYS Performed at [facility], [address], [place], [place] [address]    Report Status 06/23/2019 FINAL  Final  Culture, blood (routine x 2)     Status: None   Collection Time: 06/18/19  8:19 AM   Specimen: BLOOD  Result Value Ref Range Status   Specimen Description BLOOD LEFT ANTECUBITAL  Final   Special Requests   Final    BOTTLES DRAWN AEROBIC ONLY Blood Culture adequate volume   Culture  Final    NO GROWTH 5 DAYS Performed at [facility], [address] [place], [place] [address]    Report Status 06/23/2019 FINAL  Final     Labs: Basic Metabolic Panel: Recent Labs  Lab 06/18/19 0739 06/18/19 0739 06/19/19 0416 06/20/19 0455 06/21/19 0248 06/22/19 0228 06/23/19 0407  NA 127*   < > 130* 130* 130* 128* 132*  K 4.0   < > 3.2* 4.0 3.6 3.7 4.2  CL 97*   < > 99 101 103 102 105  CO2 21*   < > 21* 20* 19* 21* 20*  GLUCOSE 94   < > 93 83 120* 104* 101*  BUN 10   < > 9 7 7 9 8   CREATININE 0.80   < > 0.85 0.83 0.88 0.80 0.66  CALCIUM 7.8*   < > 7.9* 8.2* 8.2* 8.0* 8.4*  MG 1.9  --  1.9 1.8 1.7 1.9  --   PHOS 1.7*  1.6*  --  2.2* 2.4* 2.8 2.6  --    < > = values in this interval not displayed.   Liver Function  Tests: Recent Labs  Lab 06/18/19 0739 06/19/19 0416 06/20/19 0455 06/21/19 0248 06/22/19 0228  AST 97*  --   --   --   --   ALT 40  --   --   --   --   ALKPHOS 106  --   --   --   --   BILITOT 2.8*  --   --   --   --   PROT 6.0*  --   --   --   --   ALBUMIN 1.7*  1.7* 1.6* 1.6* 1.6* 1.7*   No results for input(s): LIPASE, AMYLASE in the last 168 hours. Recent Labs  Lab 06/17/19 2119  AMMONIA 33   CBC: Recent Labs  Lab 06/19/19 0416 06/19/19 1203 06/19/19 2133 06/20/19 0455 06/21/19 0248 06/22/19 0228 06/23/19 0407  WBC 17.7*  --   --  18.3* 16.1* 13.4* 8.6  NEUTROABS  --   --   --   --   --   --  6.5  HGB 7.0*   < > 7.5* 7.4* 7.5* 7.9* 7.6*  HCT 21.9*   < > 23.9* 24.6* 24.5* 26.5* 24.8*  MCV 66.2*  --   --  68.9* 69.6* 73.6* 73.6*  PLT 69*  --   --  100* 113* 117* 118*   < > = values in this interval not displayed.   Cardiac Enzymes: Recent Labs  Lab 06/18/19 0739  CKTOTAL 38*   BNP: BNP (last 3 results) No results for input(s): BNP in the last 8760 hours.  ProBNP (last 3 results) No results for input(s): PROBNP in the last 8760 hours.  CBG: No results for input(s): GLUCAP in the last 168 hours.  Principal Problem:   Bacteremia due to Streptococcus Active Problems:   SIRS (systemic inflammatory response syndrome) (HCC)   Alcoholic liver disease (HCC)   Injury of cervical spine (HCC)   Time coordinating discharge: 38 minutes  Signed:        Geoff Dacanay, DO [facility]  06/24/2019, 2:14 PM

## 2019-06-27 ENCOUNTER — Telehealth: Payer: Self-pay

## 2019-06-27 ENCOUNTER — Telehealth: Payer: Self-pay | Admitting: Physician Assistant

## 2019-06-27 NOTE — Telephone Encounter (Signed)
Transition Care Management Follow-up Telephone Call Date of discharge and from where: 06/24/2019, [facility]   Call placed to patient # [phone] ,phone rings without option for voicemail Call placed to # [phone], his mom answered and said that she gets all of his calls but he was at his place. She said she would have him return the call to this CM.  She had the number for the clinic.

## 2019-06-27 NOTE — Telephone Encounter (Signed)
Call returned to patient's mother, Talbert Forest # [phone], the voicemail was not set up, unable to leave a message.

## 2019-06-27 NOTE — Telephone Encounter (Signed)
Patients mother Talbert Forest requested for a call back from the care management coordinator regarding her son. Please follow up at your earliest convenience.

## 2019-06-28 ENCOUNTER — Telehealth: Payer: Self-pay

## 2019-06-28 NOTE — Telephone Encounter (Signed)
Transition Care Management Follow-up Telephone Call  His mother, Talbert Forest,  returned the call to this CM   Date of discharge and from where: 06/24/2019, [facility]  - left AMA  How have you been since you were released from the hospital? His mother stated that he has been unsteady when ambulating and she is concerned about him falling. She is also concerned that he is not able to care for himself. She said that she had his power turned back on and bought a space heater for him. Informed her that she can contact DSS or [employer] for possible assistance with [employer].  She checks on him during the day and said that she makes sure that he takes his antibiotic.    Any questions or concerns? She would like to know about his hospitalization and what he has been treated for and  was anxious to schedule an appointment for him. She said that she will make sure that he gets to his appointment on 07/01/2019.  She hopes to discuss further with the provider.   Has no insurance  His mother thinks that he may have forgotten to complete paperwork for the application.   Explained to her that [facility] has LCSW on staff as well as financial counseling.    Items Reviewed:  Did the pt receive and understand the discharge instructions provided? No, he left AMA  Medications obtained and verified? His mother said that he only has amoxicillin.  He left AMA  Any new allergies since your discharge?   None reported   Do you have support at home? he lives alone but his mother checks on him.  She said that she does not live far from him.    Other (ie: DME, Home Health, etc) no home health ordered. No DME.  Functional Questionnaire: (I = Independent and D = Dependent) ADL's:  Independent.  His mother stated that she checks on him frequently. She said that he tries to do things by himself but does not always make the best decisions.    Follow up appointments reviewed:     PCP Hospital f/u appt confirmed? Scheduled for appointment with Ms Randa Evens, NP  - 07/01/2019 @ 1050  Specialist Hospital f/u appt confirmed? None scheduled at this time   Are transportation arrangements needed? no, his mother drives  If their condition worsens, is the pt aware to call  their PCP or go to the ED? Yes, hi smother is aware  Was the patient provided with contact information for the PCP's office or ED? His mother has the clinic phone number

## 2019-07-01 ENCOUNTER — Ambulatory Visit (INDEPENDENT_AMBULATORY_CARE_PROVIDER_SITE_OTHER): Payer: Self-pay | Admitting: Primary Care

## 2019-07-04 ENCOUNTER — Ambulatory Visit (INDEPENDENT_AMBULATORY_CARE_PROVIDER_SITE_OTHER): Payer: Self-pay | Admitting: Primary Care

## 2019-07-11 ENCOUNTER — Ambulatory Visit (HOSPITAL_COMMUNITY)
Admission: EM | Admit: 2019-07-11 | Discharge: 2019-07-11 | Disposition: A | Discharge status: Home / Self Care | Payer: Self-pay | Attending: Family Medicine | Admitting: Family Medicine

## 2019-07-11 ENCOUNTER — Other Ambulatory Visit: Payer: Self-pay

## 2019-07-11 ENCOUNTER — Encounter (HOSPITAL_COMMUNITY): Payer: Self-pay | Admitting: *Deleted

## 2019-07-11 DIAGNOSIS — Z09 Encounter for follow-up examination after completed treatment for conditions other than malignant neoplasm: Secondary | ICD-10-CM

## 2019-07-11 DIAGNOSIS — M542 Cervicalgia: Secondary | ICD-10-CM

## 2019-07-11 DIAGNOSIS — R202 Paresthesia of skin: Secondary | ICD-10-CM

## 2019-07-11 DIAGNOSIS — F1010 Alcohol abuse, uncomplicated: Secondary | ICD-10-CM

## 2019-07-11 DIAGNOSIS — R200 Anesthesia of skin: Secondary | ICD-10-CM

## 2019-07-11 DIAGNOSIS — S14109D Unspecified injury at unspecified level of cervical spinal cord, subsequent encounter: Secondary | ICD-10-CM

## 2019-07-11 MED ORDER — HYDROCODONE-ACETAMINOPHEN 5-325 MG PO TABS: 1.0000 | ORAL_TABLET | Freq: Four times a day (QID) | ORAL | 0 refills | Status: DC | PRN

## 2019-07-11 MED ORDER — IBUPROFEN 600 MG PO TABS: 600.0000 mg | ORAL_TABLET | Freq: Four times a day (QID) | ORAL | 0 refills | Status: AC | PRN

## 2019-07-11 MED ORDER — AMOXICILLIN 500 MG PO CAPS: 500.0000 mg | ORAL_CAPSULE | Freq: Three times a day (TID) | ORAL | 0 refills | Status: AC

## 2019-07-11 NOTE — Discharge Instructions (Addendum)
Take the amoxicillin 3 x a day for 6 more days Take the ibuprofen every 6 hours with food Take the hydrocodone if pain is severe.  DO NOT TAKE WITH ANY ALCOHOL You should be on a vitamin with iron  You must call the health and wellness center TODAY to set up a primary care appointment You Need to see the neurosurgeon at 6 weeks after hospital See the infection doctor 3/29
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
## 2019-07-11 NOTE — ED Notes (Signed)
Aspen collar adjusted to fit properly.

## 2019-08-01 ENCOUNTER — Ambulatory Visit: Payer: Self-pay | Admitting: Infectious Disease

## 2019-08-04 ENCOUNTER — Inpatient Hospital Stay (INDEPENDENT_AMBULATORY_CARE_PROVIDER_SITE_OTHER): Payer: Self-pay | Admitting: Primary Care

## 2019-08-11 ENCOUNTER — Other Ambulatory Visit: Payer: Self-pay | Admitting: Student

## 2019-08-11 DIAGNOSIS — M4712 Other spondylosis with myelopathy, cervical region: Secondary | ICD-10-CM

## 2019-08-12 ENCOUNTER — Ambulatory Visit (HOSPITAL_COMMUNITY): Payer: MEDICAID

## 2019-08-16 ENCOUNTER — Other Ambulatory Visit: Payer: Self-pay | Admitting: Neurosurgery

## 2019-08-19 NOTE — Pre-Procedure Instructions (Signed)
    Theodore Brown  08/19/2019     Your procedure is scheduled on Wednesday, August 24, 2019   Report to [facility] Admitting at 6:30 A.M.  Call this number if you have problems the morning of surgery:  [phone]   Remember:   Do not eat or drink after midnight the night before surgery.    Take these medicines the morning of surgery with A SIP OF WATER :  If needed: acetaminophen (TYLENOL) for pain If needed: diphenhydrAMINE (BENADRYL) for allergies If needed: Carboxymethylcellul-Glycerin eye drops for dry eyes  Stop taking Aspirin (unless otherwise advised by surgeon), vitamins, fish oil , Elderberry, Milk Thistle and herbal medications. Do not take any NSAIDs ie: Ibuprofen, Advil, Naproxen (Aleve), Motrin, BC and Goody Powder; stop now.   Do not wear jewelry  Do not wear lotions, powders, or perfumes, or deodorant.  Do not shave 48 hours prior to surgery.  Men may shave face and neck.  Do not bring valuables to the hospital.  [facility] is not responsible for any belongings or valuables.  Contacts, dentures or bridgework may not be worn into surgery.  Leave your suitcase in the car.  After surgery it may be brought to your room. For patients admitted to the hospital, discharge time will be determined by your treatment team.  Special instructions: See " [facility] Preparing For Surgery " sheet.   Please read over the following fact sheets that you were given. Pain Booklet, Coughing and Deep Breathing and Surgical Site Infection Prevention

## 2019-08-22 ENCOUNTER — Encounter (HOSPITAL_COMMUNITY): Payer: Self-pay

## 2019-08-22 ENCOUNTER — Other Ambulatory Visit: Payer: Self-pay

## 2019-08-22 ENCOUNTER — Encounter (HOSPITAL_COMMUNITY)
Admission: RE | Admit: 2019-08-22 | Discharge: 2019-08-22 | Disposition: A | Discharge status: Home / Self Care | Payer: Self-pay | Source: Ambulatory Visit | Attending: Neurosurgery | Admitting: Neurosurgery

## 2019-08-22 ENCOUNTER — Other Ambulatory Visit (HOSPITAL_COMMUNITY): Payer: Self-pay

## 2019-08-22 DIAGNOSIS — Z01812 Encounter for preprocedural laboratory examination: Secondary | ICD-10-CM | POA: Insufficient documentation

## 2019-08-22 HISTORY — DX: Unspecified cirrhosis of liver: K74.60

## 2019-08-22 HISTORY — DX: Unspecified abdominal hernia without obstruction or gangrene: K46.9

## 2019-08-22 HISTORY — DX: Depression, unspecified: F32.A

## 2019-08-22 HISTORY — DX: Other spondylosis with myelopathy, cervical region: M47.12

## 2019-08-22 LAB — TYPE AND SCREEN
ABO/RH(D): O POS
Antibody Screen: NEGATIVE

## 2019-08-22 LAB — PROTIME-INR
INR: 1.2 (ref 0.8–1.2)
Prothrombin Time: 15.3 seconds — ABNORMAL HIGH (ref 11.4–15.2)

## 2019-08-22 LAB — CBC
HCT: 32.4 % — ABNORMAL LOW (ref 39.0–52.0)
Hemoglobin: 10.1 g/dL — ABNORMAL LOW (ref 13.0–17.0)
MCH: 27.4 pg (ref 26.0–34.0)
MCHC: 31.2 g/dL (ref 30.0–36.0)
MCV: 87.8 fL (ref 80.0–100.0)
Platelets: 105 10*3/uL — ABNORMAL LOW (ref 150–400)
RBC: 3.69 MIL/uL — ABNORMAL LOW (ref 4.22–5.81)
RDW: 18.1 % — ABNORMAL HIGH (ref 11.5–15.5)
WBC: 3.1 10*3/uL — ABNORMAL LOW (ref 4.0–10.5)
nRBC: 0 % (ref 0.0–0.2)

## 2019-08-22 LAB — COMPREHENSIVE METABOLIC PANEL
ALT: 22 U/L (ref 0–44)
AST: 51 U/L — ABNORMAL HIGH (ref 15–41)
Albumin: 2.6 g/dL — ABNORMAL LOW (ref 3.5–5.0)
Alkaline Phosphatase: 133 U/L — ABNORMAL HIGH (ref 38–126)
Anion gap: 9 (ref 5–15)
BUN: <5 mg/dL — ABNORMAL LOW (ref 6–20)
CO2: 26 mmol/L (ref 22–32)
Calcium: 9.4 mg/dL (ref 8.9–10.3)
Chloride: 103 mmol/L (ref 98–111)
Creatinine, Ser: 0.82 mg/dL (ref 0.61–1.24)
GFR calc Af Amer: >60 mL/min (ref >60)
GFR calc non Af Amer: >60 mL/min (ref >60)
Glucose, Bld: 103 mg/dL — ABNORMAL HIGH (ref 70–99)
Potassium: 3.6 mmol/L (ref 3.5–5.1)
Sodium: 138 mmol/L (ref 135–145)
Total Bilirubin: 1 mg/dL (ref 0.3–1.2)
Total Protein: 7.8 g/dL (ref 6.5–8.1)

## 2019-08-22 LAB — SURGICAL PCR SCREEN
MRSA, PCR: POSITIVE — AB
Staphylococcus aureus: POSITIVE — AB

## 2019-08-22 NOTE — Progress Notes (Signed)
Pt denies SOB, chest pain, and being under the care of a cardiologist and PCP.  Pt denies having a stress test and cardiac cath. Pt denies having a chest x ray in the last year. Pt denies recent labs. Pt reminded to quarantine. Pt verbalized understanding of all pre-op instructions. Pt chart forwarded to PA, Anesthesiology for review.

## 2019-08-23 ENCOUNTER — Encounter (HOSPITAL_COMMUNITY): Payer: Self-pay | Admitting: Physician Assistant

## 2019-08-23 ENCOUNTER — Other Ambulatory Visit (HOSPITAL_COMMUNITY)
Admission: RE | Admit: 2019-08-23 | Discharge: 2019-08-23 | Disposition: A | Discharge status: Home / Self Care | Payer: Self-pay | Source: Ambulatory Visit | Attending: Neurosurgery | Admitting: Neurosurgery

## 2019-08-23 ENCOUNTER — Encounter (HOSPITAL_COMMUNITY): Payer: Self-pay | Admitting: Certified Registered Nurse Anesthetist

## 2019-08-23 DIAGNOSIS — Z20822 Contact with and (suspected) exposure to covid-19: Secondary | ICD-10-CM | POA: Insufficient documentation

## 2019-08-23 DIAGNOSIS — Z01812 Encounter for preprocedural laboratory examination: Secondary | ICD-10-CM | POA: Insufficient documentation

## 2019-08-23 LAB — SARS CORONAVIRUS 2 (TAT 6-24 HRS): SARS Coronavirus 2: NEGATIVE

## 2019-08-23 NOTE — Anesthesia Preprocedure Evaluation (Deleted)
Anesthesia Evaluation    Airway        Dental   Pulmonary neg pulmonary ROS,           Cardiovascular negative cardio ROS       Neuro/Psych Cervical myelopathy and radiculopathy s/p injury    GI/Hepatic negative GI ROS, (+) Cirrhosis       ,   Endo/Other  negative endocrine ROS  Renal/GU negative Renal ROS     Musculoskeletal  (+) Arthritis ,   Abdominal   Peds  Hematology  (+) anemia ,   Anesthesia Other Findings   Reproductive/Obstetrics                              Lab Results  Component Value Date   WBC 3.1 (L) 08/22/2019   HGB 10.1 (L) 08/22/2019   HCT 32.4 (L) 08/22/2019   MCV 87.8 08/22/2019   PLT 105 (L) 08/22/2019   Lab Results  Component Value Date   CREATININE 0.82 08/22/2019   BUN <5 (L) 08/22/2019   NA 138 08/22/2019   K 3.6 08/22/2019   CL 103 08/22/2019   CO2 26 08/22/2019    Anesthesia Physical Anesthesia Plan  ASA: III  Anesthesia Plan: General   Post-op Pain Management:    Induction: Intravenous  PONV Risk Score and Plan: 2 and Dexamethasone, Ondansetron and Treatment may vary due to age or medical condition  Airway Management Planned: Oral ETT  Additional Equipment: Arterial line  Intra-op Plan:   Post-operative Plan: Extubation in OR and Possible Post-op intubation/ventilation  Informed Consent:   Plan Discussed with:   Anesthesia Plan Comments: (History of alcohol abuse, liver cirrhosis, osteoarthritis and prior neck surgery. He presented to [facility] 06/24/19 with neck pain and bilateral upper extremity paresthesia after fall 2 days prior. CTA chest/abdomen/pelvis revealed cirrhotic liver with bulky abdominal varices, splenomegaly, trace ascites and chronic bilateral rib fractures. Patient was admitted for SIRS, acute encephalopathy, volume depletion, cervical radiculopathy and electrolyte derangement. MRI cervical spine revealed acute cervical  spine injury with evidence of bilateral C2-C3 facet disruption, C5-C6 disc space disruption, blood or fluid within the spinal canal from C2-C3 to C6-C7 contributing to spinal stenosis with cord mass-effect but no definite spinal cord contusion or edema and bulky prevertebral blood or fluid suspicious for anterior ligamentous injury. Neurosurgery consulted and recommended Aspen collar, conservative management and outpatient follow-up in 6 weeks for flexion-extension C-spine x-rays.  He was also treated by ID for sepsis. Echocardiogram with normal EF and no vegetation. He left AMA and treatment for sepsis was completed outpatient with PO amoxicillin.   Preop labs reviewed. AST mildly elevated at 51. Mild anemia with Hgb 10.1 (up from 7.6 on 06/23/19). Mild thrombocytopenia with platelets 105k. Labs were called to Dr. Lovell Sheehan' surgery scheduler.  EKG 06/16/19: Sinus rhythm. Rate 89. LAE, LVH, ST elev, probable normal early repol pattern.   TTE 06/17/19: 1. No obvious SBE if suspision high can consider TEE.  2. Left ventricular ejection fraction, by estimation, is 60 to 65%. The  left ventricle has normal function. The left ventricle has no regional  wall motion abnormalities. Left ventricular diastolic parameters were  normal.  3. Right ventricular systolic function is normal. The right ventricular  size is normal. There is moderately elevated pulmonary artery systolic  pressure.  4. No subcostal imaging due to neck brace.  5. The mitral valve is normal in structure and function. Mild mitral  valve  regurgitation. No evidence of mitral stenosis.  6. The aortic valve is tricuspid. Aortic valve regurgitation is not  visualized. No aortic stenosis is present. )       Anesthesia Quick Evaluation

## 2019-08-23 NOTE — Progress Notes (Signed)
Anesthesia Chart Review:  History of alcohol abuse, liver cirrhosis, osteoarthritis and prior neck surgery. He presented to [place] 06/24/19 with neck pain and bilateral upper extremity paresthesia after fall 2 days prior. CTA chest/abdomen/pelvis revealed cirrhotic liver with bulky abdominal varices, splenomegaly, trace ascites and chronic bilateral rib fractures. Patient was admitted for SIRS, acute encephalopathy, volume depletion, cervical radiculopathy and electrolyte derangement. MRI cervical spine revealed acute cervical spine injury with evidence of bilateral C2-C3 facet disruption, C5-C6 disc space disruption, blood or fluid within the spinal canal from C2-C3 to C6-C7 contributing to spinal stenosis with cord mass-effect but no definite spinal cord contusion or edema and bulky prevertebral blood or fluid suspicious for anterior ligamentous injury. Neurosurgery consulted and recommended Aspen collar, conservative management and outpatient follow-up in 6 weeks for flexion-extension C-spine x-rays.  He was also treated by ID for sepsis. Echocardiogram with normal EF and no vegetation. He left AMA and treatment for sepsis was completed outpatient with PO amoxicillin.   Preop labs reviewed. AST mildly elevated at 51. Mild anemia with Hgb 10.1 (up from 7.6 on 06/23/19). Mild thrombocytopenia with platelets 105k. Labs were called to Dr. Lovell Sheehan' surgery scheduler.  EKG 06/16/19: Sinus rhythm. Rate 89. LAE, LVH, ST elev, probable normal early repol pattern.   TTE 06/17/19: 1. No obvious SBE if suspision high can consider TEE.  2. Left ventricular ejection fraction, by estimation, is 60 to 65%. The  left ventricle has normal function. The left ventricle has no regional  wall motion abnormalities. Left ventricular diastolic parameters were  normal.  3. Right ventricular systolic function is normal. The right ventricular  size is normal. There is moderately elevated pulmonary artery systolic  pressure.   4. No subcostal imaging due to neck brace.  5. The mitral valve is normal in structure and function. Mild mitral  valve regurgitation. No evidence of mitral stenosis.  6. The aortic valve is tricuspid. Aortic valve regurgitation is not  visualized. No aortic stenosis is present.    Zannie Cove [facility] Short Stay Center/Anesthesiology Phone [phone] 08/23/2019 10:21 AM

## 2019-08-24 ENCOUNTER — Inpatient Hospital Stay (HOSPITAL_COMMUNITY): Admission: RE | Admit: 2019-08-24 | Payer: Self-pay | Source: Home / Self Care | Admitting: Neurosurgery

## 2019-08-24 ENCOUNTER — Encounter (HOSPITAL_COMMUNITY): Admission: RE | Payer: Self-pay | Source: Home / Self Care

## 2019-08-24 SURGERY — ANTERIOR CERVICAL CORPECTOMY
Anesthesia: General

## 2019-08-24 NOTE — Progress Notes (Signed)
Patient called and spoke with Diplomatic Services operational officer.  Patient stated that he was too nervous to have surgery today and that he would call Dr. Lovell Sheehan' office.  He stated that he knew he needed to have the surgery and he wants to have it done but he was too scared today.  OR desk notified and stated they would call Dr. Lovell Sheehan.

## 2019-10-31 ENCOUNTER — Other Ambulatory Visit: Payer: Self-pay

## 2019-10-31 ENCOUNTER — Emergency Department (HOSPITAL_COMMUNITY)
Admission: EM | Admit: 2019-10-31 | Discharge: 2019-11-01 | Disposition: A | Discharge status: Home / Self Care | Payer: Self-pay | Attending: Emergency Medicine | Admitting: Emergency Medicine

## 2019-10-31 ENCOUNTER — Emergency Department (HOSPITAL_COMMUNITY): Payer: Self-pay

## 2019-10-31 ENCOUNTER — Encounter (HOSPITAL_COMMUNITY): Payer: Self-pay

## 2019-10-31 DIAGNOSIS — Y999 Unspecified external cause status: Secondary | ICD-10-CM | POA: Insufficient documentation

## 2019-10-31 DIAGNOSIS — Y9289 Other specified places as the place of occurrence of the external cause: Secondary | ICD-10-CM | POA: Insufficient documentation

## 2019-10-31 DIAGNOSIS — S8012XA Contusion of left lower leg, initial encounter: Secondary | ICD-10-CM | POA: Insufficient documentation

## 2019-10-31 DIAGNOSIS — M542 Cervicalgia: Secondary | ICD-10-CM | POA: Insufficient documentation

## 2019-10-31 DIAGNOSIS — M5090 Cervical disc disorder, unspecified, unspecified cervical region: Secondary | ICD-10-CM

## 2019-10-31 DIAGNOSIS — Y9389 Activity, other specified: Secondary | ICD-10-CM | POA: Insufficient documentation

## 2019-10-31 DIAGNOSIS — Y29XXXA Contact with blunt object, undetermined intent, initial encounter: Secondary | ICD-10-CM | POA: Insufficient documentation
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
## 2019-11-01 ENCOUNTER — Other Ambulatory Visit: Payer: Self-pay

## 2019-11-01 ENCOUNTER — Emergency Department (HOSPITAL_COMMUNITY): Payer: Self-pay
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
## 2019-11-01 NOTE — Discharge Instructions (Signed)
You have been evaluated for your leg injury.  You have a bruise in your leg which will improve over time.  Apply ice periodically throughout the day to help decrease swelling.  Follow instruction below.  X-ray of the cervical spine shows no significant changes from prior however, wear neck brace and follow-up with your neurosurgeon for further management.

## 2019-11-01 NOTE — Progress Notes (Signed)
Orthopedic Tech Progress Note Patient Details:  Theodore Brown [birth year] [id number]  Ortho Devices Type of Ortho Device: Soft collar Ortho Device/Splint Location: neck Ortho Device/Splint Interventions: Application, Ordered   Post Interventions Patient Tolerated: Well Instructions Provided: Care of device   Donald Pore 11/01/2019, 10:33 AM

## 2019-12-20 ENCOUNTER — Ambulatory Visit: Payer: Self-pay | Attending: Internal Medicine

## 2019-12-20 DIAGNOSIS — Z23 Encounter for immunization: Secondary | ICD-10-CM

## 2019-12-20 NOTE — Progress Notes (Signed)
   Covid-19 Vaccination Clinic  Name:  Theodore Brown    MRN: [id number] DOB: 12-Nov-1965  12/20/2019  Theodore Brown was observed post Covid-19 immunization for 15 minutes without incident. He was provided with Vaccine Information Sheet and instruction to access the V-Safe system.   Theodore Brown was instructed to call 911 with any severe reactions post vaccine: Marland Kitchen Difficulty breathing  . Swelling of face and throat  . A fast heartbeat  . A bad rash all over body  . Dizziness and weakness   Immunizations Administered    Name Date Dose VIS Date Route   Pfizer COVID-19 Vaccine 12/20/2019  4:51 PM 0.3 mL 06/29/2018 Intramuscular   Manufacturer: [employer], [employer]   Lot: [id number]   NDC: [address]-6

## 2020-01-08 ENCOUNTER — Other Ambulatory Visit: Payer: Self-pay

## 2020-01-08 ENCOUNTER — Emergency Department (HOSPITAL_COMMUNITY)
Admission: EM | Admit: 2020-01-08 | Discharge: 2020-01-08 | Disposition: A | Discharge status: Home / Self Care | Payer: Self-pay | Attending: Emergency Medicine | Admitting: Emergency Medicine

## 2020-01-08 ENCOUNTER — Emergency Department (HOSPITAL_COMMUNITY): Payer: Self-pay

## 2020-01-08 ENCOUNTER — Encounter (HOSPITAL_COMMUNITY): Payer: Self-pay | Admitting: Emergency Medicine

## 2020-01-08 DIAGNOSIS — Y999 Unspecified external cause status: Secondary | ICD-10-CM | POA: Insufficient documentation

## 2020-01-08 DIAGNOSIS — S8992XA Unspecified injury of left lower leg, initial encounter: Secondary | ICD-10-CM

## 2020-01-08 DIAGNOSIS — Y9389 Activity, other specified: Secondary | ICD-10-CM | POA: Insufficient documentation

## 2020-01-08 DIAGNOSIS — Y92410 Unspecified street and highway as the place of occurrence of the external cause: Secondary | ICD-10-CM | POA: Insufficient documentation

## 2020-01-08 DIAGNOSIS — S82115A Nondisplaced fracture of left tibial spine, initial encounter for closed fracture: Secondary | ICD-10-CM | POA: Insufficient documentation

## 2020-01-08 MED ORDER — HYDROCODONE-ACETAMINOPHEN 5-325 MG PO TABS: 1.0000 | ORAL_TABLET | Freq: Four times a day (QID) | ORAL | 0 refills | Status: AC | PRN

## 2020-01-08 NOTE — ED Notes (Signed)
Ortho tech paged  
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
## 2020-01-08 NOTE — Discharge Instructions (Signed)
Fracture care There is evidence of a fracture on the x-ray. Pain:  Antiinflammatory medications: Take 600 mg of ibuprofen every 6 hours or 440 mg (over the counter dose) to 500 mg (prescription dose) of naproxen every 12 hours for the next 3 days. After this time, these medications may be used as needed for pain. Take these medications with food to avoid upset stomach. Choose only one of these medications, do not take them together. Acetaminophen (generic for Tylenol): Should you continue to have additional pain while taking the ibuprofen or naproxen, you may add in acetaminophen as needed. Your daily total maximum amount of acetaminophen from all sources should be limited to 4000mg /day for persons without liver problems, or 2000mg /day for those with liver problems. Vicodin: May take Vicodin (hydrocodone-acetaminophen) as needed for severe pain.   Do not drive or perform other dangerous activities while taking this medication as it can cause drowsiness as well as changes in reaction time and judgement.   Please note that each pill of Vicodin contains 325 mg of acetaminophen (generic for Tylenol) and the above dosage limits apply. Ice: May apply ice to the injured area for no more than 15 minutes at a time to reduce swelling and pain. Elevation: Keep the extremity elevated whenever possible to reduce swelling and pain. Splint: Keep the knee immobilizer in place anytime you are not showering/bathing.  Use the crutches to be nonweightbearing until told otherwise by the orthopedic specialist. Follow-up: Follow-up with the orthopedic specialist for any further management of this issue.  Call the number provided to set up an appointment. Return: Return to the emergency department for severely increased pain, numbness, blanching of the skin, or any other major concerns.

## 2020-01-08 NOTE — Progress Notes (Signed)
Orthopedic Tech Progress Note Patient Details:  Theodore Brown 07-22-1965 [id number]  Ortho Devices Type of Ortho Device: Crutches, Knee Immobilizer Ortho Device/Splint Location: lle Ortho Device/Splint Interventions: Ordered, Application, Adjustment   Post Interventions Patient Tolerated: Well Instructions Provided: Care of device, Adjustment of device   Trinna Post 01/08/2020, 11:16 PM
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
## 2020-01-17 ENCOUNTER — Ambulatory Visit: Payer: Self-pay

## 2020-11-15 DIAGNOSIS — Z20822 Contact with and (suspected) exposure to covid-19: Secondary | ICD-10-CM | POA: Diagnosis not present
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
## 2020-11-27 DIAGNOSIS — Z20822 Contact with and (suspected) exposure to covid-19: Secondary | ICD-10-CM | POA: Diagnosis not present

## 2020-12-04 DIAGNOSIS — Z20822 Contact with and (suspected) exposure to covid-19: Secondary | ICD-10-CM | POA: Diagnosis not present

## 2020-12-11 DIAGNOSIS — Z20822 Contact with and (suspected) exposure to covid-19: Secondary | ICD-10-CM | POA: Diagnosis not present

## 2023-10-29 ENCOUNTER — Telehealth (INDEPENDENT_AMBULATORY_CARE_PROVIDER_SITE_OTHER): Payer: Self-pay | Admitting: Primary Care

## 2023-10-29 NOTE — Telephone Encounter (Signed)
 Called pt to make aware of appt. Pt phone is unavailable.

## 2023-10-30 ENCOUNTER — Telehealth (INDEPENDENT_AMBULATORY_CARE_PROVIDER_SITE_OTHER): Payer: Self-pay | Admitting: Primary Care

## 2023-10-30 ENCOUNTER — Encounter (INDEPENDENT_AMBULATORY_CARE_PROVIDER_SITE_OTHER): Admitting: Primary Care

## 2023-10-30 NOTE — Telephone Encounter (Signed)
 Called pt to reschedule missed appt. Pt's phone is unavailable.

## 2023-10-30 NOTE — Telephone Encounter (Signed)
 Called pt to reschedule appt. Pt did not answer and could not LVM.
